# Patient Record
Sex: Female | Born: 1985
Health system: Southern US, Community
[De-identification: ages and names within clinical notes are randomized; demographics above are authoritative.]

## PROBLEM LIST (undated history)

## (undated) ENCOUNTER — Inpatient Hospital Stay (HOSPITAL_COMMUNITY): Payer: Self-pay

## (undated) ENCOUNTER — Inpatient Hospital Stay (HOSPITAL_COMMUNITY): Payer: BC Managed Care – PPO

## (undated) DIAGNOSIS — F32A Depression, unspecified: Secondary | ICD-10-CM

## (undated) DIAGNOSIS — I499 Cardiac arrhythmia, unspecified: Secondary | ICD-10-CM

## (undated) DIAGNOSIS — R112 Nausea with vomiting, unspecified: Secondary | ICD-10-CM

## (undated) DIAGNOSIS — Z9889 Other specified postprocedural states: Secondary | ICD-10-CM

## (undated) DIAGNOSIS — I951 Orthostatic hypotension: Secondary | ICD-10-CM

## (undated) DIAGNOSIS — R000 Tachycardia, unspecified: Secondary | ICD-10-CM

## (undated) DIAGNOSIS — I498 Other specified cardiac arrhythmias: Secondary | ICD-10-CM

## (undated) DIAGNOSIS — Z87442 Personal history of urinary calculi: Secondary | ICD-10-CM

## (undated) DIAGNOSIS — R202 Paresthesia of skin: Secondary | ICD-10-CM

## (undated) DIAGNOSIS — Z8489 Family history of other specified conditions: Secondary | ICD-10-CM

## (undated) DIAGNOSIS — F329 Major depressive disorder, single episode, unspecified: Secondary | ICD-10-CM

## (undated) DIAGNOSIS — R51 Headache: Secondary | ICD-10-CM

## (undated) DIAGNOSIS — K219 Gastro-esophageal reflux disease without esophagitis: Secondary | ICD-10-CM

## (undated) DIAGNOSIS — N200 Calculus of kidney: Secondary | ICD-10-CM

## (undated) DIAGNOSIS — F419 Anxiety disorder, unspecified: Secondary | ICD-10-CM

## (undated) DIAGNOSIS — L570 Actinic keratosis: Secondary | ICD-10-CM

## (undated) DIAGNOSIS — G90A Postural orthostatic tachycardia syndrome (POTS): Secondary | ICD-10-CM

## (undated) DIAGNOSIS — B009 Herpesviral infection, unspecified: Secondary | ICD-10-CM

## (undated) HISTORY — DX: Anxiety disorder, unspecified: F41.9

## (undated) HISTORY — DX: Herpesviral infection, unspecified: B00.9

## (undated) HISTORY — DX: Orthostatic hypotension: I95.1

## (undated) HISTORY — DX: Depression, unspecified: F32.A

## (undated) HISTORY — PX: NO PAST SURGERIES: SHX2092

## (undated) HISTORY — DX: Actinic keratosis: L57.0

## (undated) HISTORY — DX: Tachycardia, unspecified: R00.0

## (undated) HISTORY — DX: Other specified cardiac arrhythmias: I49.8

## (undated) HISTORY — DX: Postural orthostatic tachycardia syndrome (POTS): G90.A

## (undated) HISTORY — PX: BREAST ENHANCEMENT SURGERY: SHX7

## (undated) HISTORY — DX: Headache: R51

## (undated) HISTORY — DX: Major depressive disorder, single episode, unspecified: F32.9

## (undated) HISTORY — DX: Calculus of kidney: N20.0

---
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
## 2006-01-23 ENCOUNTER — Other Ambulatory Visit: Admission: RE | Admit: 2006-01-23 | Discharge: 2006-01-23 | Payer: Self-pay | Admitting: Obstetrics and Gynecology

## 2006-07-13 ENCOUNTER — Inpatient Hospital Stay (HOSPITAL_COMMUNITY): Admission: AD | Admit: 2006-07-13 | Discharge: 2006-07-15 | Payer: Self-pay | Admitting: Obstetrics and Gynecology

## 2009-06-02 ENCOUNTER — Encounter: Payer: Self-pay | Admitting: Cardiology

## 2009-06-06 ENCOUNTER — Encounter: Payer: Self-pay | Admitting: Cardiology

## 2009-10-31 ENCOUNTER — Encounter: Payer: Self-pay | Admitting: Cardiology

## 2010-08-13 ENCOUNTER — Encounter: Payer: Self-pay | Admitting: Cardiology

## 2010-08-20 ENCOUNTER — Encounter: Payer: Self-pay | Admitting: Cardiology

## 2010-08-28 ENCOUNTER — Encounter: Payer: Self-pay | Admitting: Cardiology

## 2010-08-30 ENCOUNTER — Encounter: Payer: Self-pay | Admitting: Cardiology

## 2010-08-31 ENCOUNTER — Ambulatory Visit: Payer: Self-pay | Admitting: Cardiology

## 2010-08-31 DIAGNOSIS — R000 Tachycardia, unspecified: Secondary | ICD-10-CM | POA: Insufficient documentation

## 2010-09-04 ENCOUNTER — Encounter: Payer: Self-pay | Admitting: Cardiology

## 2010-09-14 ENCOUNTER — Ambulatory Visit (HOSPITAL_COMMUNITY)
Admission: RE | Admit: 2010-09-14 | Discharge: 2010-09-14 | Payer: Self-pay | Source: Home / Self Care | Attending: Cardiology | Admitting: Cardiology

## 2010-09-14 ENCOUNTER — Ambulatory Visit: Payer: Self-pay | Admitting: Cardiology

## 2010-09-14 ENCOUNTER — Ambulatory Visit: Payer: Self-pay

## 2010-09-14 ENCOUNTER — Encounter: Payer: Self-pay | Admitting: Cardiology

## 2010-09-14 DIAGNOSIS — I951 Orthostatic hypotension: Secondary | ICD-10-CM | POA: Insufficient documentation

## 2010-09-14 DIAGNOSIS — J069 Acute upper respiratory infection, unspecified: Secondary | ICD-10-CM | POA: Insufficient documentation

## 2010-10-15 ENCOUNTER — Encounter: Payer: Self-pay | Admitting: Cardiology

## 2010-10-17 ENCOUNTER — Ambulatory Visit
Admission: RE | Admit: 2010-10-17 | Discharge: 2010-10-17 | Payer: Self-pay | Source: Home / Self Care | Attending: Cardiology | Admitting: Cardiology

## 2010-10-30 NOTE — Letter (Signed)
Summary: [facility]  [facility]   Imported By: Marylou Mccoy 09/07/2010 17:01:39  _____________________________________________________________________  External Attachment:    Type:   Image     Comment:   External Document

## 2010-10-30 NOTE — Letter (Signed)
Summary: [name]  [facility]   Imported By: Marylou Mccoy 09/07/2010 17:02:07  _____________________________________________________________________  External Attachment:    Type:   Image     Comment:   External Document

## 2010-10-30 NOTE — Assessment & Plan Note (Signed)
Summary: np6/sinus tachycardia - appt is 1:45. gd   Visit Type:  Initial Consult Primary Zaniya Mcaulay:  Paulene Floor, NP  CC:  Tachycardia.  History of Present Illness: The patient presents for evaluation of tachycardia and fluctuating blood pressures.  She has no prior cardiac history. She has had a history of migraines and saw Dr. love a couple of years ago. She has had recurrence of headaches over the past few weeks with neck pain. She has seen a chiropractor and has had some manipulations. She's been told she might have compression fractures. She was given a test where she turned her head to the right and became quite dizzy. She has also had problems with resting tachycardia. Her heart rate at home has been in the 120s in the doctor's office 135.  I do have this EKG which confirms sinus tachycardia. I also had some strips from a monitor she's been wearing for 2 weeks which confirms sinus tachycardia. She has been having some lightheadedness and orthostatic symptoms. She has noticed a change in her heart rate with positions. She has been weak. She has had diaphoresis. She has had weight loss. She has had cold extremities. She has not described chest pressure, PND or orthopnea. I did review and called the office today to find out that TSH T4 and T3 done yesterday were normal.  Current Medications (verified): 1)  Flexeril 5 Mg Tabs (Cyclobenzaprine Hcl) .... At Bedtime 2)  Ibuprofen 800 Mg Tabs (Ibuprofen) .... As Needed  Allergies (verified): No Known Drug Allergies  Past History:  Past Medical History: Nephrolithiasis Concussion  Past Surgical History: calf none  Family History: Negative for early heart disease, sudden cardiac death, cardiomyopathy  Social History: Patient is married. She has 2 children. She occasionally smokes cigarettes and occasionally drinks alcohol.  Vital Signs:  Patient profile:   25 year old female Height:      62 inches Weight:      162 pounds BMI:      29.74 Pulse rate:   107 / minute Pulse (ortho):   119 / minute BP sitting:   112 / 74  (left arm) BP standing:   118 / 79 Cuff size:   regular  Vitals Entered By: Caralee Ates CMA (August 31, 2010 1:31 PM)  Serial Vital Signs/Assessments:  Time      Position  BP       Pulse  Resp  Temp     By           Lying RA  123/79   [address], CNA           Sitting   121/81   [address], CNA           Standing  118/79   [address] CNA 3 Min     Standing  112/75   119                   [address], CNA 5 Min     Standing  116/78   114                   [address]  [place], CNA  Comments: Lying- 'Room Spinning' Sitting- Lightheaded Standing- No complaints By: Marrion Coy, CNA  3 Min 3 Min Standing- No complaints By: Marrion Coy, CNA  5 Min 5 MIN- No comlaints By: Marrion Coy, CNA    Physical Exam  General:  Well developed, well nourished, in no acute distress. Head:  normocephalic and atraumatic Eyes:  PERRLA/EOM intact; conjunctiva and lids normal. Mouth:  Teeth, gums and palate normal. Oral mucosa normal. Neck:  Neck supple, no JVD. No masses, thyromegaly or abnormal cervical nodes. Chest Wall:  no deformities or breast masses noted Lungs:  Clear bilaterally to auscultation and percussion. Abdomen:  Bowel sounds positive; abdomen soft and non-tender without masses, organomegaly, or hernias noted. No hepatosplenomegaly. Msk:  Back normal, normal gait. Muscle strength and tone normal. Extremities:  No clubbing or cyanosis. Neurologic:  Alert and oriented x 3. Skin:  Intact without lesions or rashes. Cervical Nodes:  no significant adenopathy Axillary Nodes:  no significant adenopathy Inguinal Nodes:  no significant adenopathy Psych:  Normal affect.   Detailed Cardiovascular Exam  Neck    Carotids: Carotids full and equal bilaterally without bruits.      Neck Veins: Normal, no JVD.    Heart     Inspection: no deformities or lifts noted.      Palpation: normal PMI with no thrills palpable.      Auscultation: regular rate and rhythm, S1, S2 without murmurs, rubs, gallops, or clicks.    Vascular    Abdominal Aorta: no palpable masses, pulsations, or audible bruits.      Femoral Pulses: normal femoral pulses bilaterally.      Pedal Pulses: normal pedal pulses bilaterally.      Radial Pulses: normal radial pulses bilaterally.      Peripheral Circulation: no clubbing, cyanosis, or edema noted with normal capillary refill.     Impression & Recommendations:  Problem # 1:  UNSPECIFIED TACHYCARDIA (ICD-785.0) The patient may have postural orthostatic tachycardia syndrome. I have discussed first dietary changes to include increase salt and fluid. I'm going to check an echocardiogram. I will try to review all available rhythm strips at the end of the 21 day monitor. The next step would be a pregnancy test just to be sure that she has not q.d. I will also review other labs that hopefully will include electrolytes and a CBC. Provided she doesn't have significant improvement with dietary changes along with my next try fludrocortisone or beta blocker. Other medical therapies could be considered as well as tilt table testing. Orders: EKG w/ Interpretation (93000) Echocardiogram (Echo)  Patient Instructions: 1)  Your physician recommends that you schedule a follow-up appointment  with Dr Antoine Poche about 10 days 2)   Your physician recommends that you continue on your current medications as directed. Please refer to the Current Medication list given to you today. 3)  Your physician has requested that you have an echocardiogram.  Echocardiography is a painless test that uses sound waves to create images of your heart. It provides your doctor with information about the size and shape of your heart and how well your heart's chambers and valves are working.  This procedure takes approximately one hour.  There are no restrictions for this procedure. 4)  Increase salt and fluid intake.

## 2010-10-30 NOTE — Procedures (Signed)
Summary: Summary Report  Summary Report   Imported By: Erle Crocker 09/07/2010 15:01:00  _____________________________________________________________________  External Attachment:    Type:   Image     Comment:   External Document

## 2010-11-01 NOTE — Assessment & Plan Note (Signed)
Summary: [facility]   Visit Type:  Follow-up Primary Provider:  Paulene Floor, NP  CC:  Tachycardia.  History of Present Illness: The patient presents for followup of the above. At the last appointment I started her on fludrocortisone for treatment of postural orthostatic tachycardia. She is on a low dose of this and seems to be doing much better. Her heart rate today is much better. She has backed off on the fluids that she was drinking and still she is having less lightheadedness. Her heart rate was still race occasionally but not as frequently as previous. She has had no presyncope or syncope. She has had no chest pressure, neck or arm discomfort.  Current Medications (verified): 1)  Ibuprofen 800 Mg Tabs (Ibuprofen) .... As Needed 2)  Fludrocortisone Acetate 0.1 Mg Tabs (Fludrocortisone Acetate) .... One Daily  Allergies (verified): No Known Drug Allergies  Past History:  Past Medical History: Reviewed history from 08/31/2010 and no changes required. Nephrolithiasis Concussion  Review of Systems       As stated in the HPI and negative for all other systems.   Vital Signs:  Patient profile:   25 year old female Height:      62 inches Weight:      170 pounds BMI:     31.21 Pulse rate:   68 / minute Pulse (ortho):   76 / minute Resp:     16 per minute BP sitting:   122 / 68  (right arm) BP standing:   102 / 72  Vitals Entered By: Marrion Coy, CNA (October 17, 2010 1:17 PM)  Serial Vital Signs/Assessments:  Time      Position  BP       Pulse  Resp  Temp     By           Lying RA  102/72   66                    Marrion Coy, CNA           Sitting   108/78   [address] CNA           Standing  102/72   [address], CNA 3 min     Standing  96/64    80                    [address] CNA 5 Min     Standing  90/66    77                    [address], CNA  Comments: LYING- No complaints SITTING-  Lightheaded STANDING- No complaints By: Marrion Coy, CNA  3 min 3 Min standing- no complaints By: Marrion Coy, CNA  5 Min 5 Min standing- No complaints By: Marrion Coy, CNA    Physical Exam  General:  Well developed, well nourished, in no acute distress. Head:  normocephalic and atraumatic Eyes:  PERRLA/EOM intact; conjunctiva and lids normal. Mouth:  Teeth, gums and palate normal. Oral mucosa normal. Neck:  Neck supple, no JVD. No masses, thyromegaly or abnormal cervical nodes. Chest Wall:  no deformities or breast masses noted Lungs:  Clear bilaterally to auscultation and percussion. Abdomen:  Bowel sounds positive; abdomen soft and non-tender without masses, organomegaly, or hernias noted. No hepatosplenomegaly. Msk:  Back normal, normal gait. Muscle strength and tone normal. Extremities:  No clubbing or cyanosis. Neurologic:  Alert and oriented x 3. Skin:  Intact without lesions or rashes. Cervical Nodes:  no significant adenopathy Inguinal Nodes:  no significant adenopathy Psych:  Normal affect.   Detailed Cardiovascular Exam  Neck    Carotids: Carotids full and equal bilaterally without bruits.      Neck Veins: Normal, no JVD.    Heart    Inspection: no deformities or lifts noted.      Palpation: normal PMI with no thrills palpable.      Auscultation: regular rate and rhythm, S1, S2 without murmurs, rubs, gallops, or clicks.    Vascular    Abdominal Aorta: no palpable masses, pulsations, or audible bruits.      Femoral Pulses: normal femoral pulses bilaterally.      Pedal Pulses: normal pedal pulses bilaterally.      Radial Pulses: normal radial pulses bilaterally.      Peripheral Circulation: no clubbing, cyanosis, or edema noted with normal capillary refill.     Impression & Recommendations:  Problem # 1:  UNSPECIFIED TACHYCARDIA (ICD-785.0) The patient has repsonded well clincally to the low dose of the mineralocorticoid.  She will continue with  this.  She understands that this is a Class C medication with respect to pregnancy and she should let me know as soon as (if) she gets pregnant in the future.  Patient Instructions: 1)  Your physician recommends that you schedule a follow-up appointment in: 6 months with Dr Antoine Poche 2)  Your physician recommends that you continue on your current medications as directed. Please refer to the Current Medication list given to you today.

## 2010-11-01 NOTE — Miscellaneous (Signed)
  Clinical Lists Changes  Observations: Added new observation of ECHOINTERP: - Left ventricle: The cavity size was normal. Systolic function was       normal. The estimated ejection fraction was in the range of 60% to       65%. Wall motion was normal; there were no regional wall motion       abnormalities. Doppler parameters are consistent with abnormal       left ventricular relaxation (grade 1 diastolic dysfunction).     - Atrial septum: No defect or patent foramen ovale was identified. (09/14/2010 12:01)      Echocardiogram  Procedure date:  09/14/2010  Findings:      - Left ventricle: The cavity size was normal. Systolic function was       normal. The estimated ejection fraction was in the range of 60% to       65%. Wall motion was normal; there were no regional wall motion       abnormalities. Doppler parameters are consistent with abnormal       left ventricular relaxation (grade 1 diastolic dysfunction).     - Atrial septum: No defect or patent foramen ovale was identified.

## 2010-11-01 NOTE — Assessment & Plan Note (Signed)
Summary: F/U ECHO AT 3PM TODAY/PER DR [facility] HIMSELF ON 08-31-10 @ 3:...   Visit Type:  Follow-up Primary Provider:  Paulene Floor, NP  CC:  Tachycardia.  History of Present Illness: The patient presents for followup of symptoms consistent with postural orthostatic tachycardia syndrome. At the first visit she wore an event monitor.  Results of this are pending.  She had an echocardiogram today which demonstrates well-preserved ejection fraction with no significant valvular abnormalities. Blood work at her primary care office included a normal TSH and negative pregnancy test. Of note her liver enzymes have been mildly elevated and are being followed by her primary providers. Following our initial appointment I suggested increased salt and fluid intake and she has been doing this rigorously with a Gatorade.  She said this is helping with headaches and some of her lightheadedness. He still has some resting tachycardia. She is not having chest pressure, neck or arm discomfort. She is not having PND or orthopnea. His had no weight gain or edema.  Current Medications (verified): 1)  Ibuprofen 800 Mg Tabs (Ibuprofen) .... As Needed  Allergies (verified): No Known Drug Allergies  Past History:  Past Medical History: Reviewed history from 08/31/2010 and no changes required. Nephrolithiasis Concussion  Past Surgical History: None  Review of Systems       The patient has had an earache and sore throat for the past 3 - 4 days.   Vital Signs:  Patient profile:   25 year old female Height:      62 inches Weight:      161 pounds BMI:     29.55 Pulse rate:   90 / minute Resp:     16 per minute BP sitting:   132 / 68  (right arm)  Vitals Entered By: Marrion Coy, CNA (September 14, 2010 4:07 PM)  Physical Exam  General:  Well developed, well nourished, in no acute distress. Head:  normocephalic and atraumatic Ears:  Right TM slightly red and opacified. Mouth:  Teeth, gums and palate  normal. Oropharyngial erythema. Neck:  Neck supple, no JVD. No masses, thyromegaly or abnormal cervical nodes. Chest Wall:  no deformities or breast masses noted Lungs:  Clear bilaterally to auscultation and percussion. Abdomen:  Bowel sounds positive; abdomen soft and non-tender without masses, organomegaly, or hernias noted. No hepatosplenomegaly. Msk:  Back normal, normal gait. Muscle strength and tone normal. Extremities:  No clubbing or cyanosis. Neurologic:  Alert and oriented x 3. Skin:  Intact without lesions or rashes. Cervical Nodes:  no significant adenopathy Psych:  Normal affect.   Detailed Cardiovascular Exam  Neck    Carotids: Carotids full and equal bilaterally without bruits.      Neck Veins: Normal, no JVD.    Heart    Inspection: no deformities or lifts noted.      Palpation: normal PMI with no thrills palpable.      Auscultation: regular rate and rhythm, S1, S2 without murmurs, rubs, gallops, or clicks.    Vascular    Abdominal Aorta: no palpable masses, pulsations, or audible bruits.      Femoral Pulses: normal femoral pulses bilaterally.      Pedal Pulses: normal pedal pulses bilaterally.      Radial Pulses: normal radial pulses bilaterally.      Peripheral Circulation: no clubbing, cyanosis, or edema noted with normal capillary refill.     Impression & Recommendations:  Problem # 1:  ORTHOSTATIC HYPOTENSION (ICD-458.0) I believe the patient has POTS.  She  did respond favorably to increase hydration and salt. However, she is having drink significant quantities of Gatorade.  At this point I will start a low dose of Florinef and titrate upward as needed.  Problem # 2:  URI (ICD-465.9) She did have evidence of erythema of her TM and oropharynx. I prescribed a Z-Pak.  Patient Instructions: 1)  Your physician recommends that you schedule a follow-up appointment as needed  2)  Your physician has recommended you make the following change in your medication:  start Fludrocortizone 0.1 mg a day, take Z-pack once a day for 3 days Prescriptions: ZITHROMAX Z-PAK 250 MG TABS (AZITHROMYCIN) as directed  #1 x 0   Entered by:   Charolotte Capuchin, RN   Authorized by:   Rollene Rotunda, MD, [facility]   Signed by:   Charolotte Capuchin, RN on 09/14/2010   Method used:   Electronically to        [facility]  [address] (retail)       [address]       [place], [place]  [address]       Ph: [phone]       Fax: [phone]   RxID:   [id number] FLUDROCORTISONE ACETATE 0.1 MG TABS (FLUDROCORTISONE ACETATE) one daily  #30 x 11   Entered by:   Charolotte Capuchin, RN   Authorized by:   Rollene Rotunda, MD, [facility]   Signed by:   Charolotte Capuchin, RN on 09/14/2010   Method used:   Electronically to        [facility]  [address] (retail)       [address]       [place], [place]  [address]       Ph: [phone]       Fax: [phone]   RxID:   [id number]  I have reviewed and approved all prescriptions at the time of this visit. Rollene Rotunda, MD, [facility]  September 14, 2010 6:08 PM

## 2011-02-15 NOTE — Discharge Summary (Signed)
[name], [name] NO.:  [id number]   MEDICAL RECORD NO.:  [id number]          PATIENT TYPE:  INP   LOCATION:  9120                          FACILITY:  [facility]   PHYSICIAN:  Huel Cote, M.D. DATE OF BIRTH:  11-04-1985   DATE OF ADMISSION:  12/12/2004  DATE OF DISCHARGE:  12/14/2004                                 DISCHARGE SUMMARY   DISCHARGE DIAGNOSES:  1.  Term pregnancy at 39+ weeks, delivered.  2.  Status post normal spontaneous vaginal delivery.  3.  Status post nephrolithiasis in pregnancy.  4.  Herpes simplex virus during pregnancy, none at delivery.   DISCHARGE MEDICATIONS:  1.  Motrin 600 mg p.o. every six hours.  2.  Percocet one to two tablets p.o. every four hours p.r.n.   FOLLOW UP:  The patient is to follow up in six weeks for her routine  postpartum exam and determine birth control at that time.   HOSPITAL COURSE:  The patient is a 25 year old G1, P0 who was admitted at  39+ weeks with complaint of regular contractions.  She had documented  cervical change from 2 to 3 cm with observation and therefore was kept in  labor.  Her prenatal care had been complicated by recurrent nephrolithiasis  for which she was followed by Dr. Earlene Plater of urology.  Also, she had a  positive HSV-1 culture on her genitalia at 34 weeks; however, she was placed  on Valtrex and had no recurrent lesions since that time.   PRENATAL LABORATORY DATA:  O positive, antibody screen negative, rubella  immune, hepatitis B surface antigen negative, RPR nonreactive, HIV negative,  GC negative, Chlamydia negative, group B strep negative.  One-hour Glucola  147, three-hour Glucola normal.   PAST OBSTETRICAL HISTORY:  None.   PAST MEDICAL HISTORY:  Asthma.   PAST GYNECOLOGICAL HISTORY:  None.   PAST SURGICAL HISTORY:  None.   ALLERGIES:  NONE.   MEDICATIONS:  Valtrex.   HOSPITAL COURSE:  Upon admission, she was febrile with stable vital signs.  Fetal heart rate was  reassuring but had slightly decreased variability  secondary to receiving Stadol.  Upon my exam after admission, she was 90,  4+, and -1 station and had rupture of membranes performed at that time of  clear fluid.  She continued to progress quickly and reached complete  dilation and pushed well; however the fetal heart rate was noted to drop to  the 70s with pushing, and after five to six minutes, had very poor recovery  noted.  At this point, the vertex was at a +3 station; however, decision was  made to proceed with vacuum-assisted delivery, given the fetal heart  deceleration.  A soft-cup Mityvac was applied without difficulty, and the  vertex was pulled to crowning with one contraction.  The patient then pushed  out the remainder of the infant without difficulty.  There was a nuchal cord  x1 which reduced.  Apgars were 8 and 9.  Weight was 8 pounds even of a  vigorous female infant.  There was no difficult with delivery of the shoulders  or body.  The placenta delivered spontaneously.  The patient had a second-  degree laceration which was repaired with 2-0 Vicryl.  The cervix and rectum  were intact.  She then did well, and on postpartum day #2 was felt stable  for discharge home.      KR/MEDQ  D:  12/14/2004  T:  12/14/2004  Job:  [id number]

## 2011-02-15 NOTE — Discharge Summary (Signed)
NAMEWRENN, WILLCOX NO.:  [id number]   MEDICAL RECORD NO.:  [id number]          PATIENT TYPE:  INP   LOCATION:  9109                          FACILITY:  [facility]   PHYSICIAN:  Malachi Pro. Ambrose Mantle, M.D. DATE OF BIRTH:  02/06/1986   DATE OF ADMISSION:  09/17/2004  DATE OF DISCHARGE:                                 DISCHARGE SUMMARY   HOSPITAL COURSE:  A 25 year old white female at 27+ weeks gestation with [facility]  December 17, 2004 admitted to observation from the office where she presented  with severe right flank pain and some nausea; positive fetal movement; no  vaginal bleeding; no diarrhea or fever.  The patient had had two episodes of  similar pain within the past one-and-a-half months which were treated as  probable nephrolithiasis with pain medications and hydration, with the pain  resolving after 24-48 hours each time.  The patient stated the pain was  sharp in nature.  Her past medical history revealed asthma but it was  stable.  No surgical history or OB history, no GYN history.  Medications:  Percocet.  She was allergic to LATEX.  Physical exam on admission:  Normal  vital signs.  Heart normal size and sounds, lungs clear, abdomen gravid and  nontender, fundal height 27 cm, mild right CVA tenderness.  The patient was  noted to have an elevated white count of 16,000 and a creatinine of 1.2.  Dr. Senaida Ores admitted her to obtain a renal ultrasound and control her  pain.  The renal sonogram showed a right renal dilated pelvis.  Ureteral  jets were unable to be seen well secondary to an empty bladder.  On hospital  day #2 the patient complained of persistent pain as the medications would  wear off.  There was some nausea without emesis.  The patient stated her  pain was worse with movement and raising her legs.  Her white count  decreased from 16,200 to 12,000.  Creatinine remained at 1.2.  The patient  was seen in consultation by urology and her evaluation was that the  patient  probably had hydronephrosis of the right kidney secondary to a probable  stone.  She recommended managing the patient expectantly with pain  medicines, but if she became febrile or if the pain worsened, would probably  need to place a stent in the right ureter.  She suggested general surgical  consultation, but after talking with Dr. Earlene Plater and Dr. Senaida Ores, Dr.  Senaida Ores felt like the patient did not need a general surgical  consultation.  The ultrasound showed moderate pelvicaliceal dilatation on  the right with no associated ureterectasis.  This was markedly asymmetric  with the left side and more prominent that would be expected at [redacted] weeks  gestational age.  This was actually 27 weeks but is written as 24 weeks.  Evaluation for patency of the right ureter could not be undertaken at the  time of scanning due to incomplete bladder distention.  Dr. Kyung Rudd  recommended follow-up evaluation at a later point to assess for ureteral  jets, given today's scan findings.  LABORATORY DATA:  Showed a urinalysis without any hematuria.  It was straw  colored, a pH of 7.5, specific gravity of 1.015, negative for blood and  protein, had 15% ketones.  There were no leukocytes.  The patient's  comprehensive metabolic profile was normal except for creatinine of 1.2 that  was present on two determinations.  The white count on admission was 16,200;  it reduced to 12,000.  Hemoglobin 12.1 on admission, reduced to 10.9.   FINAL DIAGNOSES:  1.  Right flank pain, probably secondary to right nephrolithiasis.  2.  Intrauterine pregnancy at 27 weeks.   The patient is discharged on Percocet 5/325 #30 tablets one or two q.4-6h.  as needed for pain.  She is to follow up in our office in 1 week and also to  follow up with urology in 1 week.  If she becomes febrile or if she starts  having increasing pain, she is advised to call us for further evaluation.     Scharlene Corn   TFH/MEDQ  D:  09/18/2004  T:   09/19/2004  Job:  [id number]

## 2011-02-15 NOTE — Discharge Summary (Signed)
[name], [name]               ACCOUNT NO.:  [id number]   MEDICAL RECORD NO.:  [id number]          PATIENT TYPE:  INP   LOCATION:  9107                          [facility]ACILITY:  [facility]   PHYSICIAN:  Sherron Monday, MD        DATE OF BIRTH:  02-27-1986   DATE OF ADMISSION:  07/13/2006  DATE OF DISCHARGE:  07/15/2006                                 DISCHARGE SUMMARY   ADMITTING DIAGNOSIS:  Intrauterine pregnancy at term, laboring.   DISCHARGE DIAGNOSES:  Intrauterine pregnancy, delivered by spontaneous  vaginal delivery.   HISTORY OF PRESENT ILLNESS:  A 25 year old, G2, P1-0-0-1, at 77 and 4 with  contractions every 3-5 minutes at home.  Cervical change noted in MAU.  No  loss of fluid.  No vaginal bleeding.  Good fetal movement.   REVIEW OF SYSTEMS:  Negative for nausea, vomiting, negative for diarrhea or  constipation, negative for fever or chills, negative for headache.   PAST MEDICAL HISTORY:  Not significant.   PAST SURGICAL HISTORY:  Not significant.   OBSTETRICAL/GYNECOLOGIC HISTORY:  1. G1 is a term spontaneous vaginal delivery without complications.  2. G2 is at present.  3. No abnormal Pap smears.  4. She has a history of herpes.   MEDICATIONS:  She is taking Valtrex for suppression.   ALLERGIES:  NO KNOWN DRUG ALLERGIES, HOWEVER, SHE IS ALLERGIC TO LATEX.   SOCIAL HISTORY:  She denies any alcohol, tobacco, or drugs.  She is married  and works as a Lawyer.   FAMILY HISTORY:  Negative for diabetes or hypertension.  Positive for  pancreatic cancer in maternal grandfather.  No coronary artery disease.   PRENATAL LABORATORY:  Hemoglobin 13.3, platelets 319,000.  O positive.  CF  screen negative.  Gonorrhea negative.  Chlamydia negative.  RPR nonreactive.  Rubella immune.  Cystic fibrosis negative.  Hepatitis B surface antigen  negative.  Glucola 114.  Group B strep negative.  An ultrasound performed on  February 28, 2006, at 19 weeks confirmed an [facility] of July 23, 2006, normal  anatomy, and posterior placenta.   Artifical rupture of membranes was performed shortly after admission for  clear fluid.  Her labor was relatively uncomplicated.  She progressed to  complete complete +2 and pushed for  delivery a viable female infant at 12:33  with Apgar's of 8 at one minute and 9 at five minutes and a weight of 8  pounds 9 ounces.  The placenta was delivered intact over a second degree  perineal laceration as well as small periurethral laceration, were repaired  with 3-0 Vicryl in a normal fashion.  Her postpartum course was relatively  uncomplicated.  She remained afebrile with stable vital signs throughout.   She was discharged to home on postpartum day #2 with a prescription for  Motrin as well as prenatal vitamins.  She plans to breast feed.  She is O  positive.  Rubella immune.  And, we will get an IUD at her followup.  She  will follow up with [place] in approximately 6 weeks and then have her IUD placed  after  that.  She was discharged to home with appropriate discharge  instructions and numbers to call with any questions or problems.  She voiced  understanding of all this.      Sherron Monday, MD  Electronically Signed     JB/MEDQ  D:  07/15/2006  T:  07/15/2006  Job:  [id number]

## 2011-04-08 ENCOUNTER — Telehealth: Payer: Self-pay | Admitting: Cardiology

## 2011-04-08 NOTE — Telephone Encounter (Signed)
LMTC

## 2011-04-08 NOTE — Telephone Encounter (Signed)
Pt trying to get some information about a medication pt is taking, pt just found out she is pregnant.

## 2011-04-08 NOTE — Telephone Encounter (Signed)
Pt is expecting. Wants discuss medication.

## 2011-04-08 NOTE — Telephone Encounter (Signed)
Spoke with Kennon Rounds, pharmacist and she said that FLUDROCORTISONE ACETATE was a class C medication in [place] but was studied in [place] and is considered a class A (safe for pregnancy.) She is expecting and is wondering if she needs to stop the medication. Her OBGYN advised her to call us. Informed her to continue the medication, I will send to Dr. Antoine Poche to review.

## 2011-04-08 NOTE — Telephone Encounter (Signed)
Left message to call back  

## 2011-04-10 NOTE — Telephone Encounter (Signed)
I would suggest that she stop the medication and follow conservative measures for now such as fluid restriction and compression stockings.  We could reconsider the meds if she has severe, intractable symptoms.

## 2011-04-10 NOTE — Telephone Encounter (Signed)
Pt aware she should stop medication and wear compression stockings.  She should take her BP and HR if she feels dizziness etc.  She does state that she will not wear compression stockings but will call if problems.  An appointment was scheduled for her to follow up as she was due to be seen in July.

## 2011-04-18 ENCOUNTER — Encounter: Payer: Self-pay | Admitting: Cardiology

## 2011-04-22 ENCOUNTER — Encounter: Payer: Self-pay | Admitting: Cardiology

## 2011-04-22 ENCOUNTER — Ambulatory Visit (INDEPENDENT_AMBULATORY_CARE_PROVIDER_SITE_OTHER): Payer: BC Managed Care – PPO | Admitting: Cardiology

## 2011-04-22 VITALS — BP 116/83 | HR 81 | Ht 62.0 in | Wt 171.0 lb

## 2011-04-22 DIAGNOSIS — I951 Orthostatic hypotension: Secondary | ICD-10-CM

## 2011-04-22 NOTE — Assessment & Plan Note (Signed)
At this point I would like to avoid any additional medications if her symptoms can be managed during with pregnancy with volume and salt expansion.  We discussed this at length.  No change in therapy is needed.

## 2011-04-22 NOTE — Progress Notes (Signed)
HPI Patient presents for followup. Since I last saw her she has had no new symptoms. She actually been doing well taking fludrocortisone. However, she called yesterday saying that she was pregnant. I did discontinue this medication. She's had no severe symptoms though she had one brief bout of tachycardia while standing on vacation. She denies any chest pressure, neck or arm discomfort. She said no new shortness of breath, PND or orthopnea. She has had no new weight gain or edema.  No Known Allergies  Current Outpatient Prescriptions  Medication Sig Dispense Refill  . escitalopram (LEXAPRO) 10 MG tablet Take 10 mg by mouth daily.        Marland Kitchen ibuprofen (ADVIL,MOTRIN) 800 MG tablet Take 800 mg by mouth every 8 (eight) hours as needed.          Past Medical History  Diagnosis Date  . Nephrolithiasis   . Concussion     No past surgical history on file.  ROS:  As stated in the HPI and negative for all other systems.  PHYSICAL EXAM BP 116/83  Pulse 81  Ht 5\' 2"  (1.575 m)  Wt 171 lb (77.565 kg)  BMI 31.28 kg/m2 GENERAL:  Well appearing HEENT:  Pupils equal round and reactive, fundi not visualized, oral mucosa unremarkable NECK:  No jugular venous distention, waveform within normal limits, carotid upstroke brisk and symmetric, no bruits, no thyromegaly LYMPHATICS:  No cervical, inguinal adenopathy LUNGS:  Clear to auscultation bilaterally BACK:  No CVA tenderness CHEST:  Unremarkable HEART:  PMI not displaced or sustained,S1 and S2 within normal limits, no S3, no S4, no clicks, no rubs, no murmurs ABD:  Flat, positive bowel sounds normal in frequency in pitch, no bruits, no rebound, no guarding, no midline pulsatile mass, no hepatomegaly, no splenomegaly EXT:  2 plus pulses throughout, no edema, no cyanosis no clubbing SKIN:  No rashes no nodules NEURO:  Cranial nerves II through XII grossly intact, motor grossly intact throughout PSYCH:  Cognitively intact, oriented to person place and  time  ASSESSMENT AND PLAN

## 2011-04-22 NOTE — Patient Instructions (Signed)
Follow up in 4 months with Dr Antoine Poche

## 2011-04-22 NOTE — Progress Notes (Deleted)
   Patient ID: [name], female    DOB: 09-05-1986, 25 y.o.   MRN: [id number]  HPI    Review of Systems    Physical Exam

## 2011-05-07 LAB — HEPATITIS B SURFACE ANTIGEN: Hepatitis B Surface Ag: NEGATIVE

## 2011-05-07 LAB — GC/CHLAMYDIA PROBE AMP, GENITAL
Chlamydia: NEGATIVE
Gonorrhea: NEGATIVE

## 2011-05-07 LAB — ABO/RH: RH Type: POSITIVE

## 2011-05-07 LAB — ANTIBODY SCREEN: Antibody Screen: NEGATIVE

## 2011-05-07 LAB — RUBELLA ANTIBODY, IGM: Rubella: IMMUNE

## 2011-05-07 LAB — RPR: RPR: NONREACTIVE

## 2011-05-22 ENCOUNTER — Other Ambulatory Visit (HOSPITAL_COMMUNITY): Payer: Self-pay | Admitting: Obstetrics and Gynecology

## 2011-05-22 DIAGNOSIS — R1011 Right upper quadrant pain: Secondary | ICD-10-CM

## 2011-05-27 ENCOUNTER — Ambulatory Visit (HOSPITAL_COMMUNITY)
Admission: RE | Admit: 2011-05-27 | Discharge: 2011-05-27 | Disposition: A | Discharge status: Home / Self Care | Payer: BC Managed Care – PPO | Source: Ambulatory Visit | Attending: Obstetrics and Gynecology | Admitting: Obstetrics and Gynecology

## 2011-05-27 DIAGNOSIS — R1011 Right upper quadrant pain: Secondary | ICD-10-CM | POA: Insufficient documentation

## 2011-08-27 ENCOUNTER — Encounter: Payer: Self-pay | Admitting: Cardiology

## 2011-08-27 ENCOUNTER — Ambulatory Visit (INDEPENDENT_AMBULATORY_CARE_PROVIDER_SITE_OTHER): Payer: BC Managed Care – PPO | Admitting: Cardiology

## 2011-08-27 VITALS — BP 110/74 | HR 86 | Resp 18 | Ht 64.0 in | Wt 175.8 lb

## 2011-08-27 DIAGNOSIS — I951 Orthostatic hypotension: Secondary | ICD-10-CM

## 2011-08-27 NOTE — Progress Notes (Signed)
   HPI The patient returns for follow up of POTS.  He is now only [redacted] weeks pregnant with a little girl. She has had some palpitations. She's had a few episodes of lightheadedness. She will occasionally have a tachycardia when she stands. However, she's not had any syncope. She gets a little short of breath with activities but is not describing PND or orthopnea. She's had weight gain with pregnancy but no new edema.   No Known Allergies  Current Outpatient Prescriptions  Medication Sig Dispense Refill  . Multiple Vitamins-Minerals (MULTIVITAMIN WITH MINERALS) tablet Take 1 tablet by mouth daily.        Marland Kitchen omeprazole (PRILOSEC) 20 MG capsule Take 20 mg by mouth daily.        Marland Kitchen escitalopram (LEXAPRO) 10 MG tablet Take 10 mg by mouth daily.        Marland Kitchen ibuprofen (ADVIL,MOTRIN) 800 MG tablet Take 800 mg by mouth every 8 (eight) hours as needed.          Past Medical History  Diagnosis Date  . Nephrolithiasis   . Concussion     No past surgical history on file.  ROS:  As stated in the HPI and negative for all other systems.    PHYSICAL EXAM BP 110/74  Pulse 86  Resp 18  Ht 5\' 4"  (1.626 m)  Wt 175 lb 12.8 oz (79.742 kg)  BMI 30.18 kg/m2 GENERAL:  Well appearing NECK:  No jugular venous distention, waveform within normal limits, carotid upstroke brisk and symmetric, no bruits, no thyromegaly LYMPHATICS:  No cervical, inguinal adenopathy LUNGS:  Clear to auscultation bilaterally BACK:  No CVA tenderness CHEST:  Unremarkable HEART:  PMI not displaced or sustained,S1 and S2 within normal limits, no S3, no S4, no clicks, no rubs, no murmurs ABD:  Flat, positive bowel sounds normal in frequency in pitch, no bruits, no rebound, no guarding, no midline pulsatile mass, no hepatomegaly, no splenomegaly, gravid EXT:  2 plus pulses throughout, no edema, no cyanosis no clubbing SKIN:  No rashes no nodules NEURO:  Cranial nerves II through XII grossly intact, motor grossly intact throughout PSYCH:   Cognitively intact, oriented to person place and time  EKG:  Sinus rhythm, rate 87, axis within normal limits, intervals within normal limits, no acute ST-T wave changes.   ASSESSMENT AND PLAN

## 2011-08-27 NOTE — Patient Instructions (Signed)
The current medical regimen is effective;  continue present plan and medications.  Follow up in March 2012

## 2011-08-27 NOTE — Assessment & Plan Note (Signed)
No change in therapy.  She will stay hydrated and keep up her salt intake unless she has increased swelling or dyspnea.

## 2011-10-01 NOTE — L&D Delivery Note (Signed)
Delivery Note She received spinal anesthesia, epidural catheter infiltrated a blood vessel and was not placed.  She progressed to complete and pushed very well.  Ampicillin was in prior to delivery.  At 6:59 AM a viable female was delivered via Vaginal, Spontaneous Delivery (Presentation: Left Occiput Anterior).  APGAR: 9, 10; weight 7 lb 11 oz (3487 g).   Placenta status: Intact, Spontaneous.  Cord: 3 vessels with the following complications: None.   Anesthesia: Spinal  Episiotomy: None Lacerations: 2nd degree Suture Repair: 3.0 vicryl Est. Blood Loss (mL): 300  Mom to postpartum.  Baby to nursery-stable.  Rielly Corlett D 12/12/2011, 7:24 AM

## 2011-11-04 LAB — STREP B DNA PROBE: GBS: POSITIVE

## 2011-11-27 ENCOUNTER — Inpatient Hospital Stay (HOSPITAL_COMMUNITY)
Admission: AD | Admit: 2011-11-27 | Discharge: 2011-11-27 | Disposition: A | Discharge status: Home / Self Care | Payer: BC Managed Care – PPO | Attending: Obstetrics and Gynecology | Admitting: Obstetrics and Gynecology

## 2011-11-27 ENCOUNTER — Encounter (HOSPITAL_COMMUNITY): Payer: Self-pay | Admitting: *Deleted

## 2011-11-27 DIAGNOSIS — O4700 False labor before 37 completed weeks of gestation, unspecified trimester: Secondary | ICD-10-CM | POA: Insufficient documentation

## 2011-11-27 NOTE — Progress Notes (Signed)
Pt given option of ambien and dc home or ambulate and be rechecked.  Pt wishes to ambulated.  Explained waiting on reactive fetal strip.  Pt verbalized understanding.

## 2011-11-27 NOTE — Progress Notes (Signed)
Dr. Ellyn Hack notified of pt presenting for labor check.  Notified of VE 3/60/-2.  Pt may have option to have ambien and dc home, or ambulate for one hour and return to be rechecked.

## 2011-11-27 NOTE — Progress Notes (Signed)
Pt returned from walking at this time.  Monitors applied and assessing.

## 2011-11-27 NOTE — Discharge Instructions (Signed)
Return to MAU with any worsening symptoms.  Call your doctor with any concerns or questions.

## 2011-11-27 NOTE — Progress Notes (Signed)
Dr. Ellyn Hack notified of no cervical change after ambulating.  Ok to [employer] home.

## 2011-12-02 ENCOUNTER — Encounter: Payer: Self-pay | Admitting: Cardiology

## 2011-12-02 ENCOUNTER — Ambulatory Visit (INDEPENDENT_AMBULATORY_CARE_PROVIDER_SITE_OTHER): Payer: BC Managed Care – PPO | Admitting: Cardiology

## 2011-12-02 VITALS — BP 135/90 | HR 60 | Ht 62.0 in | Wt 187.0 lb

## 2011-12-02 DIAGNOSIS — R42 Dizziness and giddiness: Secondary | ICD-10-CM

## 2011-12-02 NOTE — Progress Notes (Signed)
    HPI The patient returns for follow up of POTS.  He is now only [redacted] weeks pregnant with a little girl. She has had some palpitations. She's had a few episodes of lightheadedness.  However, she has had no frank syncope or presyncope. She's not having any chest pressure, neck or arm discomfort. Her pregnancy has been unremarkable. She has had a little more lower extremity edema and she had with other pregnancies however.  No Known Allergies  Current Outpatient Prescriptions  Medication Sig Dispense Refill  . omeprazole (PRILOSEC) 20 MG capsule Take 20 mg by mouth daily.       . ValACYclovir HCl (VALTREX PO) Take 1 tablet by mouth daily.        Past Medical History  Diagnosis Date  . Nephrolithiasis   . Concussion   . Asthma     Past Surgical History  Procedure Date  . No past surgeries     ROS:  As stated in the HPI and negative for all other systems.    PHYSICAL EXAM BP 135/90  Pulse 60  Ht 5\' 2"  (1.575 m)  Wt 187 lb (84.823 kg)  BMI 34.20 kg/m2 GENERAL:  Well appearing NECK:  No jugular venous distention, waveform within normal limits, carotid upstroke brisk and symmetric, no bruits, no thyromegaly LYMPHATICS:  No cervical, inguinal adenopathy LUNGS:  Clear to auscultation bilaterally BACK:  No CVA tenderness CHEST:  Unremarkable HEART:  PMI not displaced or sustained,S1 and S2 within normal limits, no S3, no S4, no clicks, no rubs, no murmurs ABD:  Flat, positive bowel sounds normal in frequency in pitch, no bruits, no rebound, no guarding, no midline pulsatile mass, no hepatomegaly, no splenomegaly, gravid EXT:  2 plus pulses throughout, traceedema, no cyanosis no clubbing   EKG:  Sinus rhythm, rate 60, axis within normal limits, intervals within normal limits, no acute ST-T wave changes. 12/02/2011   ASSESSMENT AND PLAN

## 2011-12-02 NOTE — Patient Instructions (Signed)
The current medical regimen is effective;  continue present plan and medications.  Follow up in 4 months with Dr Hochrein 

## 2011-12-02 NOTE — Assessment & Plan Note (Signed)
She has had some very mild lightheadedness but no severe symptoms. No change in therapy is indicated.

## 2011-12-02 NOTE — Assessment & Plan Note (Signed)
Her tachycardia seems to be slightly improved. She's had no new symptoms. No change in therapy is indicated.

## 2011-12-10 ENCOUNTER — Telehealth (HOSPITAL_COMMUNITY): Payer: Self-pay | Admitting: *Deleted

## 2011-12-10 ENCOUNTER — Encounter (HOSPITAL_COMMUNITY): Payer: Self-pay | Admitting: *Deleted

## 2011-12-10 NOTE — Telephone Encounter (Signed)
Preadmission screen  

## 2011-12-11 ENCOUNTER — Other Ambulatory Visit: Payer: Self-pay | Admitting: Obstetrics and Gynecology

## 2011-12-11 NOTE — H&P (Signed)
  25yo [id number] at 39+ for induction of labor given term status and favorable cervix.  +FM, no LOF, no VB, occ ctx; Pregnancy complicated by +GBBS and POTs, elevated glucola, nl 3 hr gtt.    PMH Kidney stones Migraines Postural Orthostatic Tachycardia HSV 1  PSH none POBGynHx G3P2002 G1 TSVD 8# vacuum assisted, female G22 TSVD 8#9 female G3 present No abnormal pap, no STDs   Meds Valtrex  All NKDA   Latex sens  SH denies ETOH, tobacco or drug use, married  FH Arthritis,Breast Cancer, brain cancer, COPD, CVA, DM, HTN, liver Ca, Migraines, Pancreatic Ca, thyroid disease  PE AFVSS gen NAD CV RRR Lungs CTAB Abd soft, FNT Ext sym, NT  SVE 4cm  O+/Ab Scr neg/Hgb 14.4/Pap WNL/ RI/ RPR NR/Ur Cx + GBBS/ HepBsAG neg/ HIV neg/ Plt 309K/ GC neg/ Chl neg/First Tri Scr and AFP WNL/ CF neg/ glucola 138 - 3hr failed 1 hr, o/w WNL/   [place] dated by 7wk [place] [facility] 3/21 Anat [place] cwd, nl anat, ant plac, female  A&P 25yo G3P2002 at 39+ for IOL given favorable cervix and term status GBBS +, PCN for prophylaxis Epidural prn Pitocin and AROM to augment Expect SVD

## 2011-12-12 ENCOUNTER — Inpatient Hospital Stay (HOSPITAL_COMMUNITY)
Admission: RE | Admit: 2011-12-12 | Discharge: 2011-12-14 | DRG: 775 | Disposition: A | Discharge status: Home / Self Care | Payer: Medicaid Other | Source: Ambulatory Visit | Attending: Obstetrics and Gynecology | Admitting: Obstetrics and Gynecology

## 2011-12-12 ENCOUNTER — Inpatient Hospital Stay (HOSPITAL_COMMUNITY): Payer: Medicaid Other | Admitting: Anesthesiology

## 2011-12-12 ENCOUNTER — Encounter (HOSPITAL_COMMUNITY): Payer: Self-pay

## 2011-12-12 ENCOUNTER — Encounter (HOSPITAL_COMMUNITY): Payer: Self-pay | Admitting: Anesthesiology

## 2011-12-12 DIAGNOSIS — Z22330 Carrier of Group B streptococcus: Secondary | ICD-10-CM

## 2011-12-12 DIAGNOSIS — IMO0001 Reserved for inherently not codable concepts without codable children: Secondary | ICD-10-CM

## 2011-12-12 DIAGNOSIS — O99892 Other specified diseases and conditions complicating childbirth: Secondary | ICD-10-CM | POA: Diagnosis present

## 2011-12-12 LAB — CBC
HCT: 38.6 % (ref 36.0–46.0)
Hemoglobin: 12.9 g/dL (ref 12.0–15.0)
MCH: 27.7 pg (ref 26.0–34.0)
MCHC: 33.4 g/dL (ref 30.0–36.0)
MCV: 83 fL (ref 78.0–100.0)
Platelets: 226 10*3/uL (ref 150–400)
RBC: 4.65 MIL/uL (ref 3.87–5.11)
RDW: 14.2 % (ref 11.5–15.5)
WBC: 14.6 10*3/uL — ABNORMAL HIGH (ref 4.0–10.5)

## 2011-12-12 LAB — RPR: RPR Ser Ql: NONREACTIVE

## 2011-12-12 MED ORDER — TETANUS-DIPHTH-ACELL PERTUSSIS 5-2.5-18.5 LF-MCG/0.5 IM SUSP
0.5000 mL | Freq: Once | INTRAMUSCULAR | Status: AC
  Administered 2011-12-13: 0.5 mL via INTRAMUSCULAR

## 2011-12-12 MED ORDER — PENICILLIN G POTASSIUM 5000000 UNITS IJ SOLR
5.0000 10*6.[IU] | Freq: Once | INTRAVENOUS | Status: DC
  Filled 2011-12-12: qty 5

## 2011-12-12 MED ORDER — OXYCODONE-ACETAMINOPHEN 5-325 MG PO TABS: 1.0000 | ORAL_TABLET | ORAL | Status: DC | PRN

## 2011-12-12 MED ORDER — OXYTOCIN BOLUS FROM INFUSION
500.0000 mL | Freq: Once | INTRAVENOUS | Status: DC
  Filled 2011-12-12: qty 500

## 2011-12-12 MED ORDER — LANOLIN HYDROUS EX OINT: TOPICAL_OINTMENT | CUTANEOUS | Status: DC | PRN

## 2011-12-12 MED ORDER — EPHEDRINE 5 MG/ML INJ: 10.0000 mg | INTRAVENOUS | Status: DC | PRN

## 2011-12-12 MED ORDER — ONDANSETRON HCL 4 MG PO TABS: 4.0000 mg | ORAL_TABLET | ORAL | Status: DC | PRN

## 2011-12-12 MED ORDER — DIPHENHYDRAMINE HCL 50 MG/ML IJ SOLN: 12.5000 mg | INTRAMUSCULAR | Status: DC | PRN

## 2011-12-12 MED ORDER — FENTANYL 2.5 MCG/ML BUPIVACAINE 1/10 % EPIDURAL INFUSION (WH - ANES)
14.0000 mL/h | INTRAMUSCULAR | Status: DC
  Filled 2011-12-12: qty 60

## 2011-12-12 MED ORDER — IBUPROFEN 600 MG PO TABS: 600.0000 mg | ORAL_TABLET | Freq: Four times a day (QID) | ORAL | Status: DC | PRN

## 2011-12-12 MED ORDER — WITCH HAZEL-GLYCERIN EX PADS: 1.0000 "application " | MEDICATED_PAD | CUTANEOUS | Status: DC | PRN

## 2011-12-12 MED ORDER — BENZOCAINE-MENTHOL 20-0.5 % EX AERO
1.0000 "application " | INHALATION_SPRAY | CUTANEOUS | Status: DC | PRN
  Administered 2011-12-13: 1 via TOPICAL

## 2011-12-12 MED ORDER — OXYCODONE-ACETAMINOPHEN 5-325 MG PO TABS
1.0000 | ORAL_TABLET | ORAL | Status: DC | PRN
  Administered 2011-12-12 – 2011-12-13 (×2): 1 via ORAL
  Filled 2011-12-12 (×2): qty 1

## 2011-12-12 MED ORDER — PRENATAL MULTIVITAMIN CH
1.0000 | ORAL_TABLET | Freq: Every day | ORAL | Status: DC
  Administered 2011-12-13 – 2011-12-14 (×2): 1 via ORAL
  Filled 2011-12-12 (×3): qty 1

## 2011-12-12 MED ORDER — MEASLES, MUMPS & RUBELLA VAC ~~LOC~~ INJ
0.5000 mL | INJECTION | Freq: Once | SUBCUTANEOUS | Status: DC
  Filled 2011-12-12: qty 0.5

## 2011-12-12 MED ORDER — OXYTOCIN 20 UNITS IN LACTATED RINGERS INFUSION - SIMPLE: 125.0000 mL/h | INTRAVENOUS | Status: DC | PRN

## 2011-12-12 MED ORDER — LACTATED RINGERS IV SOLN: INTRAVENOUS | Status: DC

## 2011-12-12 MED ORDER — TERBUTALINE SULFATE 1 MG/ML IJ SOLN: 0.2500 mg | Freq: Once | INTRAMUSCULAR | Status: DC | PRN

## 2011-12-12 MED ORDER — ACETAMINOPHEN 325 MG PO TABS: 650.0000 mg | ORAL_TABLET | ORAL | Status: DC | PRN

## 2011-12-12 MED ORDER — DIPHENHYDRAMINE HCL 25 MG PO CAPS: 25.0000 mg | ORAL_CAPSULE | Freq: Four times a day (QID) | ORAL | Status: DC | PRN

## 2011-12-12 MED ORDER — CITRIC ACID-SODIUM CITRATE 334-500 MG/5ML PO SOLN: 30.0000 mL | ORAL | Status: DC | PRN

## 2011-12-12 MED ORDER — MAGNESIUM HYDROXIDE 400 MG/5ML PO SUSP: 30.0000 mL | ORAL | Status: DC | PRN

## 2011-12-12 MED ORDER — PHENYLEPHRINE 40 MCG/ML (10ML) SYRINGE FOR IV PUSH (FOR BLOOD PRESSURE SUPPORT)
80.0000 ug | PREFILLED_SYRINGE | INTRAVENOUS | Status: DC | PRN
  Filled 2011-12-12: qty 5

## 2011-12-12 MED ORDER — FLEET ENEMA 7-19 GM/118ML RE ENEM: 1.0000 | ENEMA | RECTAL | Status: DC | PRN

## 2011-12-12 MED ORDER — ONDANSETRON HCL 4 MG/2ML IJ SOLN: 4.0000 mg | INTRAMUSCULAR | Status: DC | PRN

## 2011-12-12 MED ORDER — PHENYLEPHRINE 40 MCG/ML (10ML) SYRINGE FOR IV PUSH (FOR BLOOD PRESSURE SUPPORT): 80.0000 ug | PREFILLED_SYRINGE | INTRAVENOUS | Status: DC | PRN

## 2011-12-12 MED ORDER — LACTATED RINGERS IV SOLN: 500.0000 mL | Freq: Once | INTRAVENOUS | Status: DC

## 2011-12-12 MED ORDER — METHYLERGONOVINE MALEATE 0.2 MG/ML IJ SOLN
0.2000 mg | INTRAMUSCULAR | Status: DC | PRN
Start: 2011-12-12 — End: 2011-12-14

## 2011-12-12 MED ORDER — PENICILLIN G POTASSIUM 5000000 UNITS IJ SOLR
2.5000 10*6.[IU] | INTRAVENOUS | Status: DC
  Filled 2011-12-12 (×2): qty 2.5

## 2011-12-12 MED ORDER — LIDOCAINE HCL (PF) 1 % IJ SOLN
30.0000 mL | INTRAMUSCULAR | Status: DC | PRN
  Filled 2011-12-12: qty 30

## 2011-12-12 MED ORDER — FENTANYL 2.5 MCG/ML BUPIVACAINE 1/10 % EPIDURAL INFUSION (WH - ANES)
INTRAMUSCULAR | Status: DC | PRN
  Administered 2011-12-12: 3 mL/h via EPIDURAL

## 2011-12-12 MED ORDER — IBUPROFEN 600 MG PO TABS
600.0000 mg | ORAL_TABLET | Freq: Four times a day (QID) | ORAL | Status: DC
  Administered 2011-12-12 – 2011-12-14 (×9): 600 mg via ORAL
  Filled 2011-12-12 (×9): qty 1

## 2011-12-12 MED ORDER — METHYLERGONOVINE MALEATE 0.2 MG PO TABS: 0.2000 mg | ORAL_TABLET | ORAL | Status: DC | PRN

## 2011-12-12 MED ORDER — ONDANSETRON HCL 4 MG/2ML IJ SOLN: 4.0000 mg | Freq: Four times a day (QID) | INTRAMUSCULAR | Status: DC | PRN

## 2011-12-12 MED ORDER — OXYTOCIN 20 UNITS IN LACTATED RINGERS INFUSION - SIMPLE
125.0000 mL/h | Freq: Once | INTRAVENOUS | Status: AC
  Administered 2011-12-12: 125 mL/h via INTRAVENOUS

## 2011-12-12 MED ORDER — ZOLPIDEM TARTRATE 5 MG PO TABS: 5.0000 mg | ORAL_TABLET | Freq: Every evening | ORAL | Status: DC | PRN

## 2011-12-12 MED ORDER — DIBUCAINE 1 % RE OINT: 1.0000 "application " | TOPICAL_OINTMENT | RECTAL | Status: DC | PRN

## 2011-12-12 MED ORDER — OXYTOCIN 20 UNITS IN LACTATED RINGERS INFUSION - SIMPLE
1.0000 m[IU]/min | INTRAVENOUS | Status: DC
  Filled 2011-12-12: qty 1000

## 2011-12-12 MED ORDER — SODIUM CHLORIDE 0.9 % IV SOLN
2.0000 g | Freq: Once | INTRAVENOUS | Status: AC
  Administered 2011-12-12: 2 g via INTRAVENOUS
  Filled 2011-12-12: qty 2000

## 2011-12-12 MED ORDER — LACTATED RINGERS IV SOLN: 500.0000 mL | INTRAVENOUS | Status: DC | PRN

## 2011-12-12 MED ORDER — SIMETHICONE 80 MG PO CHEW: 80.0000 mg | CHEWABLE_TABLET | ORAL | Status: DC | PRN

## 2011-12-12 MED ORDER — EPHEDRINE 5 MG/ML INJ
10.0000 mg | INTRAVENOUS | Status: DC | PRN
  Filled 2011-12-12: qty 4

## 2011-12-12 MED ORDER — SENNOSIDES-DOCUSATE SODIUM 8.6-50 MG PO TABS
2.0000 | ORAL_TABLET | Freq: Every day | ORAL | Status: DC
  Administered 2011-12-12 – 2011-12-13 (×2): 2 via ORAL

## 2011-12-12 NOTE — Anesthesia Preprocedure Evaluation (Signed)
Anesthesia Evaluation  Patient identified by MRN, date of birth, ID band Patient awake    Reviewed: Allergy & Precautions, H&P , Patient's Chart, lab work & pertinent test results  Airway Mallampati: II  TM Distance: >3 FB Neck ROM: full    Dental  (+) Teeth Intact   Pulmonary asthma ,  breath sounds clear to auscultation        Cardiovascular Rhythm:regular Rate:Normal     Neuro/Psych    GI/Hepatic   Endo/Other  Morbid obesity  Renal/GU      Musculoskeletal   Abdominal   Peds  Hematology   Anesthesia Other Findings       Reproductive/Obstetrics (+) Pregnancy                             Anesthesia Physical Anesthesia Plan  ASA: III  Anesthesia Plan: Epidural   Post-op Pain Management:    Induction:   Airway Management Planned:   Additional Equipment:   Intra-op Plan:   Post-operative Plan:   Informed Consent: I have reviewed the patients History and Physical, chart, labs and discussed the procedure including the risks, benefits and alternatives for the proposed anesthesia with the patient or authorized representative who has indicated his/her understanding and acceptance.   Dental Advisory Given  Plan Discussed with:   Anesthesia Plan Comments: (Labs checked- platelets confirmed with RN in room. Fetal heart tracing, per RN, reported to be stable enough for sitting procedure. Discussed epidural, and patient consents to the procedure:  included risk of possible headache,backache, failed block, allergic reaction, and nerve injury. This patient was asked if she had any questions or concerns before the procedure started.)        Anesthesia Quick Evaluation  

## 2011-12-12 NOTE — Anesthesia Procedure Notes (Signed)
Spinal  Patient location during procedure: OR Preanesthetic Checklist Completed: patient identified, site marked, surgical consent, pre-op evaluation, timeout performed, IV checked, risks and benefits discussed and monitors and equipment checked Spinal Block Patient position: sitting Prep: DuraPrep Patient monitoring: cardiac monitor, continuous pulse ox, blood pressure and heart rate Approach: midline Location: L3-4 Injection technique: catheter Needle Needle type: Tuohy and Sprotte  Needle gauge: 24 G Needle length: 12.7 cm Needle insertion depth: 6 cm Catheter type: closed end flexible Catheter size: 19 g Catheter at skin depth: 0 cm Additional Notes CLE with LOR at 6cm sprotte to clear CSF Dosed with 3 cc from epidural pump Catheter (+) asp heme, removed intact as pt is complete

## 2011-12-12 NOTE — Progress Notes (Signed)
See H&P by Dr. Ellyn Hack 26 yo, G3 P2002, scheduled for induction this am, presented in advanced active labor.  On initial evaluation, she was 6 cm with BBOW.  By exam by RN she progressed to 9 cm. Afeb, VSS FHT- Cat I, ctx q 2-4 minutes VE- 8/C/0, vtx, AROM done due to BBOW-I was unable to assess cervix prior to AROM Will try to get epidural, get Ampicillin running for GBS, anticipate SVD.

## 2011-12-13 MED ORDER — BENZOCAINE-MENTHOL 20-0.5 % EX AERO
INHALATION_SPRAY | CUTANEOUS | Status: AC
  Administered 2011-12-13: 1 via TOPICAL
  Filled 2011-12-13: qty 56

## 2011-12-13 NOTE — Addendum Note (Signed)
Addendum  created 12/13/11 2141 by Jiles Garter, MD   Modules edited:Notes Section

## 2011-12-13 NOTE — Progress Notes (Signed)
PPD #1 No problems, sore tailbone Afeb, VSS Fundus firm, NT at U-1 Continue routine postpartum care

## 2011-12-13 NOTE — Anesthesia Postprocedure Evaluation (Signed)
  Anesthesia Post-op Note  Patient: [name]  Procedure(s) Performed: * No procedures listed * This patient has recovered from her labor epidural, and I am not aware of any complications or problems.

## 2011-12-13 NOTE — Progress Notes (Signed)
UR chart review completed.  

## 2011-12-13 NOTE — Progress Notes (Addendum)
Clinical Social Work Department  BRIEF PSYCHOSOCIAL ASSESSMENT  12/13/2011  Patient: [name],[name] Account Number: [id number] Admit date: 12/12/2011  Clinical Social Worker: Kyrra Prada, LCSWA Date/Time: 12/13/2011 11:00 AM  Referred by: Physician Date Referred: 12/13/2011  Referred for   Behavioral Health Issues   Other Referral:  History of anxiety/depression   Interview type: Family  Other interview type:  Spouse was present   PSYCHOSOCIAL DATA  Living Status: HUSBAND  Admitted from facility:  Level of care:  Primary support name: [name]  Primary support relationship to patient: SPOUSE  Degree of support available:  Involved   CURRENT CONCERNS  Current Concerns   Behavioral Health Issues   Other Concerns:  SOCIAL WORK ASSESSMENT / PLAN  Sw met with pt and spouse to assess pt's history of depression/ anxiety. Pt appeared hesitant to speak with Sw about the history, as she could "not remember" when she was diagnosed or took medication. When Sw asked pt a question about history, she responded (with a giggle), "I don't know I can't remember and deferred to spouse. Spouse was at the bedside and was not able to assist pt with answers. After several open ended questions, Sw was able to learn that pt took medication (Lexapro) for approximately 6 months. She told pt she took medication to help with overwhelming feelings. She does not plan to restart medication but will adhere to MD recommendations if depression/anxiety symptoms arise. She denies any depression or SI during the pregnancy. She has all the necessary supplies for the infant. Pt was evasive and did not openly talk to Sw. Sw was not able to observe the parents interaction with the infant.   Assessment/plan status:  Other assessment/ plan:  Information/referral to community resources:  PATIENT'S/FAMILY'S RESPONSE TO PLAN OF CARE:  Pt and spouse thanked Sw for consult but Sw is not sure how receptive they were to information.      

## 2011-12-14 MED ORDER — PRENATAL MULTIVITAMIN CH: 1.0000 | ORAL_TABLET | Freq: Every day | ORAL | Status: DC

## 2011-12-14 MED ORDER — IBUPROFEN 600 MG PO TABS: 600.0000 mg | ORAL_TABLET | Freq: Four times a day (QID) | ORAL | Status: DC | PRN

## 2011-12-14 MED ORDER — OXYCODONE-ACETAMINOPHEN 5-325 MG PO TABS: 1.0000 | ORAL_TABLET | Freq: Three times a day (TID) | ORAL | Status: AC | PRN

## 2011-12-14 NOTE — Progress Notes (Signed)
Patient ID: [name], female   DOB: 28-May-1986, 26 y.o.   MRN: [id number] #2 afebrile BP normal for D/C

## 2011-12-14 NOTE — Discharge Summary (Signed)
[name], DOKKEN NO.:  [id number]  MEDICAL RECORD NO.:  [id number]  LOCATION:  9139                          FACILITY:  [facility]  PHYSICIAN:  Malachi Pro. Ambrose Mantle, M.D. DATE OF BIRTH:  [birth year]  DATE OF ADMISSION:  12/12/2011 DATE OF DISCHARGE:  12/14/2011                              DISCHARGE SUMMARY   HISTORY OF PRESENT ILLNESS:  This is a 26 year old white female, para 2- 0-0-2, gravida 3 at 39+ weeks gestation for induction of labor.  She had a positive group B Strep, elevated Glucola at 1 hour but normal 3 hour GTT.  She had a past history of kidney stones, migraines, postural orthostatic tachycardia, and herpes simplex virus type 1.  She had a history of 2 vaginal deliveries, 8 pounds, and 8 pounds 9 ounces.  No known allergies.  Denied alcohol, tobacco, and drug use.  On admission, she was afebrile with normal vital signs.  Heart and lungs were normal. Cervix was 4 cm dilated, vertex presentation.  Blood group and type O positive with a negative antibody.  Pap smear normal, rubella immune, RPR nonreactive.  Urine culture positive for group B strep, hepatitis B surface antigen negative, HIV negative, GC and Chlamydia negative. First trimester screen and AFP normal.  One hour Glucola 138, 3 hour GTT was normal.  Ultrasound gave an [facility] of March 21 by 7 week ultrasound. She was scheduled to start on Pitocin, but she was admitted to the hospital in advanced labor.  On initial exam, she was 6 cm, with a bulging bag of waters.  She progressed to 9 cm.  By Dr. Eligha Bridegroom exam, she was 8 cm.  Artificial rupture of membranes was done. Ampicillin was given for the group B strep.  The patient progressed to full dilatation.  She pushed very well.  Ampicillin was given prior to delivery.  At 6:59 a.m., a living female infant was delivered vaginally 7 pounds 11 ounces, Apgars of 9 and 10 at 1 and 5 minutes.  Placenta was intact.  Spontaneous 3 vessel cord was noted.   Anesthesia was spinal. No episiotomy was done.  Second-degree laceration repaired with 3-0 Vicryl.  Postpartum, the patient did well and was discharged on the second postpartum day.  LABORATORY DATA:  Hemoglobin was 12.9, hematocrit 38.6, white count 14,600, platelet count 226,000, RPR was nonreactive.  FINAL DIAGNOSIS:  Intrauterine pregnancy at 39 weeks, delivered vertex.  OPERATION:  Spontaneous delivery vertex, repair of second-degree laceration.  FINAL CONDITION:  Improved.  INSTRUCTIONS:  Our regular discharge instruction booklet.  The patient is given prescriptions for Motrin 600 mg, 30 tablets, 1 every 6 hours as needed for pain, prenatal vitamins with iron, 30 tablets with 10 refills 1 a day, and oxycodone with Tylenol 5/325, 1 tablet to 2 tablets every 8 hours as needed for pain, 15 with no refills.  The patient is given a discharge instruction booklet and advised to return to the office in 6 weeks for followup examination.     Malachi Pro. Ambrose Mantle, M.D.     TFH/MEDQ  D:  12/14/2011  T:  12/14/2011  Job:  [id number]

## 2011-12-14 NOTE — Discharge Instructions (Signed)
booklet °

## 2011-12-27 ENCOUNTER — Encounter (HOSPITAL_COMMUNITY): Payer: Self-pay

## 2011-12-27 ENCOUNTER — Emergency Department (INDEPENDENT_AMBULATORY_CARE_PROVIDER_SITE_OTHER)
Admission: EM | Admit: 2011-12-27 | Discharge: 2011-12-27 | Disposition: A | Discharge status: Home / Self Care | Payer: BC Managed Care – PPO | Source: Home / Self Care | Attending: Emergency Medicine | Admitting: Emergency Medicine

## 2011-12-27 DIAGNOSIS — H109 Unspecified conjunctivitis: Secondary | ICD-10-CM

## 2011-12-27 MED ORDER — POLYETHYL GLYCOL-PROPYL GLYCOL 0.4-0.3 % OP SOLN: 1.0000 [drp] | Freq: Four times a day (QID) | OPHTHALMIC | Status: DC | PRN

## 2011-12-27 MED ORDER — TOBRAMYCIN 0.3 % OP SOLN: 1.0000 [drp] | OPHTHALMIC | Status: AC

## 2011-12-27 NOTE — Discharge Instructions (Signed)
This case is the conjunctivitis are viral, and do not need a biotics. Start doing warm compresses, and washed your and your children's hands frequently. Use steam as much as you want for comfort. However, You may start the antibiotic eyedrops. He will need to throw away her makeup if you're wearing any. Followup with your physician if this does not fully resolve in a week. Return to ED if lights are bothering your eyes, if you have trouble seeing, if you have fever above 100.4, or for any other concerns.

## 2011-12-27 NOTE — ED Notes (Signed)
Older child has pink eye , and he is sharing w rest of household

## 2011-12-27 NOTE — ED Provider Notes (Signed)
History     CSN: [id number]  Arrival date & time 12/27/11  0932   First MD Initiated Contact with Patient 12/27/11 1005      Chief Complaint  Patient presents with  . Conjunctivitis    (Consider location/radiation/quality/duration/timing/severity/associated sxs/prior treatment) HPI Comments: Patient wears glasses. She does not wear contacts.  Patient is a 26 y.o. female presenting with conjunctivitis. The history is provided by the patient. No language interpreter was used.  Conjunctivitis  The current episode started yesterday. The onset was sudden. The problem occurs continuously. The problem has been unchanged. The problem is mild. The symptoms are relieved by nothing. The symptoms are aggravated by nothing. Associated symptoms include eye itching, eye discharge and eye redness. Pertinent negatives include no fever, no decreased vision, no double vision, no photophobia, no congestion, no ear pain, no rhinorrhea, no swollen glands, no URI and no eye pain. There is pain in both eyes. The eye pain is not associated with movement. The eyelid exhibits no abnormality. There were sick contacts at home (Older son with identical symptoms earlier this week, now resolved).    Past Medical History  Diagnosis Date  . Nephrolithiasis   . Concussion   . Asthma   . Anxiety   . Depression   . Herpes   . Keratosis   . Headache   . Postural orthostatic tachycardia syndrome     Past Surgical History  Procedure Date  . No past surgeries     Family History  Problem Relation Age of Onset  . Heart disease Neg Hx     early heart disease  . Sudden death Neg Hx     cardiac  . Cardiomyopathy Neg Hx   . Alcohol abuse Father   . Diabetes Father   . Arthritis Maternal Aunt   . Arthritis Maternal Grandmother   . COPD Maternal Grandmother   . Thyroid disease Maternal Grandmother   . Alcohol abuse Maternal Grandfather   . Cancer Maternal Grandfather     liver, pancreatic  . Cancer Paternal  Grandmother     brain  . Alcohol abuse Paternal Grandfather   . Stroke Paternal Grandfather     History  Substance Use Topics  . Smoking status: Former Smoker    Types: Cigarettes    Quit date: 12/01/2008  . Smokeless tobacco: Not on file  . Alcohol Use: No     occas    OB History    Grav Para Term Preterm Abortions TAB SAB Ect Mult Living   3 3 3  0 0 0 0 0 0 3      Review of Systems  Constitutional: Negative for fever.  HENT: Negative for ear pain, congestion and rhinorrhea.   Eyes: Positive for discharge, redness and itching. Negative for double vision, photophobia and pain.    Allergies  Review of patient's allergies indicates no known allergies.  Home Medications   Current Outpatient Rx  Name Route Sig Dispense Refill  . OMEPRAZOLE 20 MG PO CPDR Oral Take 20 mg by mouth daily.     Marland Kitchen POLYETHYL GLYCOL-PROPYL GLYCOL 0.4-0.3 % OP SOLN Ophthalmic Apply 1 drop to eye 4 (four) times daily as needed. 5 mL 0  . PRENATAL MULTIVITAMIN CH Oral Take 1 tablet by mouth daily. 30 tablet 10  . TOBRAMYCIN SULFATE 0.3 % OP SOLN Both Eyes Place 1 drop into both eyes every 4 (four) hours. X 5 days 5 mL 0    BP 111/76  Pulse 64  Temp(Src)  98.8 F (37.1 C) (Oral)  Resp 12  SpO2 100%  Physical Exam  Nursing note and vitals reviewed. Constitutional: She is oriented to person, place, and time. She appears well-developed and well-nourished. No distress.  HENT:  Head: Normocephalic and atraumatic.  Eyes: EOM and lids are normal. Pupils are equal, round, and reactive to light. No foreign bodies found.       Mild bilateral conjunctival injection. Scant greenish exudate. corrected Visual acuity left eye 20/25, right eye 20/20  Neck: Normal range of motion.  Cardiovascular: Normal rate.   Pulmonary/Chest: Effort normal.  Abdominal: She exhibits no distension.  Musculoskeletal: Normal range of motion.  Lymphadenopathy:    She has no cervical adenopathy.  Neurological: She is alert  and oriented to person, place, and time.  Skin: Skin is warm and dry.  Psychiatric: She has a normal mood and affect. Her behavior is normal. Judgment and thought content normal.    ED Course  Procedures (including critical care time)  Labs Reviewed - No data to display No results found.   1. Conjunctivitis       MDM    Luiz Blare, MD 12/27/11 1200

## 2012-05-19 ENCOUNTER — Ambulatory Visit: Payer: BC Managed Care – PPO | Admitting: Cardiology

## 2012-12-30 ENCOUNTER — Telehealth: Payer: Self-pay | Admitting: Nurse Practitioner

## 2012-12-30 ENCOUNTER — Other Ambulatory Visit: Payer: Self-pay | Admitting: Nurse Practitioner

## 2012-12-30 MED ORDER — ALBUTEROL SULFATE HFA 108 (90 BASE) MCG/ACT IN AERS: 2.0000 | INHALATION_SPRAY | Freq: Four times a day (QID) | RESPIRATORY_TRACT | Status: DC | PRN

## 2012-12-30 NOTE — Telephone Encounter (Signed)
Pt aware inhaler called in

## 2012-12-30 NOTE — Telephone Encounter (Signed)
Please advise 

## 2012-12-30 NOTE — Telephone Encounter (Signed)
Request sent to pharmacy

## 2013-01-05 ENCOUNTER — Telehealth: Payer: Self-pay | Admitting: Nurse Practitioner

## 2013-01-06 ENCOUNTER — Telehealth: Payer: Self-pay | Admitting: Nurse Practitioner

## 2013-01-06 ENCOUNTER — Encounter: Payer: Self-pay | Admitting: Family Medicine

## 2013-01-06 ENCOUNTER — Ambulatory Visit (INDEPENDENT_AMBULATORY_CARE_PROVIDER_SITE_OTHER): Payer: BC Managed Care – PPO | Admitting: Family Medicine

## 2013-01-06 ENCOUNTER — Other Ambulatory Visit: Payer: Self-pay | Admitting: Family Medicine

## 2013-01-06 ENCOUNTER — Ambulatory Visit (INDEPENDENT_AMBULATORY_CARE_PROVIDER_SITE_OTHER): Payer: BC Managed Care – PPO

## 2013-01-06 VITALS — BP 117/85 | HR 80 | Temp 97.8°F | Wt 172.2 lb

## 2013-01-06 DIAGNOSIS — S96919A Strain of unspecified muscle and tendon at ankle and foot level, unspecified foot, initial encounter: Secondary | ICD-10-CM

## 2013-01-06 DIAGNOSIS — M25579 Pain in unspecified ankle and joints of unspecified foot: Secondary | ICD-10-CM

## 2013-01-06 DIAGNOSIS — M25571 Pain in right ankle and joints of right foot: Secondary | ICD-10-CM

## 2013-01-06 DIAGNOSIS — S93409A Sprain of unspecified ligament of unspecified ankle, initial encounter: Secondary | ICD-10-CM

## 2013-01-06 MED ORDER — MELOXICAM 15 MG PO TABS: 15.0000 mg | ORAL_TABLET | Freq: Every day | ORAL | Status: DC

## 2013-01-06 NOTE — Progress Notes (Signed)
  Subjective:    Patient ID: [name], female    DOB: 12/04/1985, 27 y.o.   MRN: [id number]  HPI Patient works at home. She started an exercise program 9 days ago. She started having bilateral foot and ankle pain about 4 or 5 days after starting the exercise program. She's been taking ibuprofen over-the-counter 600 mg 2 times daily in addition to extra strength Tylenol .  Review of Systems  Musculoskeletal:       Bilateral foot and ankle pain, right worse than left       Objective:   Physical Exam  Nursing note and vitals reviewed. Constitutional: She is oriented to person, place, and time. She appears well-nourished.  HENT:  Head: Normocephalic and atraumatic.  Eyes: Conjunctivae are normal.  Neck: Normal range of motion.  Musculoskeletal: She exhibits tenderness. She exhibits no edema.  Patient is tender dorsal right foot and posterior to right medial malleolar area. She is also is tender posterior to the left lateral malleolus. There is pain with movement of both ankles.  Neurological: She is alert and oriented to person, place, and time.  Skin: Skin is warm and dry.  Psychiatric: Her behavior is normal. Judgment normal.   [facility] reading (PRIMARY) by  Dr. Vernon Prey: X-rays of right proximal foot and ankle appear normal                                         Assessment & Plan:   Bilateral foot and ankle  Currently breast-feeding  Plan: mobic as directed           Moist heat

## 2013-01-06 NOTE — Patient Instructions (Addendum)
Use moist heat for 20 minutes 3-4 times daily Increase exercise as tolerated Make sure that you take the omeprazole to protect stomach from Mobic irritating her stomach As. ankle gets better reduce the dose of Mobic gradually and discontinue, ie. 1/2 daily for a few days , then discontinue

## 2013-01-06 NOTE — Telephone Encounter (Signed)
APPT MADE

## 2013-02-03 NOTE — Telephone Encounter (Signed)
error 

## 2013-02-08 ENCOUNTER — Telehealth: Payer: Self-pay | Admitting: Nurse Practitioner

## 2013-02-08 NOTE — Telephone Encounter (Signed)
Pt aware.

## 2013-02-08 NOTE — Telephone Encounter (Signed)
No usually just tape it to the toe next to it

## 2013-02-23 ENCOUNTER — Encounter: Payer: Self-pay | Admitting: Physician Assistant

## 2013-02-23 ENCOUNTER — Ambulatory Visit (INDEPENDENT_AMBULATORY_CARE_PROVIDER_SITE_OTHER): Payer: BC Managed Care – PPO | Admitting: Physician Assistant

## 2013-02-23 ENCOUNTER — Telehealth: Payer: Self-pay | Admitting: Nurse Practitioner

## 2013-02-23 ENCOUNTER — Ambulatory Visit (INDEPENDENT_AMBULATORY_CARE_PROVIDER_SITE_OTHER): Payer: BC Managed Care – PPO

## 2013-02-23 VITALS — BP 119/77 | HR 75 | Temp 98.2°F | Ht 62.0 in | Wt 172.0 lb

## 2013-02-23 DIAGNOSIS — M79674 Pain in right toe(s): Secondary | ICD-10-CM

## 2013-02-23 DIAGNOSIS — M79609 Pain in unspecified limb: Secondary | ICD-10-CM

## 2013-02-23 DIAGNOSIS — S92309A Fracture of unspecified metatarsal bone(s), unspecified foot, initial encounter for closed fracture: Secondary | ICD-10-CM

## 2013-02-23 DIAGNOSIS — S92301A Fracture of unspecified metatarsal bone(s), right foot, initial encounter for closed fracture: Secondary | ICD-10-CM

## 2013-02-23 NOTE — Progress Notes (Signed)
Subjective:     Patient ID: [name], female   DOB: 07-16-1986, 27 y.o.   MRN: [id number]  HPI Pt with R foot pain for 4 weeks She states she hit her toe 4 weeks ago She noted pain and bruising to the area but felt it would just get better The toe got hit again last pm and due to the pain pt here for review  Review of Systems  All other systems reviewed and are negative.       Objective:   Physical Exam  Nursing note and vitals reviewed.  No ecchy/edema today No deviation of the toe Good cap rf/sensory Good ROM- sx increased with full flexion + TTP distal  4th metatarsal head FROM foot/ankle Xray- + distal metatarsal fx    Assessment:     1. Toe pain, right   2. Metatarsal bone fracture, right, closed, initial encounter        Plan:     Post- op shoe OTC NSAIDS F/U in 3 wks

## 2013-02-23 NOTE — Patient Instructions (Signed)
Metatarsal Fracture, Undisplaced A metatarsal fracture is a break in the bone(s) of the foot. These are the bones of the foot that connect your toes to the bones of the ankle. DIAGNOSIS  The diagnoses of these fractures are usually made with X-rays. If there are problems in the forefoot and x-rays are normal a later bone scan will usually make the diagnosis.  TREATMENT AND HOME CARE INSTRUCTIONS  Treatment may or may not include a cast or walking shoe. When casts are needed the use is usually for short periods of time so as not to slow down healing with muscle wasting (atrophy).  Activities should be stopped until further advised by your caregiver.  Wear shoes with adequate shock absorbing capabilities and stiff soles.  Alternative exercise may be undertaken while waiting for healing. These may include bicycling and swimming, or as your caregiver suggests.  It is important to keep all follow-up visits or specialty referrals. The failure to keep these appointments could result in improper bone healing and chronic pain or disability.  Warning: Do not drive a car or operate a motor vehicle until your caregiver specifically tells you it is safe to do so. IF YOU DO NOT HAVE A CAST OR SPLINT:  You may walk on your injured foot as tolerated or advised.  Do not put any weight on your injured foot for as long as directed by your caregiver. Slowly increase the amount of time you walk on the foot as the pain allows or as advised.  Use crutches until you can bear weight without pain. A gradual increase in weight bearing may help.  Apply ice to the injury for 15-20 minutes each hour while awake for the first 2 days. Put the ice in a plastic bag and place a towel between the bag of ice and your skin.  Only take over-the-counter or prescription medicines for pain, discomfort, or fever as directed by your caregiver. SEEK IMMEDIATE MEDICAL CARE IF:   Your cast gets damaged or breaks.  You have  continued severe pain or more swelling than you did before the cast was put on, or the pain is not controlled with medications.  Your skin or nails below the injury turn blue or grey, or feel cold or numb.  There is a bad smell, or new stains or pus-like (purulent) drainage coming from the cast. MAKE SURE YOU:   Understand these instructions.  Will watch your condition.  Will get help right away if you are not doing well or get worse. Document Released: 06/08/2002 Document Revised: 12/09/2011 Document Reviewed: 04/29/2008 [employer] Patient Information 2014 [employer].  

## 2013-02-23 NOTE — Telephone Encounter (Signed)
APPT MADE

## 2013-02-25 ENCOUNTER — Encounter: Payer: Self-pay | Admitting: *Deleted

## 2013-03-09 ENCOUNTER — Ambulatory Visit: Payer: BC Managed Care – PPO | Admitting: Physician Assistant

## 2013-03-10 ENCOUNTER — Ambulatory Visit: Payer: BC Managed Care – PPO | Admitting: Physician Assistant

## 2013-10-22 ENCOUNTER — Telehealth: Payer: Self-pay | Admitting: Nurse Practitioner

## 2013-10-25 ENCOUNTER — Ambulatory Visit (INDEPENDENT_AMBULATORY_CARE_PROVIDER_SITE_OTHER): Payer: BC Managed Care – PPO | Admitting: General Practice

## 2013-10-25 ENCOUNTER — Encounter: Payer: Self-pay | Admitting: General Practice

## 2013-10-25 ENCOUNTER — Ambulatory Visit (INDEPENDENT_AMBULATORY_CARE_PROVIDER_SITE_OTHER): Payer: BC Managed Care – PPO

## 2013-10-25 VITALS — BP 121/71 | HR 72 | Temp 98.8°F | Ht 62.0 in | Wt 164.0 lb

## 2013-10-25 DIAGNOSIS — M79609 Pain in unspecified limb: Secondary | ICD-10-CM

## 2013-10-25 DIAGNOSIS — M79644 Pain in right finger(s): Secondary | ICD-10-CM

## 2013-10-25 DIAGNOSIS — R52 Pain, unspecified: Secondary | ICD-10-CM

## 2013-10-25 NOTE — Patient Instructions (Signed)
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
## 2013-10-25 NOTE — Telephone Encounter (Signed)
Pt already discussed weight loss med with Mae today and she had refused to rx.

## 2013-10-29 ENCOUNTER — Encounter: Payer: Self-pay | Admitting: Nurse Practitioner

## 2013-10-29 ENCOUNTER — Ambulatory Visit (INDEPENDENT_AMBULATORY_CARE_PROVIDER_SITE_OTHER): Payer: BC Managed Care – PPO | Admitting: Nurse Practitioner

## 2013-10-29 VITALS — BP 112/74 | HR 88 | Temp 98.3°F | Ht 62.0 in | Wt 162.0 lb

## 2013-10-29 DIAGNOSIS — J101 Influenza due to other identified influenza virus with other respiratory manifestations: Secondary | ICD-10-CM

## 2013-10-29 DIAGNOSIS — R509 Fever, unspecified: Secondary | ICD-10-CM

## 2013-10-29 LAB — POCT RAPID STREP A (OFFICE): RAPID STREP A SCREEN: NEGATIVE

## 2013-10-29 LAB — POCT INFLUENZA A/B
INFLUENZA A, POC: POSITIVE
INFLUENZA B, POC: NEGATIVE

## 2013-10-29 MED ORDER — OSELTAMIVIR PHOSPHATE 75 MG PO CAPS: 75.0000 mg | ORAL_CAPSULE | Freq: Two times a day (BID) | ORAL | Status: DC

## 2013-10-29 NOTE — Patient Instructions (Signed)

## 2013-10-29 NOTE — Progress Notes (Signed)
Subjective:    Patient ID: [name], female    DOB: 16-Jul-1986, 28 y.o.   MRN: [id number]  HPI  Patient in c/o fever of 101.8 body aches and congestion- this all started last night- unbale to sleep due to body aches.    Review of Systems  Constitutional: Positive for fever. Negative for chills.  HENT: Positive for congestion and rhinorrhea. Negative for sinus pressure, sore throat and trouble swallowing.   Respiratory: Positive for cough.   Cardiovascular: Negative.   Gastrointestinal: Negative.   Musculoskeletal: Positive for myalgias.  All other systems reviewed and are negative.       Objective:   Physical Exam  Constitutional: She is oriented to person, place, and time. She appears well-developed and well-nourished.  HENT:  Right Ear: Hearing, tympanic membrane, external ear and ear canal normal.  Left Ear: Hearing, tympanic membrane, external ear and ear canal normal.  Nose: Mucosal edema and rhinorrhea present. Right sinus exhibits no maxillary sinus tenderness and no frontal sinus tenderness. Left sinus exhibits no maxillary sinus tenderness and no frontal sinus tenderness.  Mouth/Throat: Uvula is midline, oropharynx is clear and moist and mucous membranes are normal.  Eyes: Conjunctivae are normal. Pupils are equal, round, and reactive to light.  Neck: Normal range of motion. Neck supple.  Cardiovascular: Normal rate, regular rhythm and normal heart sounds.   Pulmonary/Chest: Effort normal and breath sounds normal. No respiratory distress. She has no wheezes. She has no rales.  Dry cough  Abdominal: Soft. Bowel sounds are normal.  Lymphadenopathy:    She has no cervical adenopathy.  Neurological: She is alert and oriented to person, place, and time.  Skin: Skin is warm.  Psychiatric: She has a normal mood and affect. Her behavior is normal. Judgment and thought content normal.    BP 112/74  Pulse 88  Temp(Src) 98.3 F (36.8 C) (Oral)  Ht 5\' 2"  (1.575 m)   Wt 162 lb (73.483 kg)  BMI 29.62 kg/m2 Results for orders placed in visit on 10/29/13  POCT INFLUENZA A/B      Result Value Range   Influenza A, POC Positive     Influenza B, POC Negative    POCT RAPID STREP A (OFFICE)      Result Value Range   Rapid Strep A Screen Negative  Negative         Assessment & Plan:   1. Fever, unspecified   2. Influenza A (H1N1)    Meds ordered this encounter  Medications  . oseltamivir (TAMIFLU) 75 MG capsule    Sig: Take 1 capsule (75 mg total) by mouth 2 (two) times daily.    Dispense:  10 capsule    Refill:  0    Order Specific Question:  Supervising Provider    Answer:  Chipper Herb [[id number]]   1. Take meds as prescribed 2. Use a cool mist humidifier especially during the winter months and when heat has been humid. 3. Use saline nose sprays frequently 4. Saline irrigations of the nose can be very helpful if done frequently.  * 4X daily for 1 week*  * Use of a nettie pot can be helpful with this. Follow directions with this* 5. Drink plenty of fluids 6. Keep thermostat turn down low 7.For any cough or congestion  Use plain Mucinex- regular strength or max strength is fine   * Children- consult with Pharmacist for dosing 8. For fever or aces or pains- take tylenol or ibuprofen appropriate for  age and weight.  * for fevers greater than 101 orally you may alternate ibuprofen and tylenol every  3 hours.   Mary-Margaret Hassell Done, FNP

## 2014-01-18 ENCOUNTER — Observation Stay (HOSPITAL_COMMUNITY)
Admission: EM | Admit: 2014-01-18 | Discharge: 2014-01-19 | Disposition: A | Discharge status: Home / Self Care | Payer: BC Managed Care – PPO | Attending: Obstetrics and Gynecology | Admitting: Obstetrics and Gynecology

## 2014-01-18 ENCOUNTER — Encounter: Payer: Self-pay | Admitting: Family Medicine

## 2014-01-18 ENCOUNTER — Emergency Department (HOSPITAL_COMMUNITY): Payer: BC Managed Care – PPO

## 2014-01-18 ENCOUNTER — Encounter (HOSPITAL_COMMUNITY): Payer: Self-pay | Admitting: Emergency Medicine

## 2014-01-18 ENCOUNTER — Ambulatory Visit (INDEPENDENT_AMBULATORY_CARE_PROVIDER_SITE_OTHER): Payer: BC Managed Care – PPO | Admitting: Family Medicine

## 2014-01-18 ENCOUNTER — Telehealth: Payer: Self-pay | Admitting: Nurse Practitioner

## 2014-01-18 VITALS — BP 119/77 | HR 95 | Temp 99.2°F | Ht 62.0 in | Wt 162.8 lb

## 2014-01-18 DIAGNOSIS — R55 Syncope and collapse: Secondary | ICD-10-CM | POA: Insufficient documentation

## 2014-01-18 DIAGNOSIS — R112 Nausea with vomiting, unspecified: Secondary | ICD-10-CM

## 2014-01-18 DIAGNOSIS — N949 Unspecified condition associated with female genital organs and menstrual cycle: Secondary | ICD-10-CM | POA: Insufficient documentation

## 2014-01-18 DIAGNOSIS — G90A Postural orthostatic tachycardia syndrome (POTS): Secondary | ICD-10-CM

## 2014-01-18 DIAGNOSIS — N83209 Unspecified ovarian cyst, unspecified side: Principal | ICD-10-CM | POA: Diagnosis present

## 2014-01-18 DIAGNOSIS — G43909 Migraine, unspecified, not intractable, without status migrainosus: Secondary | ICD-10-CM | POA: Insufficient documentation

## 2014-01-18 DIAGNOSIS — I951 Orthostatic hypotension: Secondary | ICD-10-CM

## 2014-01-18 DIAGNOSIS — L570 Actinic keratosis: Secondary | ICD-10-CM | POA: Insufficient documentation

## 2014-01-18 DIAGNOSIS — N854 Malposition of uterus: Secondary | ICD-10-CM | POA: Insufficient documentation

## 2014-01-18 DIAGNOSIS — R197 Diarrhea, unspecified: Secondary | ICD-10-CM | POA: Insufficient documentation

## 2014-01-18 DIAGNOSIS — Q619 Cystic kidney disease, unspecified: Secondary | ICD-10-CM | POA: Insufficient documentation

## 2014-01-18 DIAGNOSIS — R000 Tachycardia, unspecified: Secondary | ICD-10-CM | POA: Insufficient documentation

## 2014-01-18 DIAGNOSIS — D72829 Elevated white blood cell count, unspecified: Secondary | ICD-10-CM

## 2014-01-18 DIAGNOSIS — F329 Major depressive disorder, single episode, unspecified: Secondary | ICD-10-CM | POA: Insufficient documentation

## 2014-01-18 DIAGNOSIS — F3289 Other specified depressive episodes: Secondary | ICD-10-CM | POA: Insufficient documentation

## 2014-01-18 DIAGNOSIS — G43009 Migraine without aura, not intractable, without status migrainosus: Secondary | ICD-10-CM

## 2014-01-18 LAB — COMPREHENSIVE METABOLIC PANEL
ALBUMIN: 4.1 g/dL (ref 3.5–5.2)
ALT: 16 U/L (ref 0–35)
AST: 14 U/L (ref 0–37)
Alkaline Phosphatase: 86 U/L (ref 39–117)
BILIRUBIN TOTAL: 1.1 mg/dL (ref 0.3–1.2)
BUN: 19 mg/dL (ref 6–23)
CO2: 20 mEq/L (ref 19–32)
CREATININE: 0.87 mg/dL (ref 0.50–1.10)
Calcium: 9.3 mg/dL (ref 8.4–10.5)
Chloride: 100 mEq/L (ref 96–112)
GFR calc non Af Amer: >90 mL/min (ref >90)
GLUCOSE: 128 mg/dL — AB (ref 70–99)
Potassium: 3.6 mEq/L — ABNORMAL LOW (ref 3.7–5.3)
Sodium: 135 mEq/L — ABNORMAL LOW (ref 137–147)
TOTAL PROTEIN: 7.5 g/dL (ref 6.0–8.3)

## 2014-01-18 LAB — POCT CBC
Granulocyte percent: 93.6 %G — AB (ref 37–80)
HCT, POC: 50.3 % — AB (ref 37.7–47.9)
Hemoglobin: 15.8 g/dL (ref 12.2–16.2)
Lymph, poc: 0.8 (ref 0.6–3.4)
MCH, POC: 27.6 pg (ref 27–31.2)
MCHC: 31.5 g/dL — AB (ref 31.8–35.4)
MCV: 87.5 fL (ref 80–97)
MPV: 7.6 fL (ref 0–99.8)
POC Granulocyte: 16.3 — AB (ref 2–6.9)
POC LYMPH PERCENT: 4.7 %L — AB (ref 10–50)
Platelet Count, POC: 285 10*3/uL (ref 142–424)
RBC: 5.8 M/uL — AB (ref 4.04–5.48)
RDW, POC: 13.1 %
WBC: 17.4 10*3/uL — AB (ref 4.6–10.2)

## 2014-01-18 LAB — CBC WITH DIFFERENTIAL/PLATELET
BASOS ABS: 0 10*3/uL (ref 0.0–0.1)
BASOS PCT: 0 % (ref 0–1)
EOS ABS: 0 10*3/uL (ref 0.0–0.7)
Eosinophils Relative: 0 % (ref 0–5)
HCT: 46.7 % — ABNORMAL HIGH (ref 36.0–46.0)
HEMOGLOBIN: 16.5 g/dL — AB (ref 12.0–15.0)
Lymphocytes Relative: 6 % — ABNORMAL LOW (ref 12–46)
Lymphs Abs: 1.4 10*3/uL (ref 0.7–4.0)
MCH: 30.2 pg (ref 26.0–34.0)
MCHC: 35.3 g/dL (ref 30.0–36.0)
MCV: 85.5 fL (ref 78.0–100.0)
MONOS PCT: 6 % (ref 3–12)
Monocytes Absolute: 1.2 10*3/uL — ABNORMAL HIGH (ref 0.1–1.0)
NEUTROS ABS: 18.5 10*3/uL — AB (ref 1.7–7.7)
NEUTROS PCT: 88 % — AB (ref 43–77)
PLATELETS: 299 10*3/uL (ref 150–400)
RBC: 5.46 MIL/uL — ABNORMAL HIGH (ref 3.87–5.11)
RDW: 12.8 % (ref 11.5–15.5)
WBC: 21.1 10*3/uL — ABNORMAL HIGH (ref 4.0–10.5)

## 2014-01-18 LAB — URINE MICROSCOPIC-ADD ON

## 2014-01-18 LAB — URINALYSIS, ROUTINE W REFLEX MICROSCOPIC
Bilirubin Urine: NEGATIVE
Glucose, UA: NEGATIVE mg/dL
Hgb urine dipstick: NEGATIVE
KETONES UR: 40 mg/dL — AB
NITRITE: NEGATIVE
PH: 7.5 (ref 5.0–8.0)
Protein, ur: 100 mg/dL — AB
SPECIFIC GRAVITY, URINE: 1.03 (ref 1.005–1.030)
UROBILINOGEN UA: 1 mg/dL (ref 0.0–1.0)

## 2014-01-18 LAB — WET PREP, GENITAL
Trich, Wet Prep: NONE SEEN
WBC, Wet Prep HPF POC: NONE SEEN
YEAST WET PREP: NONE SEEN

## 2014-01-18 LAB — POC URINE PREG, ED: PREG TEST UR: NEGATIVE

## 2014-01-18 LAB — LIPASE, BLOOD: LIPASE: 16 U/L (ref 11–59)

## 2014-01-18 LAB — CBG MONITORING, ED: GLUCOSE-CAPILLARY: 117 mg/dL — AB (ref 70–99)

## 2014-01-18 MED ORDER — ONDANSETRON HCL 4 MG/2ML IJ SOLN
4.0000 mg | Freq: Once | INTRAMUSCULAR | Status: AC
  Administered 2014-01-18: 4 mg via INTRAVENOUS
  Filled 2014-01-18: qty 2

## 2014-01-18 MED ORDER — SODIUM CHLORIDE 0.9 % IV SOLN
1000.0000 mL | INTRAVENOUS | Status: DC
  Administered 2014-01-18: 1000 mL via INTRAVENOUS

## 2014-01-18 MED ORDER — SODIUM CHLORIDE 0.9 % IV SOLN
1000.0000 mL | Freq: Once | INTRAVENOUS | Status: AC
  Administered 2014-01-18: 1000 mL via INTRAVENOUS

## 2014-01-18 MED ORDER — DOXYCYCLINE HYCLATE 100 MG PO TABS
100.0000 mg | ORAL_TABLET | Freq: Once | ORAL | Status: AC
  Administered 2014-01-18: 100 mg via ORAL
  Filled 2014-01-18: qty 1

## 2014-01-18 MED ORDER — DEXTROSE 5 % IV SOLN
1.0000 g | INTRAVENOUS | Status: DC
  Administered 2014-01-18: 1 g via INTRAVENOUS
  Filled 2014-01-18: qty 10

## 2014-01-18 MED ORDER — IOHEXOL 300 MG/ML  SOLN
100.0000 mL | Freq: Once | INTRAMUSCULAR | Status: AC | PRN
  Administered 2014-01-18: 100 mL via INTRAVENOUS

## 2014-01-18 MED ORDER — HYDROMORPHONE HCL PF 1 MG/ML IJ SOLN
0.5000 mg | INTRAMUSCULAR | Status: DC | PRN
  Administered 2014-01-18 (×2): 0.5 mg via INTRAVENOUS
  Filled 2014-01-18 (×2): qty 1

## 2014-01-18 MED ORDER — PROMETHAZINE HCL 25 MG/ML IJ SOLN
12.5000 mg | INTRAMUSCULAR | Status: DC | PRN
  Filled 2014-01-18: qty 1

## 2014-01-18 NOTE — ED Notes (Signed)
Patient transported to Ultrasound 

## 2014-01-18 NOTE — Progress Notes (Signed)
   Subjective:    Patient ID: [name], female    DOB: 02/18/1986, 28 y.o.   MRN: [id number]  HPI  This 28 y.o. female presents for evaluation of abdominal pain and nausea and diarrhea. She has had similar episode a month ago and her OBGYN referred to GI.  She woke up this am and felt fine and went to drop off the kids and get gas and developed some lower abdominal pain and NVD. She states she has had remote rectal bleeding and has filled up the toilet bowl with blood after a bm. She has been having some low grade fever 99-100.2.  She took a friends phenergan and it did not help.  Review of Systems C/o NVD, abdominal pain, and rectal bleeding No chest pain, SOB, HA, dizziness, vision change, constipation, dysuria, urinary urgency or frequency, myalgias, arthralgias or rash.     Objective:   Physical Exam Vital signs noted  Well developed well nourished female.  HEENT - Head atraumatic Normocephalic Respiratory - Lungs CTA bilateral Cardiac - RRR S1 and S2 without murmur GI - Abdomen soft tender right and left lower quadrant and periumbilical region no guarding and BS + x 4.  When walking to lab she c/o periumbilical abdominal pain  Results for orders placed in visit on 01/18/14  POCT CBC      Result Value Ref Range   WBC 17.4 (*) 4.6 - 10.2 K/uL   Lymph, poc 0.8  0.6 - 3.4   POC LYMPH PERCENT 4.7 (*) 10 - 50 %L   POC Granulocyte 16.3 (*) 2 - 6.9   Granulocyte percent 93.6 (*) 37 - 80 %G   RBC 5.8 (*) 4.04 - 5.48 M/uL   Hemoglobin 15.8  12.2 - 16.2 g/dL   HCT, POC 50.3 (*) 37.7 - 47.9 %   MCV 87.5  80 - 97 fL   MCH, POC 27.6  27 - 31.2 pg   MCHC 31.5 (*) 31.8 - 35.4 g/dL   RDW, POC 13.1     Platelet Count, POC 285.0  142 - 424 K/uL   MPV 7.6  0 - 99.8 fL         Assessment & Plan:  Nausea with vomiting - Plan: POCT CBC  Abdominal Pain - Refer to [facility] ER for r/o appendicitis Called and H&P given.  Patient leaves in POV and will be driven by  family.  Lysbeth Penner FNP

## 2014-01-18 NOTE — ED Provider Notes (Addendum)
CSN: [id number]     Arrival date & time 01/18/14  1823 History   First MD Initiated Contact with Patient 01/18/14 1853     Chief Complaint  Patient presents with  . Abdominal Pain    HPI Last month pt had a similar episode of abdominal pain.  She saw a GYN doctor and had a pelvic ultrasound.  The GYN did not feel that her symptoms were related to a gynecological cause and she was referred to PCP/GI.  Pt went to her PCP office today after having another episode of lower abdominal pain with vomiting and diarrhea starting yesterday.  The pain is in the lower right abdomen but also all over. Movement increases the pain.  Her PCP suggested evaluation in the ED to assess for appendicitis.  No vaginal bleeding or discharge.  While in triage in the ED pt became lightheaded and nearly fainted.  BP was low.  She emergently brought to an ED room. Past Medical History  Diagnosis Date  . Nephrolithiasis   . Concussion   . Asthma   . Anxiety   . Depression   . Herpes   . Keratosis   . Headache(784.0)   . Postural orthostatic tachycardia syndrome    Past Surgical History  Procedure Laterality Date  . No past surgeries     Family History  Problem Relation Age of Onset  . Heart disease Neg Hx     early heart disease  . Sudden death Neg Hx     cardiac  . Cardiomyopathy Neg Hx   . Alcohol abuse Father   . Diabetes Father   . Arthritis Maternal Aunt   . Arthritis Maternal Grandmother   . COPD Maternal Grandmother   . Thyroid disease Maternal Grandmother   . Alcohol abuse Maternal Grandfather   . Cancer Maternal Grandfather     liver, pancreatic  . Cancer Paternal Grandmother     brain  . Alcohol abuse Paternal Grandfather   . Stroke Paternal Grandfather    History  Substance Use Topics  . Smoking status: Never Smoker   . Smokeless tobacco: Never Used  . Alcohol Use: No     Comment: occas   OB History   Grav Para Term Preterm Abortions TAB SAB Ect Mult Living   3 3 3  0 0 0 0 0 0  3     Review of Systems  All other systems reviewed and are negative.     Allergies  Review of patient's allergies indicates no known allergies.  Home Medications   Prior to Admission medications   Medication Sig Start Date End Date Taking? Authorizing Provider  albuterol (PROVENTIL HFA;VENTOLIN HFA) 108 (90 BASE) MCG/ACT inhaler Inhale 2 puffs into the lungs every 6 (six) hours as needed for wheezing. 12/30/12   Mary-Margaret Hassell Done, FNP   BP 95/60  Pulse 94  Temp(Src) 98.4 F (36.9 C)  Resp 18  SpO2 99% Physical Exam  Nursing note and vitals reviewed. Constitutional: She appears distressed.  HENT:  Head: Normocephalic and atraumatic.  Right Ear: External ear normal.  Left Ear: External ear normal.  Eyes: Conjunctivae are normal. Right eye exhibits no discharge. Left eye exhibits no discharge. No scleral icterus.  Neck: Neck supple. No tracheal deviation present.  Cardiovascular: Normal rate, regular rhythm and intact distal pulses.   Pulmonary/Chest: Effort normal and breath sounds normal. No stridor. No respiratory distress. She has no wheezes. She has no rales.  Abdominal: Soft. Bowel sounds are normal. She exhibits  no distension. There is generalized tenderness. There is guarding. There is no rigidity and no rebound. No hernia. Hernia confirmed negative in the right inguinal area and confirmed negative in the left inguinal area.  Genitourinary: Uterus is tender. Cervix exhibits discharge (small amount white yellow). Cervix exhibits no motion tenderness. Right adnexum displays tenderness. Right adnexum displays no mass and no fullness. Left adnexum displays tenderness. Left adnexum displays no mass and no fullness. No signs of injury around the vagina.  Musculoskeletal: She exhibits no edema and no tenderness.  Neurological: She is alert. She has normal strength. No cranial nerve deficit (no facial droop, extraocular movements intact, no slurred speech) or sensory deficit. She  exhibits normal muscle tone. She displays no seizure activity. Coordination normal.  Skin: Skin is warm and dry. No rash noted. There is pallor.  Psychiatric: She has a normal mood and affect.    ED Course  Procedures (including critical care time) Labs Review Labs Reviewed  WET PREP, GENITAL - Abnormal; Notable for the following:    Clue Cells Wet Prep HPF POC FEW (*)    All other components within normal limits  COMPREHENSIVE METABOLIC PANEL - Abnormal; Notable for the following:    Sodium 135 (*)    Potassium 3.6 (*)    Glucose, Bld 128 (*)    All other components within normal limits  URINALYSIS, ROUTINE W REFLEX MICROSCOPIC - Abnormal; Notable for the following:    Color, Urine AMBER (*)    APPearance CLOUDY (*)    Ketones, ur 40 (*)    Protein, ur 100 (*)    Leukocytes, UA TRACE (*)    All other components within normal limits  CBC WITH DIFFERENTIAL - Abnormal; Notable for the following:    WBC 21.1 (*)    RBC 5.46 (*)    Hemoglobin 16.5 (*)    HCT 46.7 (*)    Neutrophils Relative % 88 (*)    Neutro Abs 18.5 (*)    Lymphocytes Relative 6 (*)    Monocytes Absolute 1.2 (*)    All other components within normal limits  URINE MICROSCOPIC-ADD ON - Abnormal; Notable for the following:    Squamous Epithelial / LPF MANY (*)    All other components within normal limits  CBG MONITORING, ED - Abnormal; Notable for the following:    Glucose-Capillary 117 (*)    All other components within normal limits  GC/CHLAMYDIA PROBE AMP  LIPASE, BLOOD  RPR  HIV ANTIBODY (ROUTINE TESTING)  POC URINE PREG, ED    Imaging Review US Transvaginal Non-ob  01/18/2014   CLINICAL DATA:  Right pelvic pain since this afternoon. Nausea, vomiting, diarrhea, syncope. Rule out torsion. Intrauterine device in place.  EXAM: TRANSABDOMINAL AND TRANSVAGINAL ULTRASOUND OF PELVIS  DOPPLER ULTRASOUND OF OVARIES  TECHNIQUE: Both transabdominal and transvaginal ultrasound examinations of the pelvis were  performed. Transabdominal technique was performed for global imaging of the pelvis including uterus, ovaries, adnexal regions, and pelvic cul-de-sac.  It was necessary to proceed with endovaginal exam following the transabdominal exam to visualize the ovaries. Color and duplex Doppler ultrasound was utilized to evaluate blood flow to the ovaries.  COMPARISON:  CT abdomen and pelvis 01/18/2014  FINDINGS: Uterus  Measurements: 8.2 x 4.7 x 6.8 cm, retroflexed. No fibroids or other mass visualized.  Endometrium  Intrauterine device. No evidence of significant thickening of the endometrium.  Right ovary  Measurements: 4.9 x 4.3 x 3.1 cm. Simple appearing cyst measuring about 3 cm diameter. No  internal flow or septation. Consistent with simple cyst, likely physiologic. Flow is demonstrated in the right ovary on color flow Doppler imaging.  Left ovary  Measurements: 4.3 x 2 x 1.8 cm. Normal follicular changes. No abnormal adnexal masses. Flow is demonstrated in the left ovary on color flow Doppler imaging.  Pulsed Doppler evaluation of both ovaries demonstrates normal low-resistance arterial and venous waveforms.  Other findings  No free fluid.  IMPRESSION: Normal ultrasound appearance of the uterus and ovaries. Presumed physiologic cyst in the right ovary. No evidence of ovarian torsion.   Electronically Signed   By: Lucienne Capers M.D.   On: 01/18/2014 22:36   US Pelvis Complete  01/18/2014   CLINICAL DATA:  Right pelvic pain since this afternoon. Nausea, vomiting, diarrhea, syncope. Rule out torsion. Intrauterine device in place.  EXAM: TRANSABDOMINAL AND TRANSVAGINAL ULTRASOUND OF PELVIS  DOPPLER ULTRASOUND OF OVARIES  TECHNIQUE: Both transabdominal and transvaginal ultrasound examinations of the pelvis were performed. Transabdominal technique was performed for global imaging of the pelvis including uterus, ovaries, adnexal regions, and pelvic cul-de-sac.  It was necessary to proceed with endovaginal exam  following the transabdominal exam to visualize the ovaries. Color and duplex Doppler ultrasound was utilized to evaluate blood flow to the ovaries.  COMPARISON:  CT abdomen and pelvis 01/18/2014  FINDINGS: Uterus  Measurements: 8.2 x 4.7 x 6.8 cm, retroflexed. No fibroids or other mass visualized.  Endometrium  Intrauterine device. No evidence of significant thickening of the endometrium.  Right ovary  Measurements: 4.9 x 4.3 x 3.1 cm. Simple appearing cyst measuring about 3 cm diameter. No internal flow or septation. Consistent with simple cyst, likely physiologic. Flow is demonstrated in the right ovary on color flow Doppler imaging.  Left ovary  Measurements: 4.3 x 2 x 1.8 cm. Normal follicular changes. No abnormal adnexal masses. Flow is demonstrated in the left ovary on color flow Doppler imaging.  Pulsed Doppler evaluation of both ovaries demonstrates normal low-resistance arterial and venous waveforms.  Other findings  No free fluid.  IMPRESSION: Normal ultrasound appearance of the uterus and ovaries. Presumed physiologic cyst in the right ovary. No evidence of ovarian torsion.   Electronically Signed   By: Lucienne Capers M.D.   On: 01/18/2014 22:36   Ct Abdomen Pelvis W Contrast  01/18/2014   CLINICAL DATA:  Right abdominal pain radiating to the back  EXAM: CT ABDOMEN AND PELVIS WITH CONTRAST  TECHNIQUE: Multidetector CT imaging of the abdomen and pelvis was performed using the standard protocol following bolus administration of intravenous contrast.  CONTRAST:  136mL OMNIPAQUE IOHEXOL 300 MG/ML  SOLN  COMPARISON:  US ABDOMEN COMPLETE dated 05/27/2011; CT UROGRAM dated 01/04/2005  FINDINGS: The liver, gallbladder, spleen, pancreas, adrenal glands are within normal limits  Tiny calculi in the upper pole collecting system of the right kidney. Tiny cysts in the right kidney.  Ovarian veins are dilated of unknown significance.  IUD is in place within the fundal endometrium. The uterus is retroflexed. 3.3 cm  simple cyst in the right adnexa is likely an ovarian cyst. Unremarkable bladder.  Normal appendix, best seen on sagittal reconstruction imaging.  No free-fluid.  No abnormal retroperitoneal adenopathy.  IMPRESSION: No acute intra-abdominal pathology.  3.3 cm cyst in the right adnexa is likely a benign ovarian cyst.   Electronically Signed   By: Maryclare Bean M.D.   On: 01/18/2014 20:51   [place] Art/ven Flow Abd Pelv Doppler  01/18/2014   CLINICAL DATA:  Right pelvic pain since  this afternoon. Nausea, vomiting, diarrhea, syncope. Rule out torsion. Intrauterine device in place.  EXAM: TRANSABDOMINAL AND TRANSVAGINAL ULTRASOUND OF PELVIS  DOPPLER ULTRASOUND OF OVARIES  TECHNIQUE: Both transabdominal and transvaginal ultrasound examinations of the pelvis were performed. Transabdominal technique was performed for global imaging of the pelvis including uterus, ovaries, adnexal regions, and pelvic cul-de-sac.  It was necessary to proceed with endovaginal exam following the transabdominal exam to visualize the ovaries. Color and duplex Doppler ultrasound was utilized to evaluate blood flow to the ovaries.  COMPARISON:  CT abdomen and pelvis 01/18/2014  FINDINGS: Uterus  Measurements: 8.2 x 4.7 x 6.8 cm, retroflexed. No fibroids or other mass visualized.  Endometrium  Intrauterine device. No evidence of significant thickening of the endometrium.  Right ovary  Measurements: 4.9 x 4.3 x 3.1 cm. Simple appearing cyst measuring about 3 cm diameter. No internal flow or septation. Consistent with simple cyst, likely physiologic. Flow is demonstrated in the right ovary on color flow Doppler imaging.  Left ovary  Measurements: 4.3 x 2 x 1.8 cm. Normal follicular changes. No abnormal adnexal masses. Flow is demonstrated in the left ovary on color flow Doppler imaging.  Pulsed Doppler evaluation of both ovaries demonstrates normal low-resistance arterial and venous waveforms.  Other findings  No free fluid.  IMPRESSION: Normal ultrasound  appearance of the uterus and ovaries. Presumed physiologic cyst in the right ovary. No evidence of ovarian torsion.   Electronically Signed   By: Lucienne Capers M.D.   On: 01/18/2014 22:36     EKG Interpretation   Date/Time:  Tuesday January 18 2014 18:43:51 EDT Ventricular Rate:  89 PR Interval:  132 QRS Duration: 76 QT Interval:  358 QTC Calculation: 435 R Axis:   90 Text Interpretation:  Normal sinus rhythm with sinus arrhythmia Rightward  axis Borderline ECG No old tracing to compare Confirmed by Dayson Aboud  MD-J,  Brigit Doke [phone]) on 01/18/2014 7:20:36 PM     Medications  0.9 %  sodium chloride infusion (0 mLs Intravenous Stopped 01/18/14 1955)    Followed by  0.9 %  sodium chloride infusion (1,000 mLs Intravenous New Bag/Given 01/18/14 2044)  HYDROmorphone (DILAUDID) injection 0.5 mg (0.5 mg Intravenous Given 01/18/14 2234)  cefTRIAXone (ROCEPHIN) 1 g in dextrose 5 % 50 mL IVPB (1 g Intravenous New Bag/Given 01/18/14 2239)  promethazine (PHENERGAN) injection 12.5 mg (not administered)  ondansetron (ZOFRAN) injection 4 mg (4 mg Intravenous Given 01/18/14 1952)  iohexol (OMNIPAQUE) 300 MG/ML solution 100 mL (100 mLs Intravenous Contrast Given 01/18/14 2024)  ondansetron (ZOFRAN) injection 4 mg (4 mg Intravenous Given 01/18/14 2044)  doxycycline (VIBRA-TABS) tablet 100 mg (100 mg Oral Given 01/18/14 2239)    2142  CT scan without obvious etiology for her abdominal pain.  Pelvic exam performed.  TTP in adnexa and uterus.  Will get pelvic US.  Still having nausea and vomiting.  Will give additional antiemetics MDM   Final diagnoses:  PID (acute pelvic inflammatory disease)    Pt's pelvic ultrasound is unremarkable.  CT scan did not show appendicitis, colitis or other GI etiology.    WBC significantly elevated.  Pt has had persistent nausea and vomiting associated with abdominal pain.  Suspect PID.  Will consult with OG GYN regarding admission for IV abx, further treatment.    Kathalene Frames,  MD 01/18/14 2300  D/w Dr Melba Coon.  Will send to [facility], discussed with Dr Melba Coon.   Kathalene Frames, MD 01/19/14 0001

## 2014-01-18 NOTE — Telephone Encounter (Signed)
appt at 3:30 with bill oxford

## 2014-01-18 NOTE — ED Notes (Signed)
Per pt sts here for lower abdominal pain and nausea. sts all day she has been feeling like she is going to pass out and tingling all over. sts pain on the right side and going into her back.

## 2014-01-19 ENCOUNTER — Encounter (HOSPITAL_COMMUNITY): Payer: Self-pay

## 2014-01-19 DIAGNOSIS — N83209 Unspecified ovarian cyst, unspecified side: Principal | ICD-10-CM

## 2014-01-19 LAB — CBC WITH DIFFERENTIAL/PLATELET
Basophils Absolute: 0 10*3/uL (ref 0.0–0.1)
Basophils Relative: 0 % (ref 0–1)
Eosinophils Absolute: 0 10*3/uL (ref 0.0–0.7)
Eosinophils Relative: 0 % (ref 0–5)
HEMATOCRIT: 40 % (ref 36.0–46.0)
Hemoglobin: 14.2 g/dL (ref 12.0–15.0)
LYMPHS PCT: 6 % — AB (ref 12–46)
Lymphs Abs: 0.9 10*3/uL (ref 0.7–4.0)
MCH: 30.4 pg (ref 26.0–34.0)
MCHC: 35.5 g/dL (ref 30.0–36.0)
MCV: 85.7 fL (ref 78.0–100.0)
Monocytes Absolute: 0.8 10*3/uL (ref 0.1–1.0)
Monocytes Relative: 5 % (ref 3–12)
NEUTROS ABS: 13.3 10*3/uL — AB (ref 1.7–7.7)
Neutrophils Relative %: 89 % — ABNORMAL HIGH (ref 43–77)
Platelets: 231 10*3/uL (ref 150–400)
RBC: 4.67 MIL/uL (ref 3.87–5.11)
RDW: 12.9 % (ref 11.5–15.5)
WBC: 15 10*3/uL — AB (ref 4.0–10.5)

## 2014-01-19 LAB — RPR

## 2014-01-19 LAB — HIV ANTIBODY (ROUTINE TESTING W REFLEX): HIV: NONREACTIVE

## 2014-01-19 LAB — GC/CHLAMYDIA PROBE AMP
CT Probe RNA: NEGATIVE
GC PROBE AMP APTIMA: NEGATIVE

## 2014-01-19 MED ORDER — IBUPROFEN 200 MG PO TABS
600.0000 mg | ORAL_TABLET | Freq: Once | ORAL | Status: AC
  Administered 2014-01-19: 600 mg via ORAL
  Filled 2014-01-19 (×2): qty 1

## 2014-01-19 MED ORDER — HYDROCODONE-ACETAMINOPHEN 5-325 MG PO TABS
1.0000 | ORAL_TABLET | Freq: Four times a day (QID) | ORAL | Status: DC | PRN
  Administered 2014-01-19: 2 via ORAL
  Filled 2014-01-19: qty 2

## 2014-01-19 MED ORDER — OXYCODONE-ACETAMINOPHEN 5-325 MG PO TABS: 1.0000 | ORAL_TABLET | ORAL | Status: DC | PRN

## 2014-01-19 MED ORDER — ONDANSETRON HCL 4 MG PO TABS: 4.0000 mg | ORAL_TABLET | Freq: Four times a day (QID) | ORAL | Status: DC | PRN

## 2014-01-19 MED ORDER — HYDROMORPHONE HCL PF 1 MG/ML IJ SOLN: 1.0000 mg | INTRAMUSCULAR | Status: DC | PRN

## 2014-01-19 MED ORDER — PROMETHAZINE HCL 12.5 MG PO TABS: ORAL_TABLET | ORAL | Status: DC

## 2014-01-19 MED ORDER — DOXYCYCLINE HYCLATE 100 MG PO TABS
100.0000 mg | ORAL_TABLET | Freq: Two times a day (BID) | ORAL | Status: DC
  Administered 2014-01-19: 100 mg via ORAL
  Filled 2014-01-19: qty 1

## 2014-01-19 MED ORDER — HYDROCODONE-ACETAMINOPHEN 5-325 MG PO TABS: 1.0000 | ORAL_TABLET | Freq: Four times a day (QID) | ORAL | Status: DC | PRN

## 2014-01-19 MED ORDER — IBUPROFEN 800 MG PO TABS: 800.0000 mg | ORAL_TABLET | Freq: Three times a day (TID) | ORAL | Status: DC | PRN

## 2014-01-19 MED ORDER — ONDANSETRON HCL 4 MG/2ML IJ SOLN: 4.0000 mg | Freq: Four times a day (QID) | INTRAMUSCULAR | Status: DC | PRN

## 2014-01-19 MED ORDER — BUTALBITAL-APAP-CAFFEINE 50-325-40 MG PO TABS
2.0000 | ORAL_TABLET | Freq: Once | ORAL | Status: AC
  Administered 2014-01-19: 2 via ORAL
  Filled 2014-01-19: qty 2

## 2014-01-19 MED ORDER — DEXTROSE IN LACTATED RINGERS 5 % IV SOLN
Freq: Once | INTRAVENOUS | Status: AC
  Administered 2014-01-19: 03:00:00 via INTRAVENOUS

## 2014-01-19 MED ORDER — DOXYCYCLINE HYCLATE 100 MG PO TABS: 100.0000 mg | ORAL_TABLET | Freq: Two times a day (BID) | ORAL | Status: DC

## 2014-01-19 MED ORDER — LACTATED RINGERS IV SOLN
INTRAVENOUS | Status: DC
  Administered 2014-01-19: 07:00:00 via INTRAVENOUS

## 2014-01-19 NOTE — [facility] Note (Addendum)
PT  HAS ARRIVED  [place].   TODAY SHE WENT   TO   [facility] FOR ABD PAIN-  DID LABS  SENT HER TO [facility] FOR CT SCAN .    AT 5-6PM - WAS AT [place].    LAST SEX-   SUN.   HAS IUD - FOR BIRTH CONTROL.      PT NOW SAYS   [facility]  BECAUSE SHE HAD BLOOD IN STOOL AND TOILET  X1.

## 2014-01-19 NOTE — Progress Notes (Signed)
Patient ID: [name], female   DOB: Sep 02, 1986, 28 y.o.   MRN: [id number]  Feeling better this AM, still with HA.  No vomiting overnight.  D/W pt ovarian cyst and peritoneal irritation causing pain.  Unlikely PID, but will give doxycycline course.  Recc BRAT diet, ibuprofen, hydration  AFVSS gen NAD CV RRR Lungs CTAB Abd soft, mild RLQ guarding, no rebound.  + BS Ext sym, NT  Will d/c if pain controlled, able to ambulate Will treat HA this AM

## 2014-01-19 NOTE — MAU Provider Note (Signed)
Chief Complaint: Abdominal Pain   First Provider Initiated Contact with Patient 01/19/14 0152     SUBJECTIVE HPI: [name] is a 28 y.o. [phone] non-pregnant female who was transported to MAU from [facility] ED for possible PID. Reports RLQ pain, N/V, low-grade fever since this morning. One previous episode of similer Sx 1 month ago accompanied by rectal bleeding. Seen at [facility] today for these Sx. WBCs 17. Sent to [facility] ED for appendicitis eval. Normal CT and pelvic US except for 3 cm simple right ovarian cyst. Reports having near-syncopal episode on the way to the ED followed by frontal HA which has persisted since then (no mention on ED note). Also reports several more near-syncopal episodes in the ED. EKG normal. Has Hx POT Syndrome and Migraines. Unsure if these Sx are the same as previous episodes or something new.   No Hx GC/Chlamydia. She states she is in a mutually monogamous relationship.  Past Medical History  Diagnosis Date  . Nephrolithiasis   . Concussion   . Asthma   . Anxiety   . Depression   . Herpes   . Keratosis   . Headache(784.0)   . Postural orthostatic tachycardia syndrome    OB History  Gravida Para Term Preterm AB SAB TAB Ectopic Multiple Living  3 3 3  0 0 0 0 0 0 3    # Outcome Date GA Lbr Len/2nd Weight Sex Delivery Anes PTL Lv  3 TRM 12/12/11 [redacted]w[redacted]d 05:20 / 00:09 3.487 kg (7 lb 11 oz) F SVD Spinal  Y     Comments: Normal  2 TRM 2007 [redacted]w[redacted]d 12:00 3.884 kg (8 lb 9 oz) M SVD EPI  Y  1 TRM 2006 [redacted]w[redacted]d 10:00 3.629 kg (8 lb) M VAC EPI  Y     Comments: kidney stones at 24 weeks     Past Surgical History  Procedure Laterality Date  . No past surgeries     History   Social History  . Marital Status: Married    Spouse Name: N/A    Number of Children: N/A  . Years of Education: N/A   Occupational History  . Not on file.   Social History Main Topics  . Smoking status: Never Smoker   . Smokeless tobacco: Never Used  . Alcohol Use: No      Comment: occas  . Drug Use: No  . Sexual Activity: Yes   Other Topics Concern  . Not on file   Social History Narrative   Married, 2 children.    No current facility-administered medications on file prior to encounter.   Current Outpatient Prescriptions on File Prior to Encounter  Medication Sig Dispense Refill  . albuterol (PROVENTIL HFA;VENTOLIN HFA) 108 (90 BASE) MCG/ACT inhaler Inhale 2 puffs into the lungs every 6 (six) hours as needed for wheezing.  1 Inhaler  0   No Known Allergies  ROS: Pertinent positive items in HPI. ? Chills. Neg for fever, body aches, neck pain, difficulties w/ speech or gait, urinary complaints, diarrhea, constipation, dyspareunia, rectal bleeding, vaginal bleeding or vaginal discharge.   OBJECTIVE Blood pressure 112/57, pulse 95, temperature 98.2 F (36.8 C), temperature source Oral, resp. rate 20, SpO2 100.00%. GENERAL: Well-developed, well-nourished female in mild distress. Drowsy. Non-toxic appearing.  HEENT: Normocephalic. Mucus membranes moist. Neck NT. Normal ROM. HEART: normal rate RESP: normal effort ABDOMEN: Soft, mild RLQ tenderness. Pos BS x 4. Neg mass or rebound tenderness. No CVAT.  EXTREMITIES: Nontender, no edema NEURO: Alert  and oriented SPECULUM EXAM: NEFG, Deferred. BIMANUAL: cervix closed; uterus normal size, mild right adnexal tenderness. No mass. No left adnexal tenderness or mass. NO CMT.   LAB RESULTS Results for orders placed during the hospital encounter of 01/18/14 (from the past 24 hour(s))  COMPREHENSIVE METABOLIC PANEL     Status: Abnormal   Collection Time    01/18/14  6:49 PM      Result Value Ref Range   Sodium 135 (*) 137 - 147 mEq/L   Potassium 3.6 (*) 3.7 - 5.3 mEq/L   Chloride 100  96 - 112 mEq/L   CO2 20  19 - 32 mEq/L   Glucose, Bld 128 (*) 70 - 99 mg/dL   BUN 19  6 - 23 mg/dL   Creatinine, Ser 0.87  0.50 - 1.10 mg/dL   Calcium 9.3  8.4 - 10.5 mg/dL   Total Protein 7.5  6.0 - 8.3 g/dL   Albumin  4.1  3.5 - 5.2 g/dL   AST 14  0 - 37 U/L   ALT 16  0 - 35 U/L   Alkaline Phosphatase 86  39 - 117 U/L   Total Bilirubin 1.1  0.3 - 1.2 mg/dL   GFR calc non Af Amer >90  >90 mL/min   GFR calc Af Amer >90  >90 mL/min  CBC WITH DIFFERENTIAL     Status: Abnormal   Collection Time    01/18/14  6:49 PM      Result Value Ref Range   WBC 21.1 (*) 4.0 - 10.5 K/uL   RBC 5.46 (*) 3.87 - 5.11 MIL/uL   Hemoglobin 16.5 (*) 12.0 - 15.0 g/dL   HCT 46.7 (*) 36.0 - 46.0 %   MCV 85.5  78.0 - 100.0 fL   MCH 30.2  26.0 - 34.0 pg   MCHC 35.3  30.0 - 36.0 g/dL   RDW 12.8  11.5 - 15.5 %   Platelets 299  150 - 400 K/uL   Neutrophils Relative % 88 (*) 43 - 77 %   Neutro Abs 18.5 (*) 1.7 - 7.7 K/uL   Lymphocytes Relative 6 (*) 12 - 46 %   Lymphs Abs 1.4  0.7 - 4.0 K/uL   Monocytes Relative 6  3 - 12 %   Monocytes Absolute 1.2 (*) 0.1 - 1.0 K/uL   Eosinophils Relative 0  0 - 5 %   Eosinophils Absolute 0.0  0.0 - 0.7 K/uL   Basophils Relative 0  0 - 1 %   Basophils Absolute 0.0  0.0 - 0.1 K/uL  CBG MONITORING, ED     Status: Abnormal   Collection Time    01/18/14  7:05 PM      Result Value Ref Range   Glucose-Capillary 117 (*) 70 - 99 mg/dL  URINALYSIS, ROUTINE W REFLEX MICROSCOPIC     Status: Abnormal   Collection Time    01/18/14  7:12 PM      Result Value Ref Range   Color, Urine AMBER (*) YELLOW   APPearance CLOUDY (*) CLEAR   Specific Gravity, Urine 1.030  1.005 - 1.030   pH 7.5  5.0 - 8.0   Glucose, UA NEGATIVE  NEGATIVE mg/dL   Hgb urine dipstick NEGATIVE  NEGATIVE   Bilirubin Urine NEGATIVE  NEGATIVE   Ketones, ur 40 (*) NEGATIVE mg/dL   Protein, ur 100 (*) NEGATIVE mg/dL   Urobilinogen, UA 1.0  0.0 - 1.0 mg/dL   Nitrite NEGATIVE  NEGATIVE   Leukocytes,  UA TRACE (*) NEGATIVE  URINE MICROSCOPIC-ADD ON     Status: Abnormal   Collection Time    01/18/14  7:12 PM      Result Value Ref Range   Squamous Epithelial / LPF MANY (*) RARE   WBC, UA 0-2  <3 WBC/hpf   RBC / HPF 0-2  <3 RBC/hpf    Bacteria, UA RARE  RARE  LIPASE, BLOOD     Status: None   Collection Time    01/18/14  7:15 PM      Result Value Ref Range   Lipase 16  11 - 59 U/L  POC URINE PREG, ED     Status: None   Collection Time    01/18/14  7:31 PM      Result Value Ref Range   Preg Test, Ur NEGATIVE  NEGATIVE  WET PREP, GENITAL     Status: Abnormal   Collection Time    01/18/14  9:38 PM      Result Value Ref Range   Yeast Wet Prep HPF POC NONE SEEN  NONE SEEN   Trich, Wet Prep NONE SEEN  NONE SEEN   Clue Cells Wet Prep HPF POC FEW (*) NONE SEEN   WBC, Wet Prep HPF POC NONE SEEN  NONE SEEN    IMAGING US Transvaginal Non-ob  01/18/2014   CLINICAL DATA:  Right pelvic pain since this afternoon. Nausea, vomiting, diarrhea, syncope. Rule out torsion. Intrauterine device in place.  EXAM: TRANSABDOMINAL AND TRANSVAGINAL ULTRASOUND OF PELVIS  DOPPLER ULTRASOUND OF OVARIES  TECHNIQUE: Both transabdominal and transvaginal ultrasound examinations of the pelvis were performed. Transabdominal technique was performed for global imaging of the pelvis including uterus, ovaries, adnexal regions, and pelvic cul-de-sac.  It was necessary to proceed with endovaginal exam following the transabdominal exam to visualize the ovaries. Color and duplex Doppler ultrasound was utilized to evaluate blood flow to the ovaries.  COMPARISON:  CT abdomen and pelvis 01/18/2014  FINDINGS: Uterus  Measurements: 8.2 x 4.7 x 6.8 cm, retroflexed. No fibroids or other mass visualized.  Endometrium  Intrauterine device. No evidence of significant thickening of the endometrium.  Right ovary  Measurements: 4.9 x 4.3 x 3.1 cm. Simple appearing cyst measuring about 3 cm diameter. No internal flow or septation. Consistent with simple cyst, likely physiologic. Flow is demonstrated in the right ovary on color flow Doppler imaging.  Left ovary  Measurements: 4.3 x 2 x 1.8 cm. Normal follicular changes. No abnormal adnexal masses. Flow is demonstrated in the left  ovary on color flow Doppler imaging.  Pulsed Doppler evaluation of both ovaries demonstrates normal low-resistance arterial and venous waveforms.  Other findings  No free fluid.  IMPRESSION: Normal ultrasound appearance of the uterus and ovaries. Presumed physiologic cyst in the right ovary. No evidence of ovarian torsion.   Electronically Signed   By: Lucienne Capers M.D.   On: 01/18/2014 22:36   US Pelvis Complete  01/18/2014   CLINICAL DATA:  Right pelvic pain since this afternoon. Nausea, vomiting, diarrhea, syncope. Rule out torsion. Intrauterine device in place.  EXAM: TRANSABDOMINAL AND TRANSVAGINAL ULTRASOUND OF PELVIS  DOPPLER ULTRASOUND OF OVARIES  TECHNIQUE: Both transabdominal and transvaginal ultrasound examinations of the pelvis were performed. Transabdominal technique was performed for global imaging of the pelvis including uterus, ovaries, adnexal regions, and pelvic cul-de-sac.  It was necessary to proceed with endovaginal exam following the transabdominal exam to visualize the ovaries. Color and duplex Doppler ultrasound was utilized to evaluate blood flow to  the ovaries.  COMPARISON:  CT abdomen and pelvis 01/18/2014  FINDINGS: Uterus  Measurements: 8.2 x 4.7 x 6.8 cm, retroflexed. No fibroids or other mass visualized.  Endometrium  Intrauterine device. No evidence of significant thickening of the endometrium.  Right ovary  Measurements: 4.9 x 4.3 x 3.1 cm. Simple appearing cyst measuring about 3 cm diameter. No internal flow or septation. Consistent with simple cyst, likely physiologic. Flow is demonstrated in the right ovary on color flow Doppler imaging.  Left ovary  Measurements: 4.3 x 2 x 1.8 cm. Normal follicular changes. No abnormal adnexal masses. Flow is demonstrated in the left ovary on color flow Doppler imaging.  Pulsed Doppler evaluation of both ovaries demonstrates normal low-resistance arterial and venous waveforms.  Other findings  No free fluid.  IMPRESSION: Normal ultrasound  appearance of the uterus and ovaries. Presumed physiologic cyst in the right ovary. No evidence of ovarian torsion.   Electronically Signed   By: Lucienne Capers M.D.   On: 01/18/2014 22:36   Ct Abdomen Pelvis W Contrast  01/18/2014   CLINICAL DATA:  Right abdominal pain radiating to the back  EXAM: CT ABDOMEN AND PELVIS WITH CONTRAST  TECHNIQUE: Multidetector CT imaging of the abdomen and pelvis was performed using the standard protocol following bolus administration of intravenous contrast.  CONTRAST:  149mL OMNIPAQUE IOHEXOL 300 MG/ML  SOLN  COMPARISON:  US ABDOMEN COMPLETE dated 05/27/2011; CT UROGRAM dated 01/04/2005  FINDINGS: The liver, gallbladder, spleen, pancreas, adrenal glands are within normal limits  Tiny calculi in the upper pole collecting system of the right kidney. Tiny cysts in the right kidney.  Ovarian veins are dilated of unknown significance.  IUD is in place within the fundal endometrium. The uterus is retroflexed. 3.3 cm simple cyst in the right adnexa is likely an ovarian cyst. Unremarkable bladder.  Normal appendix, best seen on sagittal reconstruction imaging.  No free-fluid.  No abnormal retroperitoneal adenopathy.  IMPRESSION: No acute intra-abdominal pathology.  3.3 cm cyst in the right adnexa is likely a benign ovarian cyst.   Electronically Signed   By: Maryclare Bean M.D.   On: 01/18/2014 20:51   [place] Art/ven Flow Abd Pelv Doppler  01/18/2014   CLINICAL DATA:  Right pelvic pain since this afternoon. Nausea, vomiting, diarrhea, syncope. Rule out torsion. Intrauterine device in place.  EXAM: TRANSABDOMINAL AND TRANSVAGINAL ULTRASOUND OF PELVIS  DOPPLER ULTRASOUND OF OVARIES  TECHNIQUE: Both transabdominal and transvaginal ultrasound examinations of the pelvis were performed. Transabdominal technique was performed for global imaging of the pelvis including uterus, ovaries, adnexal regions, and pelvic cul-de-sac.  It was necessary to proceed with endovaginal exam following the  transabdominal exam to visualize the ovaries. Color and duplex Doppler ultrasound was utilized to evaluate blood flow to the ovaries.  COMPARISON:  CT abdomen and pelvis 01/18/2014  FINDINGS: Uterus  Measurements: 8.2 x 4.7 x 6.8 cm, retroflexed. No fibroids or other mass visualized.  Endometrium  Intrauterine device. No evidence of significant thickening of the endometrium.  Right ovary  Measurements: 4.9 x 4.3 x 3.1 cm. Simple appearing cyst measuring about 3 cm diameter. No internal flow or septation. Consistent with simple cyst, likely physiologic. Flow is demonstrated in the right ovary on color flow Doppler imaging.  Left ovary  Measurements: 4.3 x 2 x 1.8 cm. Normal follicular changes. No abnormal adnexal masses. Flow is demonstrated in the left ovary on color flow Doppler imaging.  Pulsed Doppler evaluation of both ovaries demonstrates normal low-resistance arterial and venous waveforms.  Other findings  No free fluid.  IMPRESSION: Normal ultrasound appearance of the uterus and ovaries. Presumed physiologic cyst in the right ovary. No evidence of ovarian torsion.   Electronically Signed   By: Lucienne Capers M.D.   On: 01/18/2014 22:36    MAU COURSE Discussed exam w/ Dr. Melba Coon. No CMT. Last Dilaudid at 2230 and Ibuprofen at 0030. Doubt CMT masked by meds at this point. Doubt PID Dx. Uncertain whether all S/S are from cyst and POTS or if there is an unidentified infection.   ASSESSMENT 1. Simple ovarian cyst   2. Leukocytosis   3. POTS (postural orthostatic tachycardia syndrome)   4. Migraine without aura    PLAN Obs on [facility] per Dr. Melba Coon.  IV fluids. Pain meds and antiemetics. CBC w dif in am.  Urine culture, GC/CT pending.   [place], [place] 01/19/2014  2:59 AM

## 2014-01-19 NOTE — H&P (Signed)
[name] is an 28 y.o. female originally sent to ED with likely appendicitis, r/o with CT, negative, but simple ovarian cyst.  Also has IUD in place.  Has had vomiting.  C/O HA in ED.  Negative GC/Chl cultures in office 3/20, regular pelvic US - was evaluated at this time for pelvic pain.  Torsion ruled out in ED by [place].  WBC increasing, but hemoconcentrated.    Pertinent Gynecological History: No abn pap No STDs IUD for contra  Menstrual History:  No LMP recorded. Patient is not currently having periods (Reason: IUD).    Past Medical History  Diagnosis Date  . Nephrolithiasis   . Concussion   . Asthma   . Anxiety   . Depression   . Herpes   . Keratosis   . Headache(784.0)   . Postural orthostatic tachycardia syndrome     Past Surgical History  Procedure Laterality Date  . No past surgeries      Family History  Problem Relation Age of Onset  . Heart disease Neg Hx     early heart disease  . Sudden death Neg Hx     cardiac  . Cardiomyopathy Neg Hx   . Alcohol abuse Father   . Diabetes Father   . Arthritis Maternal Aunt   . Arthritis Maternal Grandmother   . COPD Maternal Grandmother   . Thyroid disease Maternal Grandmother   . Alcohol abuse Maternal Grandfather   . Cancer Maternal Grandfather     liver, pancreatic  . Cancer Paternal Grandmother     brain  . Alcohol abuse Paternal Grandfather   . Stroke Paternal Grandfather     Social History:  reports that she has never smoked. She has never used smokeless tobacco. She reports that she does not drink alcohol or use illicit drugs.  Allergies: No Known Allergies  Prescriptions prior to admission  Medication Sig Dispense Refill  . bismuth subsalicylate (PEPTO BISMOL) 262 MG chewable tablet Chew 262 mg by mouth as needed for indigestion.      Marland Kitchen ibuprofen (ADVIL,MOTRIN) 200 MG tablet Take 200 mg by mouth every 6 (six) hours as needed for mild pain.      Marland Kitchen albuterol (PROVENTIL HFA;VENTOLIN HFA) 108 (90 BASE)  MCG/ACT inhaler Inhale 2 puffs into the lungs every 6 (six) hours as needed for wheezing.  1 Inhaler  0    Review of Systems  HENT: Negative.   Respiratory: Negative.   Cardiovascular: Negative.   Gastrointestinal: Positive for nausea and abdominal pain.  Genitourinary: Negative.   Musculoskeletal: Positive for myalgias.  Skin: Negative.   Psychiatric/Behavioral: Negative.     Blood pressure 93/42, pulse 83, temperature 98.5 F (36.9 C), temperature source Oral, resp. rate 18, height 5\' 2"  (1.575 m), weight 73.483 kg (162 lb), SpO2 99.00%. Physical Exam  Constitutional: She is oriented to person, place, and time. She appears well-developed and well-nourished.  HENT:  Head: Normocephalic and atraumatic.  Cardiovascular: Normal rate and regular rhythm.   Respiratory: Effort normal and breath sounds normal. No respiratory distress. She has no wheezes.  GI: Soft. Bowel sounds are normal. She exhibits no distension. There is no tenderness.  Musculoskeletal: Normal range of motion.  Neurological: She is alert and oriented to person, place, and time.  Skin: Skin is warm and dry.  Psychiatric: She has a normal mood and affect. Her behavior is normal.    Results for orders placed during the hospital encounter of 01/18/14 (from the past 24 hour(s))  COMPREHENSIVE  METABOLIC PANEL     Status: Abnormal   Collection Time    01/18/14  6:49 PM      Result Value Ref Range   Sodium 135 (*) 137 - 147 mEq/L   Potassium 3.6 (*) 3.7 - 5.3 mEq/L   Chloride 100  96 - 112 mEq/L   CO2 20  19 - 32 mEq/L   Glucose, Bld 128 (*) 70 - 99 mg/dL   BUN 19  6 - 23 mg/dL   Creatinine, Ser 0.87  0.50 - 1.10 mg/dL   Calcium 9.3  8.4 - 10.5 mg/dL   Total Protein 7.5  6.0 - 8.3 g/dL   Albumin 4.1  3.5 - 5.2 g/dL   AST 14  0 - 37 U/L   ALT 16  0 - 35 U/L   Alkaline Phosphatase 86  39 - 117 U/L   Total Bilirubin 1.1  0.3 - 1.2 mg/dL   GFR calc non Af Amer >90  >90 mL/min   GFR calc Af Amer >90  >90 mL/min   CBC WITH DIFFERENTIAL     Status: Abnormal   Collection Time    01/18/14  6:49 PM      Result Value Ref Range   WBC 21.1 (*) 4.0 - 10.5 K/uL   RBC 5.46 (*) 3.87 - 5.11 MIL/uL   Hemoglobin 16.5 (*) 12.0 - 15.0 g/dL   HCT 46.7 (*) 36.0 - 46.0 %   MCV 85.5  78.0 - 100.0 fL   MCH 30.2  26.0 - 34.0 pg   MCHC 35.3  30.0 - 36.0 g/dL   RDW 12.8  11.5 - 15.5 %   Platelets 299  150 - 400 K/uL   Neutrophils Relative % 88 (*) 43 - 77 %   Neutro Abs 18.5 (*) 1.7 - 7.7 K/uL   Lymphocytes Relative 6 (*) 12 - 46 %   Lymphs Abs 1.4  0.7 - 4.0 K/uL   Monocytes Relative 6  3 - 12 %   Monocytes Absolute 1.2 (*) 0.1 - 1.0 K/uL   Eosinophils Relative 0  0 - 5 %   Eosinophils Absolute 0.0  0.0 - 0.7 K/uL   Basophils Relative 0  0 - 1 %   Basophils Absolute 0.0  0.0 - 0.1 K/uL  CBG MONITORING, ED     Status: Abnormal   Collection Time    01/18/14  7:05 PM      Result Value Ref Range   Glucose-Capillary 117 (*) 70 - 99 mg/dL  URINALYSIS, ROUTINE W REFLEX MICROSCOPIC     Status: Abnormal   Collection Time    01/18/14  7:12 PM      Result Value Ref Range   Color, Urine AMBER (*) YELLOW   APPearance CLOUDY (*) CLEAR   Specific Gravity, Urine 1.030  1.005 - 1.030   pH 7.5  5.0 - 8.0   Glucose, UA NEGATIVE  NEGATIVE mg/dL   Hgb urine dipstick NEGATIVE  NEGATIVE   Bilirubin Urine NEGATIVE  NEGATIVE   Ketones, ur 40 (*) NEGATIVE mg/dL   Protein, ur 100 (*) NEGATIVE mg/dL   Urobilinogen, UA 1.0  0.0 - 1.0 mg/dL   Nitrite NEGATIVE  NEGATIVE   Leukocytes, UA TRACE (*) NEGATIVE  URINE MICROSCOPIC-ADD ON     Status: Abnormal   Collection Time    01/18/14  7:12 PM      Result Value Ref Range   Squamous Epithelial / LPF MANY (*) RARE   WBC, UA  0-2  <3 WBC/hpf   RBC / HPF 0-2  <3 RBC/hpf   Bacteria, UA RARE  RARE  LIPASE, BLOOD     Status: None   Collection Time    01/18/14  7:15 PM      Result Value Ref Range   Lipase 16  11 - 59 U/L  POC URINE PREG, ED     Status: None   Collection Time     01/18/14  7:31 PM      Result Value Ref Range   Preg Test, Ur NEGATIVE  NEGATIVE  WET PREP, GENITAL     Status: Abnormal   Collection Time    01/18/14  9:38 PM      Result Value Ref Range   Yeast Wet Prep HPF POC NONE SEEN  NONE SEEN   Trich, Wet Prep NONE SEEN  NONE SEEN   Clue Cells Wet Prep HPF POC FEW (*) NONE SEEN   WBC, Wet Prep HPF POC NONE SEEN  NONE SEEN  CBC WITH DIFFERENTIAL     Status: Abnormal   Collection Time    01/19/14  2:40 AM      Result Value Ref Range   WBC 15.0 (*) 4.0 - 10.5 K/uL   RBC 4.67  3.87 - 5.11 MIL/uL   Hemoglobin 14.2  12.0 - 15.0 g/dL   HCT 40.0  36.0 - 46.0 %   MCV 85.7  78.0 - 100.0 fL   MCH 30.4  26.0 - 34.0 pg   MCHC 35.5  30.0 - 36.0 g/dL   RDW 12.9  11.5 - 15.5 %   Platelets 231  150 - 400 K/uL   Neutrophils Relative % 89 (*) 43 - 77 %   Neutro Abs 13.3 (*) 1.7 - 7.7 K/uL   Lymphocytes Relative 6 (*) 12 - 46 %   Lymphs Abs 0.9  0.7 - 4.0 K/uL   Monocytes Relative 5  3 - 12 %   Monocytes Absolute 0.8  0.1 - 1.0 K/uL   Eosinophils Relative 0  0 - 5 %   Eosinophils Absolute 0.0  0.0 - 0.7 K/uL   Basophils Relative 0  0 - 1 %   Basophils Absolute 0.0  0.0 - 0.1 K/uL    US Transvaginal Non-ob  01/18/2014   CLINICAL DATA:  Right pelvic pain since this afternoon. Nausea, vomiting, diarrhea, syncope. Rule out torsion. Intrauterine device in place.  EXAM: TRANSABDOMINAL AND TRANSVAGINAL ULTRASOUND OF PELVIS  DOPPLER ULTRASOUND OF OVARIES  TECHNIQUE: Both transabdominal and transvaginal ultrasound examinations of the pelvis were performed. Transabdominal technique was performed for global imaging of the pelvis including uterus, ovaries, adnexal regions, and pelvic cul-de-sac.  It was necessary to proceed with endovaginal exam following the transabdominal exam to visualize the ovaries. Color and duplex Doppler ultrasound was utilized to evaluate blood flow to the ovaries.  COMPARISON:  CT abdomen and pelvis 01/18/2014  FINDINGS: Uterus  Measurements:  8.2 x 4.7 x 6.8 cm, retroflexed. No fibroids or other mass visualized.  Endometrium  Intrauterine device. No evidence of significant thickening of the endometrium.  Right ovary  Measurements: 4.9 x 4.3 x 3.1 cm. Simple appearing cyst measuring about 3 cm diameter. No internal flow or septation. Consistent with simple cyst, likely physiologic. Flow is demonstrated in the right ovary on color flow Doppler imaging.  Left ovary  Measurements: 4.3 x 2 x 1.8 cm. Normal follicular changes. No abnormal adnexal masses. Flow is demonstrated in the left ovary on color  flow Doppler imaging.  Pulsed Doppler evaluation of both ovaries demonstrates normal low-resistance arterial and venous waveforms.  Other findings  No free fluid.  IMPRESSION: Normal ultrasound appearance of the uterus and ovaries. Presumed physiologic cyst in the right ovary. No evidence of ovarian torsion.   Electronically Signed   By: Burman Nieves M.D.   On: 01/18/2014 22:36   US Pelvis Complete  01/18/2014   CLINICAL DATA:  Right pelvic pain since this afternoon. Nausea, vomiting, diarrhea, syncope. Rule out torsion. Intrauterine device in place.  EXAM: TRANSABDOMINAL AND TRANSVAGINAL ULTRASOUND OF PELVIS  DOPPLER ULTRASOUND OF OVARIES  TECHNIQUE: Both transabdominal and transvaginal ultrasound examinations of the pelvis were performed. Transabdominal technique was performed for global imaging of the pelvis including uterus, ovaries, adnexal regions, and pelvic cul-de-sac.  It was necessary to proceed with endovaginal exam following the transabdominal exam to visualize the ovaries. Color and duplex Doppler ultrasound was utilized to evaluate blood flow to the ovaries.  COMPARISON:  CT abdomen and pelvis 01/18/2014  FINDINGS: Uterus  Measurements: 8.2 x 4.7 x 6.8 cm, retroflexed. No fibroids or other mass visualized.  Endometrium  Intrauterine device. No evidence of significant thickening of the endometrium.  Right ovary  Measurements: 4.9 x 4.3 x 3.1  cm. Simple appearing cyst measuring about 3 cm diameter. No internal flow or septation. Consistent with simple cyst, likely physiologic. Flow is demonstrated in the right ovary on color flow Doppler imaging.  Left ovary  Measurements: 4.3 x 2 x 1.8 cm. Normal follicular changes. No abnormal adnexal masses. Flow is demonstrated in the left ovary on color flow Doppler imaging.  Pulsed Doppler evaluation of both ovaries demonstrates normal low-resistance arterial and venous waveforms.  Other findings  No free fluid.  IMPRESSION: Normal ultrasound appearance of the uterus and ovaries. Presumed physiologic cyst in the right ovary. No evidence of ovarian torsion.   Electronically Signed   By: Burman Nieves M.D.   On: 01/18/2014 22:36   Ct Abdomen Pelvis W Contrast  01/18/2014   CLINICAL DATA:  Right abdominal pain radiating to the back  EXAM: CT ABDOMEN AND PELVIS WITH CONTRAST  TECHNIQUE: Multidetector CT imaging of the abdomen and pelvis was performed using the standard protocol following bolus administration of intravenous contrast.  CONTRAST:  OMNIPAQUE IOHEXOL 300 MG/ML  SOLN  COMPARISON:  US ABDOMEN COMPLETE dated 05/27/2011; CT UROGRAM dated 01/04/2005  FINDINGS: The liver, gallbladder, spleen, pancreas, adrenal glands are within normal limits  Tiny calculi in the upper pole collecting system of the right kidney. Tiny cysts in the right kidney.  Ovarian veins are dilated of unknown significance.  IUD is in place within the fundal endometrium. The uterus is retroflexed. 3.3 cm simple cyst in the right adnexa is likely an ovarian cyst. Unremarkable bladder.  Normal appendix, best seen on sagittal reconstruction imaging.  No free-fluid.  No abnormal retroperitoneal adenopathy.  IMPRESSION: No acute intra-abdominal pathology.  3.3 cm cyst in the right adnexa is likely a benign ovarian cyst.   Electronically Signed   By: Maryclare Bean M.D.   On: 01/18/2014 20:51   [place] Art/ven Flow Abd Pelv Doppler  01/18/2014    CLINICAL DATA:  Right pelvic pain since this afternoon. Nausea, vomiting, diarrhea, syncope. Rule out torsion. Intrauterine device in place.  EXAM: TRANSABDOMINAL AND TRANSVAGINAL ULTRASOUND OF PELVIS  DOPPLER ULTRASOUND OF OVARIES  TECHNIQUE: Both transabdominal and transvaginal ultrasound examinations of the pelvis were performed. Transabdominal technique was performed for global imaging of the pelvis  including uterus, ovaries, adnexal regions, and pelvic cul-de-sac.  It was necessary to proceed with endovaginal exam following the transabdominal exam to visualize the ovaries. Color and duplex Doppler ultrasound was utilized to evaluate blood flow to the ovaries.  COMPARISON:  CT abdomen and pelvis 01/18/2014  FINDINGS: Uterus  Measurements: 8.2 x 4.7 x 6.8 cm, retroflexed. No fibroids or other mass visualized.  Endometrium  Intrauterine device. No evidence of significant thickening of the endometrium.  Right ovary  Measurements: 4.9 x 4.3 x 3.1 cm. Simple appearing cyst measuring about 3 cm diameter. No internal flow or septation. Consistent with simple cyst, likely physiologic. Flow is demonstrated in the right ovary on color flow Doppler imaging.  Left ovary  Measurements: 4.3 x 2 x 1.8 cm. Normal follicular changes. No abnormal adnexal masses. Flow is demonstrated in the left ovary on color flow Doppler imaging.  Pulsed Doppler evaluation of both ovaries demonstrates normal low-resistance arterial and venous waveforms.  Other findings  No free fluid.  IMPRESSION: Normal ultrasound appearance of the uterus and ovaries. Presumed physiologic cyst in the right ovary. No evidence of ovarian torsion.   Electronically Signed   By: Lucienne Capers M.D.   On: 01/18/2014 22:36    Assessment/Plan: 27yo admitted for observation overnight with pelvic pain.  Received dose of Rocephin at [facility] ED.  Also doxy.  No fever.  White count decreased this AM with fluid.  Feeling better today.  Ur Cx P.  Unlikely PID, pain control  overnight and hydration  Mollyann Halbert Bovard-Stuckert 01/19/2014, 7:41 AM

## 2014-01-19 NOTE — Discharge Summary (Signed)
Physician Discharge Summary  Patient ID: [name] MRN: [id number] DOB/AGE: Dec 23, 1985 28 y.o.  Admit date: 01/18/2014 Discharge date: 01/19/2014  Admission Diagnoses:  Discharge Diagnoses:  Active Problems:   Simple ovarian cyst   Discharged Condition: stable  Hospital Course: sent to ED, appendicitis r/o, ovarian cyst noted on CT - also N/V admitted overnight, HA treated, d/c with doxy x 10 days  Consults: None  Significant Diagnostic Studies: labs: CBC and radiology: CT scan: ov cyst noted and Ultrasound: no torsion  Treatments: IV hydration and antibiotics: Rocephin and doxycycline  Discharge Exam: Blood pressure 93/42, pulse 83, temperature 98.5 F (36.9 C), temperature source Oral, resp. rate 18, height 5\' 2"  (1.575 m), weight 73.483 kg (162 lb), SpO2 99.00%. General appearance: alert and no distress Resp: clear to auscultation bilaterally Cardio: regular rate and rhythm GI: soft, non-tender; bowel sounds normal; no masses,  no organomegaly Extremities: extremities normal, atraumatic, no cyanosis or edema  Disposition: 01-Home or Self Care  Discharge Orders   Future Orders Complete By Expires   Call MD for:  persistant nausea and vomiting  As directed    Call MD for:  severe uncontrolled pain  As directed    Diet - low sodium heart healthy  As directed    Discharge instructions  As directed    Increase activity slowly  As directed    May shower / Bathe  As directed    May walk up steps  As directed        Medication List         albuterol 108 (90 BASE) MCG/ACT inhaler  Commonly known as:  PROVENTIL HFA;VENTOLIN HFA  Inhale 2 puffs into the lungs every 6 (six) hours as needed for wheezing.     bismuth subsalicylate 878 MG chewable tablet  Commonly known as:  PEPTO BISMOL  Chew 262 mg by mouth as needed for indigestion.     HYDROcodone-acetaminophen 5-325 MG per tablet  Commonly known as:  NORCO/VICODIN  Take 1-2 tablets by mouth every 6 (six) hours  as needed for moderate pain.     ibuprofen 800 MG tablet  Commonly known as:  ADVIL,MOTRIN  Take 1 tablet (800 mg total) by mouth 3 (three) times daily as needed for mild pain or moderate pain.           Follow-up Information   Follow up with Bovard-Stuckert, Jeral Fruit, MD In 3 weeks. (call if symptoms worsen)    Specialty:  Obstetrics and Gynecology   Contact information:   [address] [place] [address] [phone]       Signed: Janyth Contes 01/19/2014, 8:14 AM

## 2014-01-19 NOTE — Progress Notes (Signed)
Ur chart review completed.  

## 2014-01-19 NOTE — MAU Note (Addendum)
ON ARRIVAL-  PT A/O X3  DROWSY FROM PAIN  MED   STILL HAS SMALL AMT PAIN IN RIGHT LOWER ABD-  BUT ALSO  HAS H/A - STARTED  ON WAY  TO [place].    HAS HX OF MIGRAINES- LAST  TIME 5 YEARS AGO -    PT IS TEXTING ON PHONE  NOW-   SAYS IT DOES NOT HURT TO TEXT.

## 2014-01-19 NOTE — Progress Notes (Signed)
Pt ambulated out  Teaching complete 

## 2014-01-20 LAB — URINE CULTURE: Colony Count: 50000

## 2014-02-10 ENCOUNTER — Other Ambulatory Visit: Payer: Self-pay | Admitting: Obstetrics and Gynecology

## 2014-02-10 DIAGNOSIS — N6459 Other signs and symptoms in breast: Secondary | ICD-10-CM

## 2014-02-14 ENCOUNTER — Ambulatory Visit
Admission: RE | Admit: 2014-02-14 | Discharge: 2014-02-14 | Disposition: A | Discharge status: Home / Self Care | Payer: BC Managed Care – PPO | Source: Ambulatory Visit | Attending: Obstetrics and Gynecology | Admitting: Obstetrics and Gynecology

## 2014-02-14 DIAGNOSIS — N6459 Other signs and symptoms in breast: Secondary | ICD-10-CM

## 2014-04-05 ENCOUNTER — Ambulatory Visit (INDEPENDENT_AMBULATORY_CARE_PROVIDER_SITE_OTHER): Payer: BC Managed Care – PPO | Admitting: Family Medicine

## 2014-04-05 VITALS — BP 106/68 | HR 81 | Temp 98.3°F | Ht 62.0 in | Wt 167.6 lb

## 2014-04-05 DIAGNOSIS — H9202 Otalgia, left ear: Secondary | ICD-10-CM

## 2014-04-05 DIAGNOSIS — H9209 Otalgia, unspecified ear: Secondary | ICD-10-CM

## 2014-04-05 DIAGNOSIS — H6982 Other specified disorders of Eustachian tube, left ear: Secondary | ICD-10-CM

## 2014-04-05 DIAGNOSIS — H6980 Other specified disorders of Eustachian tube, unspecified ear: Secondary | ICD-10-CM

## 2014-04-05 MED ORDER — IBUPROFEN 600 MG PO TABS: 600.0000 mg | ORAL_TABLET | Freq: Four times a day (QID) | ORAL | Status: DC | PRN

## 2014-04-05 MED ORDER — AZITHROMYCIN 250 MG PO TABS: ORAL_TABLET | ORAL | Status: DC

## 2014-04-05 MED ORDER — IBUPROFEN 600 MG PO TABS: 600.0000 mg | ORAL_TABLET | Freq: Four times a day (QID) | ORAL | Status: AC | PRN

## 2014-04-05 NOTE — Progress Notes (Signed)
   Subjective:    Patient ID: [name], female    DOB: Mar 12, 1986, 28 y.o.   MRN: [id number]  HPI  C/o left ear discomfort after [place] [employer] and [place] and hitting her left ear. She has been having persistent left ear discomfort for last 2 days.  She states she feels like her Lymph nodes are swollen.  Review of Systems No chest pain, SOB, HA, dizziness, vision change, N/V, diarrhea, constipation, dysuria, urinary urgency or frequency, myalgias, arthralgias or rash.     Objective:   Physical Exam Vital signs noted  Well developed well nourished female.  HEENT - Head atraumatic Normocephalic                Eyes - PERRLA, Conjuctiva - clear Sclera- Clear EOMI                Ears - EAC's Wnl and Left tm retracted and right TM normal                Throat - oropharanx wnl Respiratory - Lungs CTA bilateral Cardiac - RRR S1 and S2 without murmur GI - Abdomen soft Nontender and bowel sounds active x 4       Assessment & Plan:  Otalgia of left ear - Plan: ibuprofen (ADVIL,MOTRIN) 600 MG tablet, azithromycin (ZITHROMAX) 250 MG tablet  ETD (eustachian tube dysfunction), left - Plan: ibuprofen (ADVIL,MOTRIN) 600 MG tablet, azithromycin (ZITHROMAX) 250 MG tablet  Follow up prn if sx's persist.  Lysbeth Penner FNP

## 2014-04-05 NOTE — Addendum Note (Signed)
Addended by: Lysbeth Penner on: 04/05/2014 03:52 PM   Modules accepted: Orders

## 2014-05-09 ENCOUNTER — Ambulatory Visit (INDEPENDENT_AMBULATORY_CARE_PROVIDER_SITE_OTHER): Payer: BC Managed Care – PPO | Admitting: Family Medicine

## 2014-05-09 ENCOUNTER — Encounter: Payer: Self-pay | Admitting: Family Medicine

## 2014-05-09 VITALS — BP 118/82 | HR 70 | Temp 99.6°F | Ht 62.0 in | Wt 172.8 lb

## 2014-05-09 DIAGNOSIS — H6990 Unspecified Eustachian tube disorder, unspecified ear: Secondary | ICD-10-CM

## 2014-05-09 DIAGNOSIS — H9209 Otalgia, unspecified ear: Secondary | ICD-10-CM

## 2014-05-09 DIAGNOSIS — J069 Acute upper respiratory infection, unspecified: Secondary | ICD-10-CM

## 2014-05-09 DIAGNOSIS — H6982 Other specified disorders of Eustachian tube, left ear: Secondary | ICD-10-CM

## 2014-05-09 DIAGNOSIS — H6980 Other specified disorders of Eustachian tube, unspecified ear: Secondary | ICD-10-CM

## 2014-05-09 DIAGNOSIS — H6992 Unspecified Eustachian tube disorder, left ear: Secondary | ICD-10-CM

## 2014-05-09 DIAGNOSIS — L309 Dermatitis, unspecified: Secondary | ICD-10-CM

## 2014-05-09 DIAGNOSIS — H9202 Otalgia, left ear: Secondary | ICD-10-CM

## 2014-05-09 DIAGNOSIS — L259 Unspecified contact dermatitis, unspecified cause: Secondary | ICD-10-CM

## 2014-05-09 LAB — POCT RAPID STREP A (OFFICE): Rapid Strep A Screen: NEGATIVE

## 2014-05-09 MED ORDER — METHYLPREDNISOLONE ACETATE 80 MG/ML IJ SUSP
80.0000 mg | Freq: Once | INTRAMUSCULAR | Status: AC
  Administered 2014-05-09: 80 mg via INTRAMUSCULAR

## 2014-05-09 MED ORDER — AZITHROMYCIN 250 MG PO TABS: ORAL_TABLET | ORAL | Status: DC

## 2014-05-09 MED ORDER — FLUTICASONE PROPIONATE 0.05 % EX CREA: TOPICAL_CREAM | Freq: Two times a day (BID) | CUTANEOUS | Status: DC

## 2014-05-10 NOTE — Progress Notes (Signed)
   Subjective:    Patient ID: [name], female    DOB: Jan 24, 1986, 28 y.o.   MRN: [id number]  HPI This 28 y.o. female presents for evaluation of rash and bumps on hands bilateral.  She c/o uri sx's and ear pain.   Review of Systems C/o uri and rash No chest pain, SOB, HA, dizziness, vision change, N/V, diarrhea, constipation, dysuria, urinary urgency or frequency, myalgias, arthralgias.     Objective:   Physical Exam  Vital signs noted  Well developed well nourished female.  HEENT - Head atraumatic Normocephalic                Eyes - PERRLA, Conjuctiva - clear Sclera- Clear EOMI                Ears - EAC's Wnl TM's Wnl Gross Hearing WNL                Throat - oropharanx wnl Respiratory - Lungs CTA bilateral Cardiac - RRR S1 and S2 without murmur Skin - bilateral fingers and palm with flesh colored bumps  Results for orders placed in visit on 05/09/14  POCT RAPID STREP A (OFFICE)      Result Value Ref Range   Rapid Strep A Screen Negative  Negative      Assessment & Plan:  Eczema of both hands - Plan: fluticasone (CUTIVATE) 0.05 % cream  Otalgia of left ear - Plan: azithromycin (ZITHROMAX) 250 MG tablet, methylPREDNISolone acetate (DEPO-MEDROL) injection 80 mg  ETD (eustachian tube dysfunction), left - Plan: azithromycin (ZITHROMAX) 250 MG tablet, methylPREDNISolone acetate (DEPO-MEDROL) injection 80 mg  URI (upper respiratory infection) - Plan: azithromycin (ZITHROMAX) 250 MG tablet, POCT rapid strep A  Push po fluids, rest, tylenol and motrin otc prn as directed for fever, arthralgias, and myalgias.  Follow up prn if sx's continue or persist.  Lysbeth Penner FNP

## 2014-05-24 ENCOUNTER — Telehealth: Payer: Self-pay | Admitting: Nurse Practitioner

## 2014-05-26 NOTE — Telephone Encounter (Signed)
Patient wanted to see if there are appetite suppressants that she can take. Appt to discuss weight is necessary. Appt scheduled. Patient aware.

## 2014-06-23 ENCOUNTER — Ambulatory Visit: Payer: BC Managed Care – PPO | Admitting: Family Medicine

## 2014-08-01 ENCOUNTER — Encounter: Payer: Self-pay | Admitting: Family Medicine

## 2014-10-03 ENCOUNTER — Telehealth: Payer: Self-pay | Admitting: Nurse Practitioner

## 2014-10-03 NOTE — Telephone Encounter (Signed)
Stp she was started on flagyl for BV by her gyn dr, noticed some mucousy stool this morning with BM, advised no appts today but can call back first thing in the morning to check for any cancellations, pt voiced understanding.

## 2014-11-09 ENCOUNTER — Ambulatory Visit (INDEPENDENT_AMBULATORY_CARE_PROVIDER_SITE_OTHER): Payer: BLUE CROSS/BLUE SHIELD | Admitting: Family Medicine

## 2014-11-09 VITALS — BP 117/87 | HR 88 | Temp 97.4°F | Ht 62.0 in | Wt 171.0 lb

## 2014-11-09 DIAGNOSIS — J206 Acute bronchitis due to rhinovirus: Secondary | ICD-10-CM

## 2014-11-09 DIAGNOSIS — H9202 Otalgia, left ear: Secondary | ICD-10-CM

## 2014-11-09 DIAGNOSIS — H6982 Other specified disorders of Eustachian tube, left ear: Secondary | ICD-10-CM

## 2014-11-09 DIAGNOSIS — J069 Acute upper respiratory infection, unspecified: Secondary | ICD-10-CM

## 2014-11-09 MED ORDER — AMOXICILLIN 875 MG PO TABS: 875.0000 mg | ORAL_TABLET | Freq: Two times a day (BID) | ORAL | Status: DC

## 2014-11-09 MED ORDER — PREDNISONE 20 MG PO TABS
20.0000 mg | ORAL_TABLET | Freq: Every day | ORAL | Status: DC
Start: 2014-11-09 — End: 2014-11-30

## 2014-11-09 MED ORDER — BENZONATATE 100 MG PO CAPS: 100.0000 mg | ORAL_CAPSULE | Freq: Three times a day (TID) | ORAL | Status: DC | PRN

## 2014-11-09 MED ORDER — ALBUTEROL SULFATE HFA 108 (90 BASE) MCG/ACT IN AERS: 2.0000 | INHALATION_SPRAY | Freq: Four times a day (QID) | RESPIRATORY_TRACT | Status: DC | PRN

## 2014-11-09 NOTE — Patient Instructions (Signed)

## 2014-11-09 NOTE — Progress Notes (Signed)
   Subjective:    Patient ID: [name], female    DOB: [birth year], 29 y.o.   MRN: [id number]  HPI Patient c/o cough  Review of Systems  Constitutional: Negative for fever.  HENT: Negative for ear pain.   Eyes: Negative for discharge.  Respiratory: Negative for cough.   Cardiovascular: Negative for chest pain.  Gastrointestinal: Negative for abdominal distention.  Endocrine: Negative for polyuria.  Genitourinary: Negative for difficulty urinating.  Musculoskeletal: Negative for gait problem and neck pain.  Skin: Negative for color change and rash.  Neurological: Negative for speech difficulty and headaches.  Psychiatric/Behavioral: Negative for agitation.       Objective:    BP 117/87 mmHg  Pulse 88  Temp(Src) 97.4 F (36.3 C) (Oral)  Ht 5\' 2"  (1.575 m)  Wt 171 lb (77.565 kg)  BMI 31.27 kg/m2 Physical Exam  Constitutional: She is oriented to person, place, and time. She appears well-developed and well-nourished.  HENT:  Head: Normocephalic and atraumatic.  Mouth/Throat: Oropharynx is clear and moist.  Eyes: Pupils are equal, round, and reactive to light.  Neck: Normal range of motion. Neck supple.  Cardiovascular: Normal rate and regular rhythm.   No murmur heard. Pulmonary/Chest: Effort normal and breath sounds normal.  Abdominal: Soft. Bowel sounds are normal. There is no tenderness.  Neurological: She is alert and oriented to person, place, and time.  Skin: Skin is warm and dry.  Psychiatric: She has a normal mood and affect.          Assessment & Plan:     ICD-9-CM ICD-10-CM   1. Otalgia of left ear 388.70 H92.02   2. ETD (eustachian tube dysfunction), left 381.81 H69.82   3. URI (upper respiratory infection) 465.9 J06.9 predniSONE (DELTASONE) 20 MG tablet     albuterol (PROVENTIL HFA;VENTOLIN HFA) 108 (90 BASE) MCG/ACT inhaler     benzonatate (TESSALON PERLES) 100 MG capsule     amoxicillin (AMOXIL) 875 MG tablet  4. Acute bronchitis due to  Rhinovirus 466.0 J20.6 predniSONE (DELTASONE) 20 MG tablet   079.3  albuterol (PROVENTIL HFA;VENTOLIN HFA) 108 (90 BASE) MCG/ACT inhaler     benzonatate (TESSALON PERLES) 100 MG capsule     amoxicillin (AMOXIL) 875 MG tablet     levalbuterol (XOPENEX) nebulizer solution 1.26 mg     No Follow-up on file.  Lysbeth Penner FNP

## 2014-11-10 MED ORDER — LEVALBUTEROL HCL 0.63 MG/3ML IN NEBU
1.2600 mg | INHALATION_SOLUTION | Freq: Once | RESPIRATORY_TRACT | Status: AC
  Administered 2014-11-09: 1.26 mg via RESPIRATORY_TRACT

## 2014-11-30 ENCOUNTER — Encounter: Payer: Self-pay | Admitting: Family

## 2014-11-30 ENCOUNTER — Ambulatory Visit (INDEPENDENT_AMBULATORY_CARE_PROVIDER_SITE_OTHER): Payer: BLUE CROSS/BLUE SHIELD | Admitting: Family

## 2014-11-30 VITALS — BP 129/92 | HR 89 | Temp 97.9°F | Ht 62.0 in | Wt 178.2 lb

## 2014-11-30 DIAGNOSIS — K219 Gastro-esophageal reflux disease without esophagitis: Secondary | ICD-10-CM | POA: Diagnosis not present

## 2014-11-30 DIAGNOSIS — R1012 Left upper quadrant pain: Secondary | ICD-10-CM

## 2014-11-30 DIAGNOSIS — K2970 Gastritis, unspecified, without bleeding: Secondary | ICD-10-CM

## 2014-11-30 LAB — POCT CBC
Granulocyte percent: 71.9 %G (ref 37–80)
HCT, POC: 47.4 % (ref 37.7–47.9)
HEMOGLOBIN: 14.8 g/dL (ref 12.2–16.2)
Lymph, poc: 2.4 (ref 0.6–3.4)
MCH: 27.6 pg (ref 27–31.2)
MCHC: 31.2 g/dL — AB (ref 31.8–35.4)
MCV: 88.7 fL (ref 80–97)
MPV: 7.8 fL (ref 0–99.8)
POC Granulocyte: 7.4 — AB (ref 2–6.9)
POC LYMPH %: 23.6 % (ref 10–50)
Platelet Count, POC: 298 10*3/uL (ref 142–424)
RBC: 5.34 M/uL (ref 4.04–5.48)
RDW, POC: 12.6 %
WBC: 10.3 10*3/uL — AB (ref 4.6–10.2)

## 2014-11-30 MED ORDER — OMEPRAZOLE 20 MG PO CPDR: 20.0000 mg | DELAYED_RELEASE_CAPSULE | Freq: Two times a day (BID) | ORAL | Status: DC

## 2014-11-30 NOTE — Patient Instructions (Signed)

## 2014-11-30 NOTE — Progress Notes (Signed)
   Subjective:    Patient ID: [name], female    DOB: 30-Jan-1986, 29 y.o.   MRN: [id number]  Abdominal Pain This is a recurrent problem. The current episode started 1 to 4 weeks ago. The onset quality is gradual. The problem occurs intermittently. The problem has been gradually worsening. The pain is located in the epigastric region. The pain is at a severity of 4/10. The pain is mild. The quality of the pain is sharp. Pertinent negatives include no belching, constipation, diarrhea, dysuria, flatus, headaches, hematochezia, myalgias, nausea or vomiting. Associated symptoms comments: Heartburn . She has tried antacids for the symptoms. The treatment provided mild relief. There is no history of GERD.   Pt reports taking at least 600mg  of motrin daily. "Some times more".    Review of Systems  Constitutional: Negative.   HENT: Negative.   Eyes: Negative.   Respiratory: Negative.  Negative for shortness of breath.   Cardiovascular: Negative.  Negative for palpitations.  Gastrointestinal: Positive for abdominal pain. Negative for nausea, vomiting, diarrhea, constipation, hematochezia and flatus.  Endocrine: Negative.   Genitourinary: Negative.  Negative for dysuria.  Musculoskeletal: Negative.  Negative for myalgias.  Neurological: Negative.  Negative for headaches.  Hematological: Negative.   Psychiatric/Behavioral: Negative.   All other systems reviewed and are negative.      Objective:   Physical Exam  Constitutional: She is oriented to person, place, and time. She appears well-developed and well-nourished. No distress.  HENT:  Head: Normocephalic and atraumatic.  Right Ear: External ear normal.  Mouth/Throat: Oropharynx is clear and moist.  Eyes: Pupils are equal, round, and reactive to light.  Neck: Normal range of motion. Neck supple. No thyromegaly present.  Cardiovascular: Normal rate, regular rhythm, normal heart sounds and intact distal pulses.   No murmur  heard. Pulmonary/Chest: Effort normal and breath sounds normal. No respiratory distress. She has no wheezes.  Abdominal: Soft. Bowel sounds are normal. She exhibits no distension. There is tenderness (Mild tenderness in LUQ).  Musculoskeletal: Normal range of motion. She exhibits no edema or tenderness.  Neurological: She is alert and oriented to person, place, and time. She has normal reflexes. No cranial nerve deficit.  Skin: Skin is warm and dry.  Psychiatric: She has a normal mood and affect. Her behavior is normal. Judgment and thought content normal.  Vitals reviewed.   BP 129/92 mmHg  Pulse 89  Temp(Src) 97.9 F (36.6 C) (Oral)  Ht 5\' 2"  (1.575 m)  Wt 178 lb 3.2 oz (80.831 kg)  BMI 32.58 kg/m2       Assessment & Plan:  1. LUQ abdominal pain - POCT CBC - H Pylori, IGM, IGG, IGA AB  2. Gastroesophageal reflux disease without esophagitis  3. Gastritis - omeprazole (PRILOSEC) 20 MG capsule; Take 1 capsule (20 mg total) by mouth 2 (two) times daily before a meal.  Dispense: 60 capsule; Refill: 3  No spicy foods No more NSAIDS RTO prn  [name], FNP

## 2014-12-02 LAB — H PYLORI, IGM, IGG, IGA AB
H Pylori IgG: <0.9 U/mL (ref 0.0–0.8)
H. pylori, IgA Abs: <9 units (ref 0.0–8.9)
H. pylori, IgM Abs: <9 units (ref 0.0–8.9)

## 2014-12-19 ENCOUNTER — Ambulatory Visit (INDEPENDENT_AMBULATORY_CARE_PROVIDER_SITE_OTHER): Payer: BLUE CROSS/BLUE SHIELD | Admitting: Family

## 2014-12-19 ENCOUNTER — Encounter: Payer: Self-pay | Admitting: Family

## 2014-12-19 VITALS — BP 110/75 | HR 64 | Temp 98.1°F | Ht 62.0 in | Wt 176.0 lb

## 2014-12-19 DIAGNOSIS — J4530 Mild persistent asthma, uncomplicated: Secondary | ICD-10-CM | POA: Diagnosis not present

## 2014-12-19 DIAGNOSIS — J45909 Unspecified asthma, uncomplicated: Secondary | ICD-10-CM | POA: Insufficient documentation

## 2014-12-19 DIAGNOSIS — Z713 Dietary counseling and surveillance: Secondary | ICD-10-CM | POA: Diagnosis not present

## 2014-12-19 MED ORDER — PHENTERMINE HCL 37.5 MG PO CAPS: 37.5000 mg | ORAL_CAPSULE | ORAL | Status: DC

## 2014-12-19 MED ORDER — MONTELUKAST SODIUM 10 MG PO TABS: 10.0000 mg | ORAL_TABLET | Freq: Every day | ORAL | Status: DC

## 2014-12-19 NOTE — Progress Notes (Signed)
   Subjective:    Patient ID: [name], female    DOB: [birth year], 29 y.o.   MRN: [id number]  Pt presents to the office to discuss weight loss. Pt states she has been on Apex in 2009 and it helped with her appetite. Pt states she is working out at [employer], but does seem to be losing any weight. States several weeks out of the months she is "starving and can't seem to get enough food". She is also complaining of s/s of asthma. Asthma She complains of frequent throat clearing, shortness of breath (exercise), sputum production and wheezing. There is no chest tightness, difficulty breathing or hoarse voice. This is a chronic problem. The current episode started more than 1 year ago. The problem occurs intermittently. The problem has been waxing and waning. Pertinent negatives include no headaches. Her symptoms are aggravated by exercise, strenuous activity, URI and pollen. She reports moderate improvement on treatment. Her past medical history is significant for asthma.     Review of Systems  Constitutional: Negative.   HENT: Negative.  Negative for hoarse voice.   Eyes: Negative.   Respiratory: Positive for sputum production, shortness of breath (exercise) and wheezing.   Cardiovascular: Negative.  Negative for palpitations.  Gastrointestinal: Negative.   Endocrine: Negative.   Genitourinary: Negative.   Musculoskeletal: Negative.   Neurological: Negative.  Negative for headaches.  Hematological: Negative.   Psychiatric/Behavioral: Negative.   All other systems reviewed and are negative.      Objective:   Physical Exam  Constitutional: She is oriented to person, place, and time. She appears well-developed and well-nourished. No distress.  HENT:  Head: Normocephalic and atraumatic.  Right Ear: External ear normal.  Left Ear: External ear normal.  Nose: Nose normal.  Mouth/Throat: Oropharynx is clear and moist.  Eyes: Pupils are equal, round, and reactive to light.  Neck: Normal  range of motion. Neck supple. No thyromegaly present.  Cardiovascular: Normal rate, regular rhythm, normal heart sounds and intact distal pulses.   No murmur heard. Pulmonary/Chest: Effort normal and breath sounds normal. No respiratory distress. She has no wheezes.  Abdominal: Soft. Bowel sounds are normal. She exhibits no distension. There is no tenderness.  Musculoskeletal: Normal range of motion. She exhibits no edema or tenderness.  Neurological: She is alert and oriented to person, place, and time. She has normal reflexes. No cranial nerve deficit.  Skin: Skin is warm and dry.  Psychiatric: She has a normal mood and affect. Her behavior is normal. Judgment and thought content normal.  Vitals reviewed.   BP 110/75 mmHg  Pulse 64  Temp(Src) 98.1 F (36.7 C) (Oral)  Ht $R'5\' 2"'kv$  (1.575 m)  Wt 176 lb (79.833 kg)  BMI 32.18 kg/m2       Assessment & Plan:  1. Asthma, chronic, mild persistent, uncomplicated -Use albuterol as needed - montelukast (SINGULAIR) 10 MG tablet; Take 1 tablet (10 mg total) by mouth at bedtime.  Dispense: 30 tablet; Refill: 3 - [id number]+EGFR  2. Weight loss counseling, encounter for -Healthy eating and exercise discussed -Continue going to gym -RTO 3 months- Pt needs to have lost at least 5% of weight loss to continue medication  - phentermine 37.5 MG capsule; Take 1 capsule (37.5 mg total) by mouth every morning.  Dispense: 30 capsule; Refill: 2 - [place], FNP

## 2014-12-19 NOTE — Patient Instructions (Signed)
Exercise to Lose Weight Exercise and a healthy diet may help you lose weight. Your doctor may suggest specific exercises. EXERCISE IDEAS AND TIPS  Choose low-cost things you enjoy doing, such as walking, bicycling, or exercising to workout videos.  Take stairs instead of the elevator.  Walk during your lunch break.  Park your car further away from work or school.  Go to a gym or an exercise class.  Start with 5 to 10 minutes of exercise each day. Build up to 30 minutes of exercise 4 to 6 days a week.  Wear shoes with good support and comfortable clothes.  Stretch before and after working out.  Work out until you breathe harder and your heart beats faster.  Drink extra water when you exercise.  Do not do so much that you hurt yourself, feel dizzy, or get very short of breath. Exercises that burn about 150 calories:  Running 1  miles in 15 minutes.  Playing volleyball for 45 to 60 minutes.  Washing and waxing a car for 45 to 60 minutes.  Playing touch football for 45 minutes.  Walking 1  miles in 35 minutes.  Pushing a stroller 1  miles in 30 minutes.  Playing basketball for 30 minutes.  Raking leaves for 30 minutes.  Bicycling 5 miles in 30 minutes.  Walking 2 miles in 30 minutes.  Dancing for 30 minutes.  Shoveling snow for 15 minutes.  Swimming laps for 20 minutes.  Walking up stairs for 15 minutes.  Bicycling 4 miles in 15 minutes.  Gardening for 30 to 45 minutes.  Jumping rope for 15 minutes.  Washing windows or floors for 45 to 60 minutes. Document Released: 10/19/2010 Document Revised: 12/09/2011 Document Reviewed: 10/19/2010 [employer] Patient Information 2015 [employer]. This information is not intended to replace advice given to you by your health care provider. Make sure you discuss any questions you have with your health care provider. Calorie Counting for Weight Loss Calories are energy you get from the things you eat and drink. Your  body uses this energy to keep you going throughout the day. The number of calories you eat affects your weight. When you eat more calories than your body needs, your body stores the extra calories as fat. When you eat fewer calories than your body needs, your body burns fat to get the energy it needs. Calorie counting means keeping track of how many calories you eat and drink each day. If you make sure to eat fewer calories than your body needs, you should lose weight. In order for calorie counting to work, you will need to eat the number of calories that are right for you in a day to lose a healthy amount of weight per week. A healthy amount of weight to lose per week is usually 1-2 lb (0.5-0.9 kg). A dietitian can determine how many calories you need in a day and give you suggestions on how to reach your calorie goal.  WHAT IS MY MY PLAN? My goal is to have __________ calories per day.  If I have this many calories per day, I should lose around __________ pounds per week. WHAT DO I NEED TO KNOW ABOUT CALORIE COUNTING? In order to meet your daily calorie goal, you will need to:  Find out how many calories are in each food you would like to eat. Try to do this before you eat.  Decide how much of the food you can eat.  Write down what you ate and   how many calories it had. Doing this is called keeping a food log. WHERE DO I FIND CALORIE INFORMATION? The number of calories in a food can be found on a Nutrition Facts label. Note that all the information on a label is based on a specific serving of the food. If a food does not have a Nutrition Facts label, try to look up the calories online or ask your dietitian for help. HOW DO I DECIDE HOW MUCH TO EAT? To decide how much of the food you can eat, you will need to consider both the number of calories in one serving and the size of one serving. This information can be found on the Nutrition Facts label. If a food does not have a Nutrition Facts label, look  up the information online or ask your dietitian for help. Remember that calories are listed per serving. If you choose to have more than one serving of a food, you will have to multiply the calories per serving by the amount of servings you plan to eat. For example, the label on a package of bread might say that a serving size is 1 slice and that there are 90 calories in a serving. If you eat 1 slice, you will have eaten 90 calories. If you eat 2 slices, you will have eaten 180 calories. HOW DO I KEEP A FOOD LOG? After each meal, record the following information in your food log:  What you ate.  How much of it you ate.  How many calories it had.  Then, add up your calories. Keep your food log near you, such as in a small notebook in your pocket. Another option is to use a mobile app or website. Some programs will calculate calories for you and show you how many calories you have left each time you add an item to the log. WHAT ARE SOME CALORIE COUNTING TIPS?  Use your calories on foods and drinks that will fill you up and not leave you hungry. Some examples of this include foods like nuts and nut butters, vegetables, lean proteins, and high-fiber foods (more than 5 g fiber per serving).  Eat nutritious foods and avoid empty calories. Empty calories are calories you get from foods or beverages that do not have many nutrients, such as candy and soda. It is better to have a nutritious high-calorie food (such as an avocado) than a food with few nutrients (such as a bag of chips).  Know how many calories are in the foods you eat most often. This way, you do not have to look up how many calories they have each time you eat them.  Look out for foods that may seem like low-calorie foods but are really high-calorie foods, such as baked goods, soda, and fat-free candy.  Pay attention to calories in drinks. Drinks such as sodas, specialty coffee drinks, alcohol, and juices have a lot of calories yet do  not fill you up. Choose low-calorie drinks like water and diet drinks.  Focus your calorie counting efforts on higher calorie items. Logging the calories in a garden salad that contains only vegetables is less important than calculating the calories in a milk shake.  Find a way of tracking calories that works for you. Get creative. Most people who are successful find ways to keep track of how much they eat in a day, even if they do not count every calorie. WHAT ARE SOME PORTION CONTROL TIPS?  Know how many calories are in a   serving. This will help you know how many servings of a certain food you can have.  Use a measuring cup to measure serving sizes. This is helpful when you start out. With time, you will be able to estimate serving sizes for some foods.  Take some time to put servings of different foods on your favorite plates, bowls, and cups so you know what a serving looks like.  Try not to eat straight from a bag or box. Doing this can lead to overeating. Put the amount you would like to eat in a cup or on a plate to make sure you are eating the right portion.  Use smaller plates, glasses, and bowls to prevent overeating. This is a quick and easy way to practice portion control. If your plate is smaller, less food can fit on it.  Try not to multitask while eating, such as watching TV or using your computer. If it is time to eat, sit down at a table and enjoy your food. Doing this will help you to start recognizing when you are full. It will also make you more aware of what and how much you are eating. HOW CAN I CALORIE COUNT WHEN EATING OUT?  Ask for smaller portion sizes or child-sized portions.  Consider sharing an entree and sides instead of getting your own entree.  If you get your own entree, eat only half. Ask for a box at the beginning of your meal and put the rest of your entree in it so you are not tempted to eat it.  Look for the calories on the menu. If calories are listed,  choose the lower calorie options.  Choose dishes that include vegetables, fruits, whole grains, low-fat dairy products, and lean protein. Focusing on smart food choices from each of the 5 food groups can help you stay on track at restaurants.  Choose items that are boiled, broiled, grilled, or steamed.  Choose water, milk, unsweetened iced tea, or other drinks without added sugars. If you want an alcoholic beverage, choose a lower calorie option. For example, a regular margarita can have up to 700 calories and a glass of wine has around 150.  Stay away from items that are buttered, battered, fried, or served with cream sauce. Items labeled "crispy" are usually fried, unless stated otherwise.  Ask for dressings, sauces, and syrups on the side. These are usually very high in calories, so do not eat much of them.  Watch out for salads. Many people think salads are a healthy option, but this is often not the case. Many salads come with bacon, fried chicken, lots of cheese, fried chips, and dressing. All of these items have a lot of calories. If you want a salad, choose a garden salad and ask for grilled meats or steak. Ask for the dressing on the side, or ask for olive oil and vinegar or lemon to use as dressing.  Estimate how many servings of a food you are given. For example, a serving of cooked rice is  cup or about the size of half a tennis ball or one cupcake wrapper. Knowing serving sizes will help you be aware of how much food you are eating at restaurants. The list below tells you how big or small some common portion sizes are based on everyday objects.  1 oz--4 stacked dice.  3 oz--1 deck of cards.  1 tsp--1 dice.  1 Tbsp-- a Ping-Pong ball.  2 Tbsp--1 Ping-Pong ball.   cup--1 tennis ball   or 1 cupcake wrapper.  1 cup--1 baseball. Document Released: 09/16/2005 Document Revised: 01/31/2014 Document Reviewed: 07/22/2013 [employer] Patient Information 2015 [place], [place]. This  information is not intended to replace advice given to you by your health care provider. Make sure you discuss any questions you have with your health care provider. Phentermine tablets or capsules What is this medicine? PHENTERMINE (FEN ter meen) decreases your appetite. It is used with a reduced calorie diet and exercise to help you lose weight. This medicine may be used for other purposes; ask your health care provider or pharmacist if you have questions. COMMON BRAND NAME(S): Adipex-P, Atti-Plex P, Atti-Plex P Spansule, Fastin, Pro-Fast, Tara-8 What should I tell my health care provider before I take this medicine? They need to know if you have any of these conditions: -agitation -glaucoma -heart disease -high blood pressure -history of substance abuse -lung disease called Primary Pulmonary Hypertension (PPH) -taken an MAOI like Carbex, Eldepryl, Marplan, Nardil, or Parnate in last 14 days -thyroid disease -an unusual or allergic reaction to phentermine, other medicines, foods, dyes, or preservatives -pregnant or trying to get pregnant -breast-feeding How should I use this medicine? Take this medicine by mouth with a glass of water. Follow the directions on the prescription label. This medicine is usually taken 30 minutes before or 1 to 2 hours after breakfast. Avoid taking this medicine in the evening. It may interfere with sleep. Take your doses at regular intervals. Do not take your medicine more often than directed. Talk to your pediatrician regarding the use of this medicine in children. Special care may be needed. Overdosage: If you think you have taken too much of this medicine contact a poison control center or emergency room at once. NOTE: This medicine is only for you. Do not share this medicine with others. What if I miss a dose? If you miss a dose, take it as soon as you can. If it is almost time for your next dose, take only that dose. Do not take double or extra  doses. What may interact with this medicine? Do not take this medicine with any of the following medications: -duloxetine -MAOIs like Carbex, Eldepryl, Marplan, Nardil, and Parnate -medicines for colds or breathing difficulties like pseudoephedrine or phenylephrine -procarbazine -sibutramine -SSRIs like citalopram, escitalopram, fluoxetine, fluvoxamine, paroxetine, and sertraline -stimulants like dexmethylphenidate, methylphenidate or modafinil -venlafaxine This medicine may also interact with the following medications: -medicines for diabetes This list may not describe all possible interactions. Give your health care provider a list of all the medicines, herbs, non-prescription drugs, or dietary supplements you use. Also tell them if you smoke, drink alcohol, or use illegal drugs. Some items may interact with your medicine. What should I watch for while using this medicine? Notify your physician immediately if you become short of breath while doing your normal activities. Do not take this medicine within 6 hours of bedtime. It can keep you from getting to sleep. Avoid drinks that contain caffeine and try to stick to a regular bedtime every night. This medicine was intended to be used in addition to a healthy diet and exercise. The best results are achieved this way. This medicine is only indicated for short-term use. Eventually your weight loss may level out. At that point, the drug will only help you maintain your new weight. Do not increase or in any way change your dose without consulting your doctor. You may get drowsy or dizzy. Do not drive, use machinery, or do anything that needs mental alertness  until you know how this medicine affects you. Do not stand or sit up quickly, especially if you are an older patient. This reduces the risk of dizzy or fainting spells. Alcohol may increase dizziness and drowsiness. Avoid alcoholic drinks. What side effects may I notice from receiving this  medicine? Side effects that you should report to your doctor or health care professional as soon as possible: -chest pain, palpitations -depression or severe changes in mood -increased blood pressure -irritability -nervousness or restlessness -severe dizziness -shortness of breath -problems urinating -unusual swelling of the legs -vomiting Side effects that usually do not require medical attention (report to your doctor or health care professional if they continue or are bothersome): -blurred vision or other eye problems -changes in sexual ability or desire -constipation or diarrhea -difficulty sleeping -dry mouth or unpleasant taste -headache -nausea This list may not describe all possible side effects. Call your doctor for medical advice about side effects. You may report side effects to FDA at 1-800-FDA-1088. Where should I keep my medicine? Keep out of the reach of children. This medicine can be abused. Keep your medicine in a safe place to protect it from theft. Do not share this medicine with anyone. Selling or giving away this medicine is dangerous and against the law. Store at room temperature between 20 and 25 degrees C (68 and 77 degrees F). Keep container tightly closed. Throw away any unused medicine after the expiration date. NOTE: This sheet is a summary. It may not cover all possible information. If you have questions about this medicine, talk to your doctor, pharmacist, or health care provider.  2015, [employer]. (2010-10-31 11:02:44) Asthma Asthma is a recurring condition in which the airways tighten and narrow. Asthma can make it difficult to breathe. It can cause coughing, wheezing, and shortness of breath. Asthma episodes, also called asthma attacks, range from minor to life-threatening. Asthma cannot be cured, but medicines and lifestyle changes can help control it. CAUSES Asthma is believed to be caused by inherited (genetic) and environmental factors,  but its exact cause is unknown. Asthma may be triggered by allergens, lung infections, or irritants in the air. Asthma triggers are different for each person. Common triggers include:   Animal dander.  Dust mites.  Cockroaches.  Pollen from trees or grass.  Mold.  Smoke.  Air pollutants such as dust, household cleaners, hair sprays, aerosol sprays, paint fumes, strong chemicals, or strong odors.  Cold air, weather changes, and winds (which increase molds and pollens in the air).  Strong emotional expressions such as crying or laughing hard.  Stress.  Certain medicines (such as aspirin) or types of drugs (such as beta-blockers).  Sulfites in foods and drinks. Foods and drinks that may contain sulfites include dried fruit, potato chips, and sparkling grape juice.  Infections or inflammatory conditions such as the flu, a cold, or an inflammation of the nasal membranes (rhinitis).  Gastroesophageal reflux disease (GERD).  Exercise or strenuous activity. SYMPTOMS Symptoms may occur immediately after asthma is triggered or many hours later. Symptoms include:  Wheezing.  Excessive nighttime or early morning coughing.  Frequent or severe coughing with a common cold.  Chest tightness.  Shortness of breath. DIAGNOSIS  The diagnosis of asthma is made by a review of your medical history and a physical exam. Tests may also be performed. These may include:  Lung function studies. These tests show how much air you breathe in and out.  Allergy tests.  Imaging tests such as X-rays. TREATMENT  Asthma cannot be cured, but it can usually be controlled. Treatment involves identifying and avoiding your asthma triggers. It also involves medicines. There are 2 classes of medicine used for asthma treatment:   Controller medicines. These prevent asthma symptoms from occurring. They are usually taken every day.  Reliever or rescue medicines. These quickly relieve asthma symptoms. They  are used as needed and provide short-term relief. Your health care provider will help you create an asthma action plan. An asthma action plan is a written plan for managing and treating your asthma attacks. It includes a list of your asthma triggers and how they may be avoided. It also includes information on when medicines should be taken and when their dosage should be changed. An action plan may also involve the use of a device called a peak flow meter. A peak flow meter measures how well the lungs are working. It helps you monitor your condition. HOME CARE INSTRUCTIONS   Take medicines only as directed by your health care provider. Speak with your health care provider if you have questions about how or when to take the medicines.  Use a peak flow meter as directed by your health care provider. Record and keep track of readings.  Understand and use the action plan to help minimize or stop an asthma attack without needing to seek medical care.  Control your home environment in the following ways to help prevent asthma attacks:  Do not smoke. Avoid being exposed to secondhand smoke.  Change your heating and air conditioning filter regularly.  Limit your use of fireplaces and wood stoves.  Get rid of pests (such as roaches and mice) and their droppings.  Throw away plants if you see mold on them.  Clean your floors and dust regularly. Use unscented cleaning products.  Try to have someone else vacuum for you regularly. Stay out of rooms while they are being vacuumed and for a short while afterward. If you vacuum, use a dust mask from a hardware store, a double-layered or microfilter vacuum cleaner bag, or a vacuum cleaner with a HEPA filter.  Replace carpet with wood, tile, or vinyl flooring. Carpet can trap dander and dust.  Use allergy-proof pillows, mattress covers, and box spring covers.  Wash bed sheets and blankets every week in hot water and dry them in a dryer.  Use blankets  that are made of polyester or cotton.  Clean bathrooms and kitchens with bleach. If possible, have someone repaint the walls in these rooms with mold-resistant paint. Keep out of the rooms that are being cleaned and painted.  Wash hands frequently. SEEK MEDICAL CARE IF:   You have wheezing, shortness of breath, or a cough even if taking medicine to prevent attacks.  The colored mucus you cough up (sputum) is thicker than usual.  Your sputum changes from clear or white to yellow, green, gray, or bloody.  You have any problems that may be related to the medicines you are taking (such as a rash, itching, swelling, or trouble breathing).  You are using a reliever medicine more than 2-3 times per week.  Your peak flow is still at 50-79% of your personal best after following your action plan for 1 hour.  You have a fever. SEEK IMMEDIATE MEDICAL CARE IF:   You seem to be getting worse and are unresponsive to treatment during an asthma attack.  You are short of breath even at rest.  You get short of breath when doing very little physical activity.  You have difficulty eating, drinking, or talking due to asthma symptoms.  You develop chest pain.  You develop a fast heartbeat.  You have a bluish color to your lips or fingernails.  You are light-headed, dizzy, or faint.  Your peak flow is less than 50% of your personal best. MAKE SURE YOU:   Understand these instructions.  Will watch your condition.  Will get help right away if you are not doing well or get worse. Document Released: 09/16/2005 Document Revised: 01/31/2014 Document Reviewed: 04/15/2013 [facility] Patient Information 2015 [place], [place]. This information is not intended to replace advice given to you by your health care provider. Make sure you discuss any questions you have with your health care provider.

## 2014-12-20 LAB — CMP14+EGFR
A/G RATIO: 1.7 (ref 1.1–2.5)
ALBUMIN: 4.3 g/dL (ref 3.5–5.5)
ALT: 22 IU/L (ref 0–32)
AST: 14 IU/L (ref 0–40)
Alkaline Phosphatase: 67 IU/L (ref 39–117)
BILIRUBIN TOTAL: 0.4 mg/dL (ref 0.0–1.2)
BUN/Creatinine Ratio: 11 (ref 8–20)
BUN: 9 mg/dL (ref 6–20)
CO2: 24 mmol/L (ref 18–29)
CREATININE: 0.85 mg/dL (ref 0.57–1.00)
Calcium: 9.4 mg/dL (ref 8.7–10.2)
Chloride: 101 mmol/L (ref 97–108)
GFR calc non Af Amer: 94 mL/min/{1.73_m2} (ref >59)
GFR, EST AFRICAN AMERICAN: 109 mL/min/{1.73_m2} (ref >59)
GLOBULIN, TOTAL: 2.6 g/dL (ref 1.5–4.5)
Glucose: 97 mg/dL (ref 65–99)
Potassium: 4.3 mmol/L (ref 3.5–5.2)
Sodium: 140 mmol/L (ref 134–144)
Total Protein: 6.9 g/dL (ref 6.0–8.5)

## 2015-02-14 ENCOUNTER — Encounter: Payer: Self-pay | Admitting: Family

## 2015-02-14 ENCOUNTER — Ambulatory Visit (INDEPENDENT_AMBULATORY_CARE_PROVIDER_SITE_OTHER): Payer: BLUE CROSS/BLUE SHIELD | Admitting: Family

## 2015-02-14 VITALS — BP 125/86 | HR 86 | Temp 97.6°F | Ht 62.0 in | Wt 154.4 lb

## 2015-02-14 DIAGNOSIS — J309 Allergic rhinitis, unspecified: Secondary | ICD-10-CM | POA: Insufficient documentation

## 2015-02-14 DIAGNOSIS — J4530 Mild persistent asthma, uncomplicated: Secondary | ICD-10-CM

## 2015-02-14 DIAGNOSIS — Z0000 Encounter for general adult medical examination without abnormal findings: Secondary | ICD-10-CM

## 2015-02-14 DIAGNOSIS — Z111 Encounter for screening for respiratory tuberculosis: Secondary | ICD-10-CM

## 2015-02-14 DIAGNOSIS — K219 Gastro-esophageal reflux disease without esophagitis: Secondary | ICD-10-CM | POA: Insufficient documentation

## 2015-02-14 DIAGNOSIS — Z0184 Encounter for antibody response examination: Secondary | ICD-10-CM

## 2015-02-14 NOTE — Patient Instructions (Signed)

## 2015-02-14 NOTE — Progress Notes (Signed)
Subjective:    Patient ID: [name], female    DOB: 07-01-1986, 29 y.o.   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   Gastrophageal Reflux She reports no belching, no choking, no coughing, no heartburn or no sore throat. This is a chronic problem. The current episode started more than 1 year ago. The problem occurs rarely. The problem has been resolved. The symptoms are aggravated by certain foods. Pertinent negatives include no fatigue. She has tried a PPI for the symptoms. The treatment provided moderate relief.     Review of Systems  Constitutional: Negative.  Negative for fatigue.  HENT: Negative.  Negative for sore throat.   Eyes: Negative.   Respiratory: Negative.  Negative for cough, choking and shortness of breath.   Cardiovascular: Negative.  Negative for palpitations.  Gastrointestinal: Negative.  Negative for heartburn.  Endocrine: Negative.   Genitourinary: Negative.   Musculoskeletal: Positive for back pain.  Neurological: Negative.  Negative for headaches.  Hematological: Negative.   Psychiatric/Behavioral: Negative.   All other systems reviewed and are negative.      Objective:   Physical Exam  Constitutional: She is oriented to person, place, and time. She appears well-developed and well-nourished. No distress.  HENT:  Head: Normocephalic and atraumatic.  Right Ear: External ear normal.  Left Ear: External ear normal.  Nose: Nose normal.  Mouth/Throat: Oropharynx is clear and moist.  Eyes: Pupils are equal, round, and reactive to light.  Neck: Normal range of motion. Neck supple. No thyromegaly present.  Goiter present   Cardiovascular: Normal rate, regular rhythm, normal heart sounds and intact distal pulses.   No murmur heard. Pulmonary/Chest: Effort normal and breath sounds normal. No respiratory distress. She has no wheezes.    Abdominal: Soft. Bowel sounds are normal. She exhibits no distension. There is no tenderness.  Musculoskeletal: Normal range of motion. She exhibits no edema or tenderness.  Neurological: She is alert and oriented to person, place, and time. She has normal reflexes. No cranial nerve deficit.  Skin: Skin is warm and dry.  Psychiatric: She has a normal mood and affect. Her behavior is normal. Judgment and thought content normal.  Vitals reviewed.   BP 125/86 mmHg  Pulse 86  Temp(Src) 97.6 F (36.4 C) (Oral)  Ht 5' 2" (1.575 m)  Wt 154 lb 6.4 oz (70.035 kg)  BMI 28.23 kg/m2       Assessment & Plan:  1. Asthma, chronic, mild persistent, uncomplicated  2. Encounter for TB tine test - PPD  3. Physical exam - Measles/Mumps/Rubella Immunity - Varicella zoster antibody, IgG - CMP14+EGFR  4. Immunity status testing - Measles/Mumps/Rubella Immunity - Varicella zoster antibody, IgG   Continue all meds Labs pending Health Maintenance reviewed Diet and exercise encouraged RTO as needed  [name], FNP

## 2015-02-15 LAB — CMP14+EGFR
ALBUMIN: 4.6 g/dL (ref 3.5–5.5)
ALT: 19 IU/L (ref 0–32)
AST: 12 IU/L (ref 0–40)
Albumin/Globulin Ratio: 2.2 (ref 1.1–2.5)
Alkaline Phosphatase: 64 IU/L (ref 39–117)
BUN/Creatinine Ratio: 7 — ABNORMAL LOW (ref 8–20)
BUN: 6 mg/dL (ref 6–20)
Bilirubin Total: 0.5 mg/dL (ref 0.0–1.2)
CALCIUM: 9.2 mg/dL (ref 8.7–10.2)
CO2: 25 mmol/L (ref 18–29)
CREATININE: 0.87 mg/dL (ref 0.57–1.00)
Chloride: 103 mmol/L (ref 97–108)
GFR calc Af Amer: 106 mL/min/{1.73_m2} (ref >59)
GFR calc non Af Amer: 92 mL/min/{1.73_m2} (ref >59)
Globulin, Total: 2.1 g/dL (ref 1.5–4.5)
Glucose: 100 mg/dL — ABNORMAL HIGH (ref 65–99)
POTASSIUM: 4.4 mmol/L (ref 3.5–5.2)
Sodium: 142 mmol/L (ref 134–144)
Total Protein: 6.7 g/dL (ref 6.0–8.5)

## 2015-02-15 LAB — MEASLES/MUMPS/RUBELLA IMMUNITY
MUMPS ABS, IGG: 209 AU/mL (ref >10.9)
RUBEOLA AB, IGG: <25 AU/mL — ABNORMAL LOW (ref >29.9)
Rubella Antibodies, IGG: 2.3 index (ref >0.99)

## 2015-02-15 LAB — VARICELLA ZOSTER ANTIBODY, IGG: VARICELLA: 1278 {index} (ref >165)

## 2015-02-17 ENCOUNTER — Ambulatory Visit: Payer: BLUE CROSS/BLUE SHIELD | Admitting: *Deleted

## 2015-02-17 DIAGNOSIS — Z111 Encounter for screening for respiratory tuberculosis: Secondary | ICD-10-CM

## 2015-02-17 LAB — TB SKIN TEST
Induration: 0 mm
TB Skin Test: NEGATIVE

## 2015-02-17 NOTE — Progress Notes (Signed)
Pt came in today to have PPD read. PPD is negative. 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 Pt is scheduled to RTO for 2nd PPD 5/31 and then to come back 6/2 to read PPD and get 1st MMR since Rubeola titer was non-immune.

## 2015-02-28 ENCOUNTER — Ambulatory Visit (INDEPENDENT_AMBULATORY_CARE_PROVIDER_SITE_OTHER): Payer: BLUE CROSS/BLUE SHIELD | Admitting: Nurse Practitioner

## 2015-02-28 ENCOUNTER — Ambulatory Visit (INDEPENDENT_AMBULATORY_CARE_PROVIDER_SITE_OTHER): Payer: BLUE CROSS/BLUE SHIELD | Admitting: *Deleted

## 2015-02-28 ENCOUNTER — Encounter: Payer: Self-pay | Admitting: Nurse Practitioner

## 2015-02-28 VITALS — BP 130/91 | HR 80 | Temp 97.3°F | Ht 62.0 in | Wt 154.8 lb

## 2015-02-28 DIAGNOSIS — Z111 Encounter for screening for respiratory tuberculosis: Secondary | ICD-10-CM

## 2015-02-28 DIAGNOSIS — H8303 Labyrinthitis, bilateral: Secondary | ICD-10-CM | POA: Diagnosis not present

## 2015-02-28 MED ORDER — MECLIZINE HCL 25 MG PO TABS
25.0000 mg | ORAL_TABLET | Freq: Three times a day (TID) | ORAL | Status: DC | PRN
Start: 2015-02-28 — End: 2016-04-12

## 2015-02-28 MED ORDER — FLUTICASONE PROPIONATE 50 MCG/ACT NA SUSP: 2.0000 | Freq: Every day | NASAL | Status: DC

## 2015-02-28 NOTE — Progress Notes (Signed)
PPD screening given and tolerated well.

## 2015-02-28 NOTE — Patient Instructions (Signed)
Labyrinthitis (Inner Ear Inflammation) Your exam shows you have an inner ear disturbance or labyrinthitis. The cause of this condition is not known. But it may be due to a virus infection. The symptoms of labyrinthitis include vertigo or dizziness made worse by motion, nausea and vomiting. The onset of labyrinthitis may be very sudden. It usually lasts for a few days and then clears up over 1-2 weeks. The treatment of an inner ear disturbance includes bed rest and medications to reduce dizziness, nausea, and vomiting. You should stay away from alcohol, tranquilizers, caffeine, nicotine, or any medicine your doctor thinks may make your symptoms worse. Further testing may be needed to evaluate your hearing and balance system. Please see your doctor or go to the emergency room right away if you have:  Increasing vertigo, earache, loss of hearing, or ear drainage.  Headache, blurred vision, trouble walking, fainting, or fever.  Persistent vomiting, dehydration, or extreme weakness. Document Released: 09/16/2005 Document Revised: 12/09/2011 Document Reviewed: 03/04/2007 [facility] Patient Information 2015 [place], [place]. This information is not intended to replace advice given to you by your health care provider. Make sure you discuss any questions you have with your health care provider.

## 2015-02-28 NOTE — Patient Instructions (Signed)

## 2015-02-28 NOTE — Progress Notes (Signed)
   Subjective:    Patient ID: [name], female    DOB: 02-12-1986, 29 y.o.   MRN: [id number]  HPI Patient in c/o intermittent ear pain with dizziness and motion sickness- occurs intermittently. Occurs worse when she has been on their boat and gets off of it. Says that she has never had before- Bending over to pick up something she gets dizzy. This all started 2-3 weeks ago and seems to have gotten worse.    Review of Systems  Constitutional: Negative.   HENT: Negative.   Respiratory: Negative.   Cardiovascular: Negative.   Gastrointestinal: Negative.   Genitourinary: Negative.   Neurological: Positive for dizziness and light-headedness. Negative for seizures and numbness.  Hematological: Negative.   Psychiatric/Behavioral: Negative.   All other systems reviewed and are negative.      Objective:   Physical Exam  Constitutional: She is oriented to person, place, and time. She appears well-developed and well-nourished.  Cardiovascular: Normal rate, regular rhythm and normal heart sounds.   Pulmonary/Chest: Effort normal and breath sounds normal.  Abdominal: Soft.  Neurological: She is alert and oriented to person, place, and time. No cranial nerve deficit.  Skin: Skin is warm.  Psychiatric: She has a normal mood and affect. Her behavior is normal. Judgment and thought content normal.   BP 130/91 mmHg  Pulse 80  Temp(Src) 97.3 F (36.3 C) (Oral)  Ht 5\' 2"  (1.575 m)  Wt 154 lb 12.8 oz (70.217 kg)  BMI 28.31 kg/m2        Assessment & Plan:  1. Labyrinthitis of both ears Force fluids  Rest RTO prn - meclizine (ANTIVERT) 25 MG tablet; Take 1 tablet (25 mg total) by mouth 3 (three) times daily as needed for dizziness.  Dispense: 30 tablet; Refill: 0 - fluticasone (FLONASE) 50 MCG/ACT nasal spray; Place 2 sprays into both nostrils daily.  Dispense: 16 g; Refill: [place], FNP

## 2015-03-02 ENCOUNTER — Ambulatory Visit (INDEPENDENT_AMBULATORY_CARE_PROVIDER_SITE_OTHER): Payer: BLUE CROSS/BLUE SHIELD | Admitting: *Deleted

## 2015-03-02 DIAGNOSIS — Z23 Encounter for immunization: Secondary | ICD-10-CM

## 2015-03-02 LAB — TB SKIN TEST
Induration: 0 mm
TB SKIN TEST: NEGATIVE

## 2015-03-07 ENCOUNTER — Telehealth: Payer: Self-pay | Admitting: *Deleted

## 2015-03-07 MED ORDER — PHENTERMINE HCL 15 MG PO CAPS: 15.0000 mg | ORAL_CAPSULE | ORAL | Status: DC

## 2015-03-07 NOTE — Telephone Encounter (Signed)
Patient aware that rx is up front to be picked up. 

## 2015-03-07 NOTE — Telephone Encounter (Signed)
RX ready for pick up 

## 2015-03-07 NOTE — Telephone Encounter (Signed)
Patient was just seen on 02/14/2015 and you repeated her kidney functions and also have a new weight does she have to be seen again to get refill on phentermine? If not she also wants to know if you can lower dose to 15mg .

## 2015-03-22 ENCOUNTER — Telehealth: Payer: Self-pay | Admitting: Family

## 2015-03-22 ENCOUNTER — Ambulatory Visit: Payer: BLUE CROSS/BLUE SHIELD | Admitting: Family

## 2015-03-22 NOTE — Telephone Encounter (Signed)
Patient states she does not need a refill on phenteramine currently.  Advised her to call back to schedule follow up when she needs a refill.  Patient agreeable.

## 2015-03-22 NOTE — Telephone Encounter (Signed)
She needs to be seen before she can have a refill on her phentermine rx

## 2015-03-22 NOTE — Telephone Encounter (Signed)
When would you like to see patient again for a follow up on her weight management?

## 2015-05-03 ENCOUNTER — Telehealth: Payer: Self-pay | Admitting: Family

## 2015-05-03 ENCOUNTER — Ambulatory Visit: Payer: BLUE CROSS/BLUE SHIELD

## 2015-05-16 ENCOUNTER — Ambulatory Visit (INDEPENDENT_AMBULATORY_CARE_PROVIDER_SITE_OTHER): Payer: BLUE CROSS/BLUE SHIELD | Admitting: *Deleted

## 2015-05-16 DIAGNOSIS — Z23 Encounter for immunization: Secondary | ICD-10-CM

## 2015-05-16 NOTE — Progress Notes (Signed)
Pt came in today to get her 2nd MMR vaccine. MMR vaccine given SubQ left upper arm and tolerated well, updated immunization record printed from [place] and given to pt.

## 2015-05-16 NOTE — Patient Instructions (Signed)
MMR Vaccine (Measles, Mumps and Rubella): What You Need to Know 1. Why get vaccinated? Measles, mumps, and rubella are serious diseases. Before vaccines they were very common, especially among children. Measles  Measles virus causes rash, cough, runny nose, eye irritation, and fever.  It can lead to ear infection, pneumonia, seizures (jerking and staring), brain damage, and death. Mumps  Mumps virus causes fever, headache, muscle pain, loss of appetite, and swollen glands.  It can lead to deafness, meningitis (infection of the brain and spinal cord covering), painful swelling of the testicles or ovaries, and rarely sterility. Rubella (German Measles)  Rubella virus causes rash, arthritis (mostly in women), and mild fever.  If a woman gets rubella while she is pregnant, she could have a miscarriage or her baby could be born with serious birth defects. These diseases spread from person to person through the air. You can easily catch them by being around someone who is already infected. Measles, mumps, and rubella (MMR) vaccine can protect children (and adults) from all three of these diseases. Thanks to successful vaccination programs these diseases are much less common in the [place] than they used to be. But if we stopped vaccinating they would return.  2. Who should get MMR vaccine and when? Children should get 2 doses of MMR vaccine:  First Dose: 12-15 months of age  Second Dose: 4-6 years of age (may be given earlier, if at least 28 days after the 1st dose) Some infants younger than 12 months should get a dose of MMR if they are traveling out of the country. (This dose will not count toward their routine series.) Some adults should also get MMR vaccine: Generally, anyone 18 years of age or older who was born after 1955 should get at least one dose of MMR vaccine, unless they can show that they have either been vaccinated or had all three diseases. MMR vaccine may be given at the same  time as other vaccines. Children between 1 and 12 years of age can get a "combination" vaccine called MMRV, which contains both MMR and varicella (chickenpox) vaccines. There is a separate Vaccine Information Statement for MMRV. 3. Some people should not get MMR vaccine or should wait.  Anyone who has ever had a life-threatening allergic reaction to the antibiotic neomycin, or any other component of MMR vaccine, should not get the vaccine. Tell your doctor if you have any severe allergies.  Anyone who had a life-threatening allergic reaction to a previous dose of MMR or MMRV vaccine should not get another dose.  Some people who are sick at the time the shot is scheduled may be advised to wait until they recover before getting MMR vaccine.  Pregnant women should not get MMR vaccine. Pregnant women who need the vaccine should wait until after giving birth. Women should avoid getting pregnant for 4 weeks after vaccination with MMR vaccine.  Tell your doctor if the person getting the vaccine:  Has HIV/AIDS, or another disease that affects the immune system  Is being treated with drugs that affect the immune system, such as steroids  Has any kind of cancer  Is being treated for cancer with radiation or drugs  Has ever had a low platelet count (a blood disorder)  Has gotten another vaccine within the past 4 weeks  Has recently had a transfusion or received other blood products Any of these might be a reason to not get the vaccine, or delay vaccination until later. 4. What are the risks   from MMR vaccine? A vaccine, like any medicine, is capable of causing serious problems, such as severe allergic reactions.  The risk of MMR vaccine causing serious harm, or death, is extremely small. Getting MMR vaccine is much safer than getting measles, mumps or rubella. Most people who get MMR vaccine do not have any serious problems with it. Mild problems  Fever (up to 1 person out of 6)  Mild rash  (about 1 person out of 20)  Swelling of glands in the cheeks or neck (about 1 person out of 75) If these problems occur, it is usually within 6-14 days after the shot. They occur less often after the second dose. Moderate problems  Seizure (jerking or staring) caused by fever (about 1 out of 3,000 doses)  Temporary pain and stiffness in the joints, mostly in teenage or adult women (up to 1 out of 4)  Temporary low platelet count, which can cause a bleeding disorder (about 1 out of 30,000 doses) Severe problems (very rare)  Serious allergic reaction (less than 1 out of a million doses)  Several other severe problems have been reported after a child gets MMR vaccine, including:  Deafness  Long-term seizures, coma, or lowered consciousness  Permanent brain damage These are so rare that it is hard to tell whether they are caused by the vaccine.  5. What if there is a serious reaction? What should I look for?  Look for anything that concerns you, such as signs of a severe allergic reaction, very high fever, or behavior changes. Signs of a severe allergic reaction can include hives, swelling of the face and throat, difficulty breathing, a fast heartbeat, dizziness, and weakness. These would start a few minutes to a few hours after the vaccination.  What should I do?  If you think it is a severe allergic reaction or other emergency that can't wait, call 9-1-1 or get the person to the nearest hospital. Otherwise, call your doctor.  Afterward, the reaction should be reported to the Vaccine Adverse Event Reporting System (VAERS). Your doctor might file this report, or you can do it yourself through the VAERS web site at www.vaers.SamedayNews.es, or by calling 818-392-0158. VAERS is only for reporting reactions. They do not give medical advice. 6. The National Vaccine Injury Compensation Program The [employer] Vaccine Injury Compensation Program (VICP) is a federal program that was created to  compensate people who may have been injured by certain vaccines. Persons who believe they may have been injured by a vaccine can learn about the program and about filing a claim by calling 5143565717 or visiting the [place] website at GoldCloset.com.ee. 7. How can I learn more?  Ask your doctor.  Call your local or state health department.  Contact the Centers for Disease Control and Prevention (CDC):  Call 712-680-1718 (1-800-CDC-INFO)  or  Visit CDC's website at http://hunter.com/ CDC Measles, Mumps, and Rubella (MMR) Interim VIS (01/18/11) Document Released: 07/14/2006 Document Revised: 01/31/2014 Document Reviewed: 10/28/2013 [employer] Patient Information 2015 [place], [place]. This information is not intended to replace advice given to you by your health care provider. Make sure you discuss any questions you have with your health care provider.

## 2015-06-14 ENCOUNTER — Encounter: Payer: Self-pay | Admitting: Pediatrics

## 2015-06-14 ENCOUNTER — Ambulatory Visit (INDEPENDENT_AMBULATORY_CARE_PROVIDER_SITE_OTHER): Payer: BLUE CROSS/BLUE SHIELD | Admitting: Pediatrics

## 2015-06-14 VITALS — BP 128/86 | HR 84 | Temp 97.4°F | Ht 62.0 in | Wt 162.0 lb

## 2015-06-14 DIAGNOSIS — J309 Allergic rhinitis, unspecified: Secondary | ICD-10-CM

## 2015-06-14 DIAGNOSIS — K2970 Gastritis, unspecified, without bleeding: Secondary | ICD-10-CM

## 2015-06-14 DIAGNOSIS — J4530 Mild persistent asthma, uncomplicated: Secondary | ICD-10-CM | POA: Diagnosis not present

## 2015-06-14 DIAGNOSIS — F411 Generalized anxiety disorder: Secondary | ICD-10-CM | POA: Diagnosis not present

## 2015-06-14 MED ORDER — FLUTICASONE PROPIONATE 50 MCG/ACT NA SUSP: 2.0000 | Freq: Every day | NASAL | Status: DC

## 2015-06-14 MED ORDER — MONTELUKAST SODIUM 10 MG PO TABS: 10.0000 mg | ORAL_TABLET | Freq: Every day | ORAL | Status: DC

## 2015-06-14 MED ORDER — OMEPRAZOLE 20 MG PO CPDR: 20.0000 mg | DELAYED_RELEASE_CAPSULE | Freq: Two times a day (BID) | ORAL | Status: DC

## 2015-06-14 MED ORDER — ESCITALOPRAM OXALATE 10 MG PO TABS: 10.0000 mg | ORAL_TABLET | Freq: Every day | ORAL | Status: DC

## 2015-06-14 NOTE — Patient Instructions (Signed)
Take half a tab for 6 days, then take a full tab of lexapro 10 mg.

## 2015-06-14 NOTE — Progress Notes (Signed)
Subjective:    Patient ID: [name], female    DOB: 05/08/1986, 29 y.o.   MRN: [id number]  HPI: [name] is a 29 y.o. female presenting on 06/14/2015 for Anxiety  Having anxiety trouble Started after stopping taking the phentermine apprx 6 weeks ago. 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 Struggle to read and comprehend the questions.   Took lexapro for 6-9 months 3-4 years ago. She did notice a difference while on it. Wasn't having focus problems then, just easily irritated.   Had breast augmentation surg Apr 15, 2015.   Having tension headaches, start in back of neck, going up back of neck. Not taken any phentermine for past 3 weeks.  Drinking 2-3 bottles of mountain dew in a day. No heart palpitations, no fevers, otherwise has been feeling well, no weakness, no vision trouble, no hearing changes. No abd pain. +runny nose, feels like constantly clearing throat.  GAD7 Score: Feeling nervous, anxious or on edge 3 Not being able to stop or control worrying 2 Worrying too much about different things 3 Trouble relaxing 3 Being so restless that it is hard to sit still 2 Becoming easily annoyed or irritable 3 Feeling afraid as if something awful might happen 3        Total Score 17  Depression screen [facility] 2/9 06/14/2015 02/28/2015 11/09/2014  Decreased Interest 0 0 -  Down, Depressed, Hopeless 0 0 0  PHQ - 2 Score 0 0 0      Relevant past medical, surgical, family and social history reviewed and updated as indicated. Interim medical history since our last visit reviewed. Allergies and medications reviewed and updated.   ROS: All systems negative other than what is in HPI.  Past Medical History Patient Active Problem List   Diagnosis Date Noted  . Allergic rhinitis 02/14/2015  . GERD (gastroesophageal reflux disease) 02/14/2015  . Asthma,  chronic 12/19/2014  . Simple ovarian cyst 01/19/2014    Current Outpatient Prescriptions  Medication Sig Dispense Refill  . albuterol (PROVENTIL HFA;VENTOLIN HFA) 108 (90 BASE) MCG/ACT inhaler Inhale 2 puffs into the lungs every 6 (six) hours as needed for wheezing. 1 Inhaler 0  . fluticasone (FLONASE) 50 MCG/ACT nasal spray Place 2 sprays into both nostrils daily. 16 g 6  . meclizine (ANTIVERT) 25 MG tablet Take 1 tablet (25 mg total) by mouth 3 (three) times daily as needed for dizziness. 30 tablet 0  . montelukast (SINGULAIR) 10 MG tablet Take 1 tablet (10 mg total) by mouth at bedtime. 30 tablet 3  . omeprazole (PRILOSEC) 20 MG capsule Take 1 capsule (20 mg total) by mouth 2 (two) times daily before a meal. 60 capsule 3  . escitalopram (LEXAPRO) 10 MG tablet Take 1 tablet (10 mg total) by mouth daily. 30 tablet 5  . fluticasone (FLONASE) 50 MCG/ACT nasal spray Place 2 sprays into both nostrils daily. 16 g 6   No current facility-administered medications for this visit.       Objective:    BP 128/86 mmHg  Pulse 84  Temp(Src) 97.4 F (36.3 C) (Oral)  Ht 5\' 2"  (1.575 m)  Wt 162 lb (73.483 kg)  BMI 29.62 kg/m2  Wt Readings from Last 3 Encounters:  06/14/15 162 lb (73.483 kg)  02/28/15 154 lb 12.8 oz (70.217 kg)  02/14/15 154 lb 6.4 oz (70.035 kg)  Gen: NAD, alert, cooperative with exam, NCAT EYES: EOMI, no scleral injection or icterus ENT:  TMs pearly gray b/l, OP without erythema LYMPH: no cervical LAD CV: NRRR, normal S1/S2, no murmur, DP pulses 2+ b/l Resp: CTABL, no wheezes, normal WOB Neuro: strength equal b/l UE and LE, coordination grossly normal MSK: normal muscle bulk PSYCH: +anxiety, no pressured speech, alert and oriented. No SI/HI, thoughts of hurting herself.     Assessment & Plan:    [name] was seen today for anxiety.  Diagnoses and all orders for this visit:  Generalized anxiety disorder Start half a tab for 6 days, increase to full tab. Call if  no better in 4 weeks, will increase to 20 mg. Take frequent breaks as needed, exercise every day. Decrease overall caffeine intake. -     escitalopram (LEXAPRO) 10 MG tablet; Take 1 tablet (10 mg total) by mouth daily.  Asthma, chronic, mild persistent, uncomplicated -     montelukast (SINGULAIR) 10 MG tablet; Take 1 tablet (10 mg total) by mouth at bedtime.  Gastritis Symptoms well controlled with PPI, come back when she stops it. Continue omeprazole. -     omeprazole (PRILOSEC) 20 MG capsule; Take 1 capsule (20 mg total) by mouth 2 (two) times daily before a meal.  Allergic rhinitis, unspecified allergic rhinitis type -     fluticasone (FLONASE) 50 MCG/ACT nasal spray; Place 2 sprays into both nostrils daily.    Follow up plan: Return in about 4 weeks (around 07/12/2015).  Assunta Found, MD [facility] 06/14/2015, 6:04 PM

## 2015-07-18 ENCOUNTER — Ambulatory Visit: Payer: BLUE CROSS/BLUE SHIELD | Admitting: Nurse Practitioner

## 2015-07-19 ENCOUNTER — Encounter: Payer: Self-pay | Admitting: Pediatrics

## 2015-07-19 ENCOUNTER — Ambulatory Visit (INDEPENDENT_AMBULATORY_CARE_PROVIDER_SITE_OTHER): Payer: BLUE CROSS/BLUE SHIELD | Admitting: Pediatrics

## 2015-07-19 VITALS — BP 120/85 | HR 67 | Temp 97.6°F | Ht 62.0 in | Wt 164.4 lb

## 2015-07-19 DIAGNOSIS — N393 Stress incontinence (female) (male): Secondary | ICD-10-CM | POA: Diagnosis not present

## 2015-07-19 DIAGNOSIS — F411 Generalized anxiety disorder: Secondary | ICD-10-CM | POA: Diagnosis not present

## 2015-07-19 LAB — POCT URINALYSIS DIPSTICK
BILIRUBIN UA: NEGATIVE
Blood, UA: NEGATIVE
GLUCOSE UA: NEGATIVE
KETONES UA: NEGATIVE
LEUKOCYTES UA: NEGATIVE
Nitrite, UA: NEGATIVE
Protein, UA: NEGATIVE
Spec Grav, UA: 1.015
Urobilinogen, UA: NEGATIVE
pH, UA: 6

## 2015-07-19 NOTE — Progress Notes (Signed)
    Subjective:    Patient ID: [name], female    DOB: [birth year], 29 y.o.   MRN: [id number]  CC: f/u anxiety and incontinence  HPI: [name] is a 29 y.o. female presenting on 07/19/2015 for Anxiety  Anxiety is better than it was on the [place] well Still drinking lots of caffeine Irritability is better  Some urinary incontinence with running, jumping, sports, active activities, running and suddenly Also with coughing, sneezing, laughing Progressively has gotten worse with each child. Feels like she is eptying bladder   Relevant past medical, surgical, family and social history reviewed and updated as indicated. Interim medical history since our last visit reviewed. Allergies and medications reviewed and updated.   ROS: Per HPI unless specifically indicated above  Past Medical History Patient Active Problem List   Diagnosis Date Noted  . Generalized anxiety disorder 07/19/2015  . Stress incontinence 07/19/2015  . Allergic rhinitis 02/14/2015  . GERD (gastroesophageal reflux disease) 02/14/2015  . Asthma, chronic 12/19/2014  . Simple ovarian cyst 01/19/2014    Current Outpatient Prescriptions  Medication Sig Dispense Refill  . albuterol (PROVENTIL HFA;VENTOLIN HFA) 108 (90 BASE) MCG/ACT inhaler Inhale 2 puffs into the lungs every 6 (six) hours as needed for wheezing. 1 Inhaler 0  . escitalopram (LEXAPRO) 10 MG tablet Take 1 tablet (10 mg total) by mouth daily. 30 tablet 5  . fluticasone (FLONASE) 50 MCG/ACT nasal spray Place 2 sprays into both nostrils daily. 16 g 6  . meclizine (ANTIVERT) 25 MG tablet Take 1 tablet (25 mg total) by mouth 3 (three) times daily as needed for dizziness. 30 tablet 0  . montelukast (SINGULAIR) 10 MG tablet Take 1 tablet (10 mg total) by mouth at bedtime. 30 tablet 3  . omeprazole (PRILOSEC) 20 MG capsule Take 1 capsule (20 mg total) by mouth 2 (two) times daily before a meal. 60 capsule 3   No current facility-administered  medications for this visit.       Objective:    BP 120/85 mmHg  Pulse 67  Temp(Src) 97.6 F (36.4 C) (Oral)  Ht 5\' 2"  (1.575 m)  Wt 164 lb 6 oz (74.56 kg)  BMI 30.06 kg/m2  Wt Readings from Last 3 Encounters:  07/19/15 164 lb 6 oz (74.56 kg)  06/14/15 162 lb (73.483 kg)  02/28/15 154 lb 12.8 oz (70.217 kg)    Gen: NAD, alert, cooperative with exam, NCAT EYES: EOMI, no scleral injection or icterus CV: NRRR, normal S1/S2, no murmur, distal pulses 2+ b/l Resp: CTABL, no wheezes, normal WOB Abd: +BS, soft, NTND. no guarding or organomegaly Neuro: Alert and oriented MSK: normal muscle bulk Psych: full affect, no SI/HI    Assessment & Plan:   [name] was seen today for anxiety and stress incontinence.  Diagnoses and all orders for this visit:  Generalized anxiety disorder Symptoms improved on lexapro, still present. Has not recently had thyroid checked. Increase lexapro to 20mg . If she is getting too sleepy will let me know. Check thyroid level.  -     Thyroid Panel With TSH  Stress incontinence Trial kegel exercises, referral to her gynecologist -     Ambulatory referral to Gynecology -     POCT urinalysis dipstick   Follow up plan: Return in about 8 weeks (around 09/13/2015), or if symptoms worsen or fail to improve.  Assunta Found, MD [facility] 07/19/2015, 3:14 PM

## 2015-07-19 NOTE — Patient Instructions (Signed)
lexapro to 20mg  a day

## 2015-07-20 LAB — THYROID PANEL WITH TSH
FREE THYROXINE INDEX: 2.1 (ref 1.2–4.9)
T3 UPTAKE RATIO: 26 % (ref 24–39)
T4, Total: 8 ug/dL (ref 4.5–12.0)
TSH: 1.6 u[IU]/mL (ref 0.450–4.500)

## 2015-08-11 ENCOUNTER — Telehealth: Payer: Self-pay | Admitting: Nurse Practitioner

## 2015-08-11 DIAGNOSIS — F411 Generalized anxiety disorder: Secondary | ICD-10-CM

## 2015-08-14 MED ORDER — ESCITALOPRAM OXALATE 10 MG PO TABS: 15.0000 mg | ORAL_TABLET | Freq: Every day | ORAL | Status: DC

## 2015-08-14 NOTE — Telephone Encounter (Signed)
Was taking 20mg  daily, felt like she was not having any emotions, felt blank. Now taking 15mg  of lexapro a day for the past two days, feeling much better back to normal self. Sent in 15mg  daily refill. Also has felt some sinus pressure for past day. Rec netipot BID, just started flonase, ibuprofen. If still sick next week come in to be seen.

## 2015-08-14 NOTE — Telephone Encounter (Signed)
Has not seen me- seeing dr. Evette Doffing will firward to her to address

## 2015-08-15 ENCOUNTER — Ambulatory Visit (INDEPENDENT_AMBULATORY_CARE_PROVIDER_SITE_OTHER): Payer: BLUE CROSS/BLUE SHIELD | Admitting: Family

## 2015-08-15 ENCOUNTER — Encounter: Payer: Self-pay | Admitting: Family

## 2015-08-15 VITALS — BP 128/86 | HR 92 | Temp 97.7°F | Ht 62.0 in | Wt 169.2 lb

## 2015-08-15 DIAGNOSIS — J0100 Acute maxillary sinusitis, unspecified: Secondary | ICD-10-CM | POA: Diagnosis not present

## 2015-08-15 MED ORDER — AMOXICILLIN-POT CLAVULANATE 875-125 MG PO TABS: 1.0000 | ORAL_TABLET | Freq: Two times a day (BID) | ORAL | Status: DC

## 2015-08-15 NOTE — Progress Notes (Signed)
Subjective:    Patient ID: [name], female    DOB: July 13, 1986, 29 y.o.   MRN: [id number]  Sinusitis This is a new problem. The current episode started in the past 7 days. The problem has been gradually worsening since onset. There has been no fever. Her pain is at a severity of 5/10. The pain is moderate. Associated symptoms include congestion, coughing, ear pain, headaches, a hoarse voice, shortness of breath, sinus pressure, sneezing, a sore throat and swollen glands. Pertinent negatives include no chills. Past treatments include oral decongestants and saline sprays. The treatment provided mild relief.  Cough Associated symptoms include ear pain, headaches, a sore throat and shortness of breath. Pertinent negatives include no chills.      Review of Systems  Constitutional: Negative.  Negative for chills.  HENT: Positive for congestion, ear pain, hoarse voice, sinus pressure, sneezing and sore throat.   Eyes: Negative.   Respiratory: Positive for cough and shortness of breath.   Cardiovascular: Negative.  Negative for palpitations.  Gastrointestinal: Negative.   Endocrine: Negative.   Genitourinary: Negative.   Musculoskeletal: Negative.   Neurological: Positive for headaches.  Hematological: Negative.   Psychiatric/Behavioral: Negative.   All other systems reviewed and are negative.      Objective:   Physical Exam  Constitutional: She is oriented to person, place, and time. She appears well-developed and well-nourished. No distress.  HENT:  Head: Normocephalic and atraumatic.  Right Ear: External ear normal.  Left Ear: External ear normal.  Nose: Right sinus exhibits maxillary sinus tenderness. Left sinus exhibits maxillary sinus tenderness.  Nasal passage erythemas with moderate swelling  Oropharynx erythemas  Eyes: Pupils are equal, round, and reactive to light.  Neck: Normal range of motion. Neck supple. No thyromegaly present.  Cardiovascular: Normal rate,  regular rhythm, normal heart sounds and intact distal pulses.   No murmur heard. Pulmonary/Chest: Effort normal and breath sounds normal. No respiratory distress. She has no wheezes.  Abdominal: Soft. Bowel sounds are normal. She exhibits no distension. There is no tenderness.  Musculoskeletal: Normal range of motion. She exhibits no edema or tenderness.  Neurological: She is alert and oriented to person, place, and time. She has normal reflexes. No cranial nerve deficit.  Skin: Skin is warm and dry.  Psychiatric: She has a normal mood and affect. Her behavior is normal. Judgment and thought content normal.  Vitals reviewed.     BP 128/86 mmHg  Pulse 92  Temp(Src) 97.7 F (36.5 C) (Oral)  Ht 5\' 2"  (1.575 m)  Wt 169 lb 3.2 oz (76.749 kg)  BMI 30.94 kg/m2     Assessment & Plan:  1. Acute maxillary sinusitis, recurrence not specified -- Take meds as prescribed - Use a cool mist humidifier  -Use saline nose sprays frequently -Saline irrigations of the nose can be very helpful if done frequently.  * 4X daily for 1 week*  * Use of a nettie pot can be helpful with this. Follow directions with this* -Force fluids -For any cough or congestion  Use plain Mucinex- regular strength or max strength is fine   * Children- consult with Pharmacist for dosing -For fever or aces or pains- take tylenol or ibuprofen appropriate for age and weight.  * for fevers greater than 101 orally you may alternate ibuprofen and tylenol every  3 hours. -Throat lozenges if help - amoxicillin-clavulanate (AUGMENTIN) 875-125 MG tablet; Take 1 tablet by mouth 2 (two) times daily.  Dispense: 14 tablet; Refill: 0  Sylar Voong  Lenna Gilford, [place]

## 2015-08-15 NOTE — Patient Instructions (Signed)
Sinusitis, Adult Sinusitis is redness, soreness, and inflammation of the paranasal sinuses. Paranasal sinuses are air pockets within the bones of your face. They are located beneath your eyes, in the middle of your forehead, and above your eyes. In healthy paranasal sinuses, mucus is able to drain out, and air is able to circulate through them by way of your nose. However, when your paranasal sinuses are inflamed, mucus and air can become trapped. This can allow bacteria and other germs to grow and cause infection. Sinusitis can develop quickly and last only a short time (acute) or continue over a long period (chronic). Sinusitis that lasts for more than 12 weeks is considered chronic. CAUSES Causes of sinusitis include:  Allergies.  Structural abnormalities, such as displacement of the cartilage that separates your nostrils (deviated septum), which can decrease the air flow through your nose and sinuses and affect sinus drainage.  Functional abnormalities, such as when the small hairs (cilia) that line your sinuses and help remove mucus do not work properly or are not present. SIGNS AND SYMPTOMS Symptoms of acute and chronic sinusitis are the same. The primary symptoms are pain and pressure around the affected sinuses. Other symptoms include:  Upper toothache.  Earache.  Headache.  Bad breath.  Decreased sense of smell and taste.  A cough, which worsens when you are lying flat.  Fatigue.  Fever.  Thick drainage from your nose, which often is green and may contain pus (purulent).  Swelling and warmth over the affected sinuses. DIAGNOSIS Your health care provider will perform a physical exam. During your exam, your health care provider may perform any of the following to help determine if you have acute sinusitis or chronic sinusitis:  Look in your nose for signs of abnormal growths in your nostrils (nasal polyps).  Tap over the affected sinus to check for signs of  infection.  View the inside of your sinuses using an imaging device that has a light attached (endoscope). If your health care provider suspects that you have chronic sinusitis, one or more of the following tests may be recommended:  Allergy tests.  Nasal culture. A sample of mucus is taken from your nose, sent to a lab, and screened for bacteria.  Nasal cytology. A sample of mucus is taken from your nose and examined by your health care provider to determine if your sinusitis is related to an allergy. TREATMENT Most cases of acute sinusitis are related to a viral infection and will resolve on their own within 10 days. Sometimes, medicines are prescribed to help relieve symptoms of both acute and chronic sinusitis. These may include pain medicines, decongestants, nasal steroid sprays, or saline sprays. However, for sinusitis related to a bacterial infection, your health care provider will prescribe antibiotic medicines. These are medicines that will help kill the bacteria causing the infection. Rarely, sinusitis is caused by a fungal infection. In these cases, your health care provider will prescribe antifungal medicine. For some cases of chronic sinusitis, surgery is needed. Generally, these are cases in which sinusitis recurs more than 3 times per year, despite other treatments. HOME CARE INSTRUCTIONS  Drink plenty of water. Water helps thin the mucus so your sinuses can drain more easily.  Use a humidifier.  Inhale steam 3-4 times a day (for example, sit in the bathroom with the shower running).  Apply a warm, moist washcloth to your face 3-4 times a day, or as directed by your health care provider.  Use saline nasal sprays to help   moisten and clean your sinuses.  Take medicines only as directed by your health care provider.  If you were prescribed either an antibiotic or antifungal medicine, finish it all even if you start to feel better. SEEK IMMEDIATE MEDICAL CARE IF:  You have  increasing pain or severe headaches.  You have nausea, vomiting, or drowsiness.  You have swelling around your face.  You have vision problems.  You have a stiff neck.  You have difficulty breathing.   This information is not intended to replace advice given to you by your health care provider. Make sure you discuss any questions you have with your health care provider.   Document Released: 09/16/2005 Document Revised: 10/07/2014 Document Reviewed: 10/01/2011 Elsevier Interactive Patient Education 2016 [employer]  - Take meds as prescribed - Use a cool mist humidifier  -Use saline nose sprays frequently -Saline irrigations of the nose can be very helpful if done frequently.  * 4X daily for 1 week*  * Use of a nettie pot can be helpful with this. Follow directions with this* -Force fluids -For any cough or congestion  Use plain Mucinex- regular strength or max strength is fine   * Children- consult with Pharmacist for dosing -For fever or aces or pains- take tylenol or ibuprofen appropriate for age and weight.  * for fevers greater than 101 orally you may alternate ibuprofen and tylenol every  3 hours. -Throat lozenges if help   Baani Bober, FNP   

## 2015-09-27 NOTE — Progress Notes (Unsigned)
Glucose: Non-Fasting: 99 taken by Samule Dry RN

## 2015-11-03 ENCOUNTER — Encounter: Payer: Self-pay | Admitting: Pediatrics

## 2015-11-03 ENCOUNTER — Ambulatory Visit (INDEPENDENT_AMBULATORY_CARE_PROVIDER_SITE_OTHER): Payer: BLUE CROSS/BLUE SHIELD | Admitting: Pediatrics

## 2015-11-03 VITALS — BP 111/77 | HR 84 | Temp 97.0°F | Ht 62.0 in | Wt 177.8 lb

## 2015-11-03 DIAGNOSIS — R062 Wheezing: Secondary | ICD-10-CM

## 2015-11-03 DIAGNOSIS — F411 Generalized anxiety disorder: Secondary | ICD-10-CM | POA: Diagnosis not present

## 2015-11-03 DIAGNOSIS — J4530 Mild persistent asthma, uncomplicated: Secondary | ICD-10-CM

## 2015-11-03 DIAGNOSIS — K219 Gastro-esophageal reflux disease without esophagitis: Secondary | ICD-10-CM

## 2015-11-03 MED ORDER — ALBUTEROL SULFATE HFA 108 (90 BASE) MCG/ACT IN AERS: 2.0000 | INHALATION_SPRAY | Freq: Four times a day (QID) | RESPIRATORY_TRACT | Status: DC | PRN

## 2015-11-03 MED ORDER — FLUOXETINE HCL 20 MG PO TABS: 20.0000 mg | ORAL_TABLET | Freq: Every day | ORAL | Status: DC

## 2015-11-03 MED ORDER — SPACER/AERO CHAMBER MOUTHPIECE MISC: Status: DC

## 2015-11-03 NOTE — Progress Notes (Signed)
Subjective:    Patient ID: [name], female    DOB: 1986-04-08, 30 y.o.   MRN: [id number]  CC: Anxiety and Shortness of Breath   HPI: [name] is a 30 y.o. female presenting for Anxiety and Shortness of Breath  Was taking increased dose of lexapro 15mg , then started being really sleepy on it, went down to 10mg  daily and got irritable  Can be in middle of conversation, gets SOB, not like a panic attack, sits down  H/o asthma, has been on albuteorl in the past.  GER: has been on PPI for years, was havign stomach pains while on lots of NSAIDs, has since stopped the NSAIDs, pain much improved. Takes PPI at night.    Depression screen [facility] 2/9 11/03/2015 08/15/2015 06/14/2015 02/28/2015 11/09/2014  Decreased Interest 1 0 0 0 -  Down, Depressed, Hopeless 2 0 0 0 0  PHQ - 2 Score 3 0 0 0 0  Altered sleeping 0 - - - -  Tired, decreased energy 0 - - - -  Change in appetite 2 - - - -  Trouble concentrating 2 - - - -  Moving slowly or fidgety/restless 2 - - - -  Suicidal thoughts 0 - - - -  PHQ-9 Score 9 - - - -  Difficult doing work/chores Somewhat difficult - - - -     Relevant past medical, surgical, family and social history reviewed and updated as indicated. Interim medical history since our last visit reviewed. Allergies and medications reviewed and updated.    ROS: Per HPI unless specifically indicated above  History  Smoking status  . Never Smoker   Smokeless tobacco  . Never Used    Past Medical History Patient Active Problem List   Diagnosis Date Noted  . Generalized anxiety disorder 07/19/2015  . Stress incontinence 07/19/2015  . Allergic rhinitis 02/14/2015  . GERD (gastroesophageal reflux disease) 02/14/2015  . Asthma, chronic 12/19/2014  . Simple ovarian cyst 01/19/2014    Current Outpatient Prescriptions  Medication Sig Dispense Refill  . albuterol (PROVENTIL HFA;VENTOLIN HFA) 108 (90 Base) MCG/ACT inhaler Inhale 2 puffs into the lungs every 6  (six) hours as needed for wheezing. 1 Inhaler 0  . montelukast (SINGULAIR) 10 MG tablet Take 1 tablet (10 mg total) by mouth at bedtime. 30 tablet 3  . omeprazole (PRILOSEC) 20 MG capsule Take 1 capsule (20 mg total) by mouth 2 (two) times daily before a meal. (Patient taking differently: Take 20 mg by mouth daily. ) 60 capsule 3  . FLUoxetine (PROZAC) 20 MG tablet Take 1 tablet (20 mg total) by mouth daily. 30 tablet 3  . fluticasone (FLONASE) 50 MCG/ACT nasal spray Place 2 sprays into both nostrils daily. (Patient not taking: Reported on 11/03/2015) 16 g 6  . meclizine (ANTIVERT) 25 MG tablet Take 1 tablet (25 mg total) by mouth 3 (three) times daily as needed for dizziness. (Patient not taking: Reported on 11/03/2015) 30 tablet 0  . Spacer/Aero Chamber Mouthpiece MISC Please dispense one spacer for use with inhaler. 1 each 0   No current facility-administered medications for this visit.       Objective:    BP 111/77 mmHg  Pulse 84  Temp(Src) 97 F (36.1 C) (Oral)  Ht 5\' 2"  (1.575 m)  Wt 177 lb 12.8 oz (80.65 kg)  BMI 32.51 kg/m2  SpO2 99%  Wt Readings from Last 3 Encounters:  11/03/15 177 lb 12.8 oz (80.65 kg)  08/15/15 169 lb 3.2  oz (76.749 kg)  07/19/15 164 lb 6 oz (74.56 kg)     Gen: NAD, alert, cooperative with exam, NCAT EYES: EOMI, no scleral injection or icterus ENT:  TMs pearly gray b/l, OP without erythema LYMPH: no cervical LAD CV: NRRR, normal S1/S2, no murmur, distal pulses 2+ b/l Resp: moving air well, does have few scattered wheezes with exhalation. normal WOB Abd: +BS, soft, NTND. no guarding or organomegaly Ext: No edema, warm Neuro: Alert and oriented, strength equal b/l UE and LE, coordination grossly normal MSK: normal muscle bulk Psych: normal affect, mood is ok     Assessment & Plan:    [name] was seen today for anxiety and shortness of breath.  Diagnoses and all orders for this visit:  Wheezing -     albuterol (PROVENTIL HFA;VENTOLIN HFA) 108 (90  Base) MCG/ACT inhaler; Inhale 2 puffs into the lungs every 6 (six) hours as needed for wheezing. -     Spacer/Aero Chamber Mouthpiece MISC; Please dispense one spacer for use with inhaler.  Asthma, mild persistent, uncomplicated If using albuteorl more than a couple times a week for SOB, let me know, may need controller medicine.  Generalized anxiety disorder lexapro made her sleepy when on higher dose, will cross taper to below. -     FLUoxetine (PROZAC) 20 MG tablet; Take 1 tablet (20 mg total) by mouth daily.  Gastroesophageal reflux disease, esophagitis presence not specified Trial off of PPI. If stomach pains return can use famotidine. Let me know if not improving.   Follow up plan: Return in about 3 months (around 01/31/2016).  Assunta Found, MD [facility] 11/03/2015, 11:18 AM

## 2015-11-03 NOTE — Patient Instructions (Signed)
Fluoxetine: new med--take half a tab if tabs once a day for 8 days, then take full tab  Lexapro--take at night, half a tab for 4 days then stop.

## 2015-11-10 ENCOUNTER — Other Ambulatory Visit: Payer: Self-pay

## 2015-11-10 DIAGNOSIS — J4530 Mild persistent asthma, uncomplicated: Secondary | ICD-10-CM

## 2015-11-10 MED ORDER — MONTELUKAST SODIUM 10 MG PO TABS: 10.0000 mg | ORAL_TABLET | Freq: Every day | ORAL | Status: DC

## 2015-12-01 ENCOUNTER — Telehealth: Payer: Self-pay | Admitting: Pediatrics

## 2015-12-01 NOTE — Telephone Encounter (Signed)
Pt states she is having GI upset and increased anxiety after taking Prozac x 1 mth Please advise

## 2015-12-01 NOTE — Telephone Encounter (Signed)
SPoke with pt, will stay on prozac two more weeks, will then call, can either switch to lexapro or citalopram if symptoms not improved.

## 2016-02-01 ENCOUNTER — Ambulatory Visit: Payer: BLUE CROSS/BLUE SHIELD | Admitting: Family

## 2016-02-02 ENCOUNTER — Ambulatory Visit (INDEPENDENT_AMBULATORY_CARE_PROVIDER_SITE_OTHER): Payer: BLUE CROSS/BLUE SHIELD | Admitting: Family Medicine

## 2016-02-02 ENCOUNTER — Encounter: Payer: Self-pay | Admitting: Family Medicine

## 2016-02-02 VITALS — BP 126/84 | HR 82 | Temp 98.0°F | Ht 62.0 in | Wt 180.8 lb

## 2016-02-02 DIAGNOSIS — A6920 Lyme disease, unspecified: Secondary | ICD-10-CM

## 2016-02-02 MED ORDER — FLUCONAZOLE 150 MG PO TABS: 150.0000 mg | ORAL_TABLET | Freq: Once | ORAL | Status: DC

## 2016-02-02 MED ORDER — DOXYCYCLINE HYCLATE 100 MG PO TABS
100.0000 mg | ORAL_TABLET | Freq: Two times a day (BID) | ORAL | Status: DC
Start: 2016-02-02 — End: 2016-02-22

## 2016-02-02 NOTE — Progress Notes (Signed)
HPI  Patient presents today here mouth rash after tick bite.  Patient explains that on April 26, about 1-1/2 weeks ago, she removed a tick from her left chest. It was not attached at the time. She did not think anything else about it.  Over the last 3 days she's developed a rash on her left breast with a small nodule in the tic was found. She's also had fatigue, malaise, myalgias  Tolerating food and fluids normally.   PMH: Smoking status noted ROS: Per HPI  Objective: BP 126/84 mmHg  Pulse 82  Temp(Src) 98 F (36.7 C) (Oral)  Ht 5' 2" (1.575 m)  Wt 180 lb 12.8 oz (82.01 kg)  BMI 33.06 kg/m2 Gen: NAD, alert, cooperative with exam HEENT: NCAT CV: RRR, good S1/S2, no murmur Resp: CTABL, no wheezes, non-labored Ext: No edema, warm Neuro: Alert and oriented, No gross deficits  Skin: Left upper chest with small papule surrounded by erythema with central clearing and larger erythematous circle characteristic of target-like lesion of erythema migrans, measuring 6-1/2 cm in diameter and circular, no induration, drainage, or tenderness to palpation  Assessment and plan:  # Erythema migrans Possible local infection or local reaction to tick bite, however with central clearing and very characteristic rash think it's most prudent to go ahead and cover with doxycycline She has an IUD for contraception Labs ordered Discussed usual course of illness and reasons to return. Diflucan by request    Orders Placed This Encounter  Procedures  . Lyme Ab/Western Blot Reflex  . [place] spotted fvr ab, IgM-blood  . CBC with Differential  . CMP14+EGFR    Meds ordered this encounter  Medications  . doxycycline (VIBRA-TABS) 100 MG tablet    Sig: Take 1 tablet (100 mg total) by mouth 2 (two) times daily. 1 po bid    Dispense:  42 tablet    Refill:  0  . fluconazole (DIFLUCAN) 150 MG tablet    Sig: Take 1 tablet (150 mg total) by mouth once. Repeat once a week during treatment   Dispense:  3 tablet    Refill:  0    Sam Bradshaw, MD [facility] 02/02/2016, 10:04 AM      

## 2016-02-02 NOTE — Patient Instructions (Signed)
Great to meet you!  I am treating you for lyme disease, we are doing labs to see if they agree. They are not 100% accurate but your rash is very concerning  Lyme Disease Lyme disease is an infection that affects many parts of the body, including the skin, joints, and nervous system. CAUSES Lyme disease is caused by bacteria called Borrelia burgdorferi. You can get Lyme disease by being bitten by an infected tick. The tick must be attached to your skin for at least 36 hours to transmit the infection. Deer often carry infected ticks. RISK FACTORS  Living in or visiting [place] states, or the [place].  Spending time in wooded or grassy areas.  Being outdoors with exposed skin.  Failing to remove a tick from your skin within 3-4 days. SIGNS AND SYMPTOMS  A round, red rash that comes out from the center of the tick bite. This is the first sign of infection. The center of the rash may be blood colored or have tiny blisters.  Fatigue.  Headache.  Chills and fever.  General achiness.  Joint pain, often in the knee.  Swollen lymph glands. DIAGNOSIS Lyme disease is diagnosed with a medical history, physical exam, and blood test. TREATMENT The main treatment is antibiotic medicine, usually taken by mouth. The length of treatment depends on how soon after a tick bite you begin taking the medicine. In some cases, treatment is necessary for several weeks. If the infection is severe, IV antibiotics may be necessary. HOME CARE INSTRUCTIONS  Take your antibiotic medicine as directed by your health care provider. Finish the antibiotic even if you start to feel better.  You may take a probiotic in between doses of your antibiotic to help avoid stomach upset or diarrhea.  Check with your health care provider before supplementing your treatment. Many alternative therapies have not been proven and may be harmful to you.  Keep all follow-up visits as directed by your  health care provider. This is important. PREVENTION Reinfection is possible with another tick bite by an infected tick. Take these precautions to prevent an infection:  Cover your skin with light-colored clothing when outdoors in the spring and summer months.  Spray clothing and skin with bug spray. The spray should be 20-30% DEET.  Avoided wooded, grassy, and shaded areas.  Remove yard litter, brush, trash, and plants that attract deer and rodents.  Check yourself for ticks when you come indoors.  Wash clothing worn each day.  Check your pets for ticks before they come inside.  If you find a tick:  Remove it with tweezers.  Clean your hands and the bite area with rubbing alcohol or soap and water. Pregnant women should take special care to avoid tick bites because the infection can be passed along to the fetus. SEEK MEDICAL CARE IF:  You have symptoms after treatment.  You have removed a tick and want to bring it to your health care provider for testing. SEEK IMMEDIATE MEDICAL CARE IF:  You have an irregular heartbeat.  You have nerve pain.  Your face feels numb. MAKE SURE YOU:  Understand these instructions.  Will watch your condition.  Will get help right away if you are not doing well or get worse.   This information is not intended to replace advice given to you by your health care provider. Make sure you discuss any questions you have with your health care provider.   Document Released: 12/23/2000 Document Revised: 10/07/2014 Document  Reviewed: 02/01/2014 Elsevier Interactive Patient Education [employer].

## 2016-02-06 ENCOUNTER — Telehealth: Payer: Self-pay | Admitting: Family Medicine

## 2016-02-06 LAB — CBC WITH DIFFERENTIAL/PLATELET
BASOS: 0 %
Basophils Absolute: 0 10*3/uL (ref 0.0–0.2)
EOS (ABSOLUTE): 0.3 10*3/uL (ref 0.0–0.4)
EOS: 3 %
HEMATOCRIT: 43.9 % (ref 34.0–46.6)
HEMOGLOBIN: 14.6 g/dL (ref 11.1–15.9)
IMMATURE GRANS (ABS): 0 10*3/uL (ref 0.0–0.1)
IMMATURE GRANULOCYTES: 0 %
LYMPHS: 22 %
Lymphocytes Absolute: 2.1 10*3/uL (ref 0.7–3.1)
MCH: 29 pg (ref 26.6–33.0)
MCHC: 33.3 g/dL (ref 31.5–35.7)
MCV: 87 fL (ref 79–97)
Monocytes Absolute: 1 10*3/uL — ABNORMAL HIGH (ref 0.1–0.9)
Monocytes: 10 %
NEUTROS ABS: 6.2 10*3/uL (ref 1.4–7.0)
NEUTROS PCT: 65 %
PLATELETS: 317 10*3/uL (ref 150–379)
RBC: 5.04 x10E6/uL (ref 3.77–5.28)
RDW: 13.5 % (ref 12.3–15.4)
WBC: 9.6 10*3/uL (ref 3.4–10.8)

## 2016-02-06 LAB — CMP14+EGFR
A/G RATIO: 2 (ref 1.2–2.2)
ALT: 23 IU/L (ref 0–32)
AST: 13 IU/L (ref 0–40)
Albumin: 4.5 g/dL (ref 3.5–5.5)
Alkaline Phosphatase: 62 IU/L (ref 39–117)
BUN/Creatinine Ratio: 10 (ref 9–23)
BUN: 9 mg/dL (ref 6–20)
Bilirubin Total: 0.5 mg/dL (ref 0.0–1.2)
CALCIUM: 9.4 mg/dL (ref 8.7–10.2)
CO2: 25 mmol/L (ref 18–29)
Chloride: 101 mmol/L (ref 96–106)
Creatinine, Ser: 0.94 mg/dL (ref 0.57–1.00)
GFR calc Af Amer: 95 mL/min/{1.73_m2} (ref >59)
GFR, EST NON AFRICAN AMERICAN: 83 mL/min/{1.73_m2} (ref >59)
Globulin, Total: 2.2 g/dL (ref 1.5–4.5)
Glucose: 81 mg/dL (ref 65–99)
POTASSIUM: 3.9 mmol/L (ref 3.5–5.2)
Sodium: 140 mmol/L (ref 134–144)
Total Protein: 6.7 g/dL (ref 6.0–8.5)

## 2016-02-06 LAB — LYME AB/WESTERN BLOT REFLEX
LYME DISEASE AB, QUANT, IGM: <0.8 index (ref 0.00–0.79)
Lyme IgG/IgM Ab: <0.91 {ISR} (ref 0.00–0.90)

## 2016-02-06 LAB — ROCKY MTN SPOTTED FVR AB, IGM-BLOOD: RMSF IgM: 0.51 index (ref 0.00–0.89)

## 2016-02-07 ENCOUNTER — Other Ambulatory Visit: Payer: Self-pay | Admitting: *Deleted

## 2016-02-07 DIAGNOSIS — W57XXXA Bitten or stung by nonvenomous insect and other nonvenomous arthropods, initial encounter: Secondary | ICD-10-CM

## 2016-02-07 NOTE — Telephone Encounter (Signed)
Reviewed test results with pt

## 2016-02-21 ENCOUNTER — Telehealth: Payer: Self-pay | Admitting: Family Medicine

## 2016-02-21 NOTE — Telephone Encounter (Signed)
Patient is coming in tomorrow to be seen at 8:55.

## 2016-02-21 NOTE — Telephone Encounter (Signed)
If she is finished at least 2 weeks of antibiotics she can stop doxycycline.  If her symptoms continue, namely fevers and chills or she develops any rash or arthralgias, I would recommend follow-up  Laroy Apple, MD [facility] 02/21/2016, 10:48 AM

## 2016-02-22 ENCOUNTER — Ambulatory Visit (INDEPENDENT_AMBULATORY_CARE_PROVIDER_SITE_OTHER): Payer: BLUE CROSS/BLUE SHIELD | Admitting: Family Medicine

## 2016-02-22 ENCOUNTER — Encounter: Payer: Self-pay | Admitting: Family Medicine

## 2016-02-22 VITALS — BP 117/80 | HR 78 | Temp 97.7°F | Ht 62.0 in | Wt 174.0 lb

## 2016-02-22 DIAGNOSIS — A6920 Lyme disease, unspecified: Secondary | ICD-10-CM

## 2016-02-22 MED ORDER — ONDANSETRON HCL 4 MG PO TABS: 4.0000 mg | ORAL_TABLET | Freq: Three times a day (TID) | ORAL | Status: DC | PRN

## 2016-02-22 MED ORDER — AMOXICILLIN 500 MG PO CAPS: 500.0000 mg | ORAL_CAPSULE | Freq: Three times a day (TID) | ORAL | Status: DC

## 2016-02-22 MED ORDER — FLUCONAZOLE 150 MG PO TABS: 150.0000 mg | ORAL_TABLET | Freq: Once | ORAL | Status: DC

## 2016-02-22 NOTE — Progress Notes (Signed)
   HPI  Patient presents today here for follow-up of erythema migrans.  Patient had a tick bite is seen about 20 days ago with a targetoid rash on her left chest. She was given doxycycline by myself, however she had severe nausea with doxycycline and did not finish the medication. She took approximately 13 days worth of the medication, however he was intermittent and over the entire period of 3 weeks.  She is currently having chills, nausea, fatigue. Her rash has completely resolved, it went away very quickly.  She has no arthralgias or any other complaints besides feeling dehydrated and dry.  PMH: Smoking status noted ROS: Per HPI  Objective: BP 117/80 mmHg  Pulse 78  Temp(Src) 97.7 F (36.5 C) (Oral)  Ht 5\' 2"  (1.575 m)  Wt 174 lb (78.926 kg)  BMI 31.82 kg/m2 Gen: NAD, alert, cooperative with exam HEENT: NCAT CV: RRR, good S1/S2, no murmur Resp: CTABL, no wheezes, non-labored Ext: No edema, warm Neuro: Alert and oriented, No gross deficits  Assessment and plan:  # Erythema migrans Rash improved, incomplete treatment for Lyme disease Patient did not tolerate doxycycline, she phoned in one day ago to let us know this and ask for alternative treatments. On exam today she is well-appearing Given the high risk of long-term effects from 1 disease I recommended a complete three-week course of amoxicillin Given Zofran for nausea Diflucan for possible yeast infection   Meds ordered this encounter  Medications  . amoxicillin (AMOXIL) 500 MG capsule    Sig: Take 1 capsule (500 mg total) by mouth 3 (three) times daily.    Dispense:  63 capsule    Refill:  0  . fluconazole (DIFLUCAN) 150 MG tablet    Sig: Take 1 tablet (150 mg total) by mouth once.    Dispense:  2 tablet    Refill:  0  . ondansetron (ZOFRAN) 4 MG tablet    Sig: Take 1 tablet (4 mg total) by mouth every 8 (eight) hours as needed for nausea or vomiting.    Dispense:  20 tablet    Refill:  0    Laroy Apple, MD [facility] 02/22/2016, 9:19 AM

## 2016-02-22 NOTE — Patient Instructions (Signed)
Great to see you!  Please call if you have any questions

## 2016-02-27 ENCOUNTER — Telehealth: Payer: Self-pay | Admitting: Pediatrics

## 2016-02-27 NOTE — Telephone Encounter (Signed)
Appt made

## 2016-02-27 NOTE — Telephone Encounter (Signed)
In order to consider this patient request, the patient will need to see a provider 

## 2016-02-28 ENCOUNTER — Encounter: Payer: Self-pay | Admitting: Family Medicine

## 2016-02-28 ENCOUNTER — Ambulatory Visit (INDEPENDENT_AMBULATORY_CARE_PROVIDER_SITE_OTHER): Payer: BLUE CROSS/BLUE SHIELD | Admitting: Family Medicine

## 2016-02-28 VITALS — BP 106/70 | HR 78 | Temp 98.4°F | Ht 62.0 in | Wt 175.4 lb

## 2016-02-28 DIAGNOSIS — F411 Generalized anxiety disorder: Secondary | ICD-10-CM | POA: Diagnosis not present

## 2016-02-28 DIAGNOSIS — A6920 Lyme disease, unspecified: Secondary | ICD-10-CM

## 2016-02-28 DIAGNOSIS — T50905D Adverse effect of unspecified drugs, medicaments and biological substances, subsequent encounter: Secondary | ICD-10-CM

## 2016-02-28 DIAGNOSIS — T887XXD Unspecified adverse effect of drug or medicament, subsequent encounter: Secondary | ICD-10-CM

## 2016-02-28 MED ORDER — DULOXETINE HCL 30 MG PO CPEP: 30.0000 mg | ORAL_CAPSULE | Freq: Every day | ORAL | Status: DC

## 2016-02-28 NOTE — Progress Notes (Signed)
Subjective:  Patient ID: [name], female    DOB: 02/28/1986  Age: 30 y.o. MRN: [id number]  CC: gi upset   HPI [name] presents for increased vomiting with use of prozac. She had had nausea one first initiated 3 months ago. The nausea has been intermittent since then. It was severe once she started antibiotic treatment for erythema migrans/Lyme's disease. DCed med 5 days ago. Symptoms resolved almost instantly. Of note is that she was having trouble tolerating the amoxicillin as well as doxycycline before that as long as she was taking the Prozac. When she discontinued the Prozac she was able to tolerate the amoxicillin without nausea. Of note is that while she was taking the Prozac and the amoxicillin together even the Zofran did not relieve her nausea. She has some Lexapro 5 mg tablets that she started taking again once she discontinued the Prozac. However her depression symptoms have rebounded. Please note pH Q9 below  Depression screen [facility] 2/9 02/28/2016 02/22/2016 02/02/2016 11/03/2015 08/15/2015  Decreased Interest 3 0 0 1 0  Down, Depressed, Hopeless 1 0 0 2 0  PHQ - 2 Score 4 0 0 3 0  Altered sleeping 1 - - 0 -  Tired, decreased energy 3 - - 0 -  Change in appetite 3 - - 2 -  Feeling bad or failure about yourself  1 - - - -  Trouble concentrating 1 - - 2 -  Moving slowly or fidgety/restless 0 - - 2 -  Suicidal thoughts 0 - - 0 -  PHQ-9 Score 13 - - 9 -  Difficult doing work/chores Not difficult at all - - Somewhat difficult -   She states her problem is based primarily and anxiety. 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 She failed her first test even though she do 4.0 student as an undergrad.   History [name] has a past medical history of Nephrolithiasis; Concussion; Asthma; Anxiety; Depression; Herpes; Keratosis; Headache(784.0); and Postural orthostatic tachycardia syndrome.   She has past surgical history that includes  No past surgeries.   Her family history includes Alcohol abuse in her father, maternal grandfather, and paternal grandfather; Arthritis in her maternal aunt and maternal grandmother; COPD in her maternal grandmother; Cancer in her maternal grandfather and paternal grandmother; Diabetes in her father; Stroke in her paternal grandfather; Thyroid disease in her maternal grandmother. There is no history of Heart disease, Sudden death, or Cardiomyopathy.She reports that she has never smoked. She has never used smokeless tobacco. She reports that she does not drink alcohol or use illicit drugs.  Current Outpatient Prescriptions on File Prior to Visit  Medication Sig Dispense Refill  . albuterol (PROVENTIL HFA;VENTOLIN HFA) 108 (90 Base) MCG/ACT inhaler Inhale 2 puffs into the lungs every 6 (six) hours as needed for wheezing. 1 Inhaler 0  . amoxicillin (AMOXIL) 500 MG capsule Take 1 capsule (500 mg total) by mouth 3 (three) times daily. 63 capsule 0  . meclizine (ANTIVERT) 25 MG tablet Take 1 tablet (25 mg total) by mouth 3 (three) times daily as needed for dizziness. 30 tablet 0  . montelukast (SINGULAIR) 10 MG tablet Take 1 tablet (10 mg total) by mouth at bedtime. 30 tablet 5  . omeprazole (PRILOSEC) 20 MG capsule Take 1 capsule (20 mg total) by mouth 2 (two) times daily before a meal. (Patient taking differently: Take 20 mg by mouth daily. ) 60 capsule 3  . ondansetron (ZOFRAN) 4  MG tablet Take 1 tablet (4 mg total) by mouth every 8 (eight) hours as needed for nausea or vomiting. 20 tablet 0  . Spacer/Aero Chamber Mouthpiece MISC Please dispense one spacer for use with inhaler. 1 each 0  . fluconazole (DIFLUCAN) 150 MG tablet Take 1 tablet (150 mg total) by mouth once. (Patient not taking: Reported on 02/28/2016) 2 tablet 0  . FLUoxetine (PROZAC) 20 MG tablet Take 1 tablet (20 mg total) by mouth daily. (Patient not taking: Reported on 02/28/2016) 30 tablet 3   No current facility-administered medications  on file prior to visit.    ROS Review of Systems  Constitutional: Negative for fever, activity change and appetite change.  HENT: Negative for congestion, rhinorrhea and sore throat.   Eyes: Negative for visual disturbance.  Respiratory: Negative for cough and shortness of breath.   Cardiovascular: Negative for chest pain and palpitations.  Gastrointestinal: Positive for nausea. Negative for abdominal pain and diarrhea.  Genitourinary: Negative for dysuria.  Musculoskeletal: Negative for myalgias and arthralgias.    Objective:  BP 106/70 mmHg  Pulse 78  Temp(Src) 98.4 F (36.9 C) (Oral)  Ht 5\' 2"  (1.575 m)  Wt 175 lb 6.4 oz (79.561 kg)  BMI 32.07 kg/m2  SpO2 99%  Physical Exam  Constitutional: She is oriented to person, place, and time. She appears well-developed and well-nourished.  HENT:  Head: Normocephalic and atraumatic.  Cardiovascular: Normal rate and regular rhythm.   No murmur heard. Pulmonary/Chest: Effort normal and breath sounds normal.  Abdominal: Soft. Bowel sounds are normal. She exhibits no mass. There is tenderness (RLQ with deep palpation- mild). There is no rebound and no guarding.  Neurological: She is alert and oriented to person, place, and time.  Skin: Skin is warm and dry.  Psychiatric: She has a normal mood and affect. Her behavior is normal.    Assessment & Plan:   There are no diagnoses linked to this encounter. I am having Ms. Szeliga start on DULoxetine. I am also having her maintain her meclizine, omeprazole, albuterol, Spacer/Aero Chamber Mouthpiece, FLUoxetine, montelukast, amoxicillin, fluconazole, and ondansetron.  Meds ordered this encounter  Medications  . DULoxetine (CYMBALTA) 30 MG capsule    Sig: Take 1 capsule (30 mg total) by mouth daily. For one week then two daily. Take with a full stomach at suppertime    Dispense:  60 capsule    Refill:  0   Take the Cymbalta with food to avoid nausea. It is best with a full stomach  including a significant portion of a protein rich food including dairy or meat.  Continue the amoxicillin as is.  Follow-up: Return in about 1 month (around 03/29/2016) for With [facility].Claretta Fraise, M.D.

## 2016-02-28 NOTE — Patient Instructions (Signed)
Take the Cymbalta with food to avoid nausea. It is best with a full stomach including a significant portion of a protein rich food including dairy or meat.  Continue the amoxicillin as is.

## 2016-03-01 ENCOUNTER — Ambulatory Visit (INDEPENDENT_AMBULATORY_CARE_PROVIDER_SITE_OTHER): Payer: BLUE CROSS/BLUE SHIELD | Admitting: *Deleted

## 2016-03-01 DIAGNOSIS — Z111 Encounter for screening for respiratory tuberculosis: Secondary | ICD-10-CM

## 2016-03-01 NOTE — Progress Notes (Signed)
PPd skin test placed and patient will return on Monday for ppd read.

## 2016-03-01 NOTE — Patient Instructions (Signed)
Tuberculin Skin Test WHY AM I HAVING THIS TEST? Tuberculosis (TB) is a bacterial infection caused by Mycobacterium tuberculosis. Most people who are exposed to these bacteria have a strong enough defense (immune) system to prevent the bacteria from causing TB and developing symptoms. Their bodies prevent the germs from being active and making them sick (latent TB infection).  However, if you have TB germs in your body and your immune system is weak, you can develop a TB infection. This can cause symptoms such as:   Night sweats.  Fever.  Weakness.  Weight loss. A latent TB infection can also become active later in life if your immune system becomes weakened or compromised. You may have this test if your health care provider suspects that you have TB. You may also have this test to screen for TB if you are at risk for getting the disease. Those at increased risk include:  People who inject illegal drugs or share needles.  People with HIV or other diseases that affect immunity.  Health care workers.  People who live in high-risk communities, such as homeless shelters, nursing homes, and correctional facilities.  People who have been in contact with someone with TB.  People from countries where TB is more common. If you are in a high-risk group, your health care provider may wish to screen for TB more often. This can help prevent the spread of the disease. Sometimes TB screening is required when starting a new job, such as becoming a health care worker or a teacher. Colleges or universities may require it of new students. HOW WILL I BE TESTED? A tuberculin skin test is the main test used to check for exposure to the bacteria that can cause TB. The test checks for antibodies to the bacteria. Antibodies are proteins that your body produces to protect you from germs and other things that can make you sick. Your health care provider will inject a solution known as PPD (purified protein  derivative) under the first layer of skin on your arm. This causes a blister-like bubble to form at the site. Your health care provider will then examine the site after a number of hours have passed to see if a reaction has occurred. HOW DO I PREPARE FOR THE TEST? There is no preparation required for this test. WHAT DO THE RESULTS MEAN? Your test results will be reported as either negative or positive.  If the tuberculin skin test produces a negative result, it is likely that you do not have TB and have not been exposed to the TB bacteria. If you or your health care provider suspects exposure, however, you may want to repeat the test a few weeks later. A blood test may also be used to check for TB. This is because you will not react to the tuberculin skin test until several weeks after exposure to TB bacteria. If you test positive to the tuberculin skin test, it is likely that you have been exposed to TB bacteria. The test does not distinguish between an active and a latent TB infection. A false-positive result can occur. A false-positive result for TB bacteria is incorrect because it indicates a condition or finding is present when it is not. Talk to your health care provider to discuss your results, treatment options, and if necessary, the need for more tests. It is your responsibility to obtain your test results. Ask the lab or department performing the test when and how you will get your results. Talk with your   health care provider if you have any questions about your results.   This information is not intended to replace advice given to you by your health care provider. Make sure you discuss any questions you have with your health care provider.   Document Released: 06/26/2005 Document Revised: 10/07/2014 Document Reviewed: 01/10/2014 Elsevier Interactive Patient Education 2016 Elsevier Inc.  

## 2016-03-04 LAB — TB SKIN TEST
Induration: 0 mm
TB Skin Test: NEGATIVE

## 2016-04-03 ENCOUNTER — Ambulatory Visit (INDEPENDENT_AMBULATORY_CARE_PROVIDER_SITE_OTHER): Payer: BLUE CROSS/BLUE SHIELD | Admitting: Pediatrics

## 2016-04-03 ENCOUNTER — Encounter: Payer: Self-pay | Admitting: Pediatrics

## 2016-04-03 VITALS — BP 125/93 | HR 90 | Temp 97.2°F | Ht 62.0 in | Wt 178.6 lb

## 2016-04-03 DIAGNOSIS — F411 Generalized anxiety disorder: Secondary | ICD-10-CM

## 2016-04-03 DIAGNOSIS — A6920 Lyme disease, unspecified: Secondary | ICD-10-CM | POA: Diagnosis not present

## 2016-04-03 DIAGNOSIS — Z021 Encounter for pre-employment examination: Secondary | ICD-10-CM

## 2016-04-03 MED ORDER — DOXYCYCLINE HYCLATE 100 MG PO TABS: 100.0000 mg | ORAL_TABLET | Freq: Two times a day (BID) | ORAL | Status: DC

## 2016-04-03 MED ORDER — DULOXETINE HCL 30 MG PO CPEP: 30.0000 mg | ORAL_CAPSULE | Freq: Every day | ORAL | Status: DC

## 2016-04-03 MED ORDER — ONDANSETRON HCL 4 MG PO TABS: 4.0000 mg | ORAL_TABLET | Freq: Three times a day (TID) | ORAL | Status: DC | PRN

## 2016-04-03 NOTE — Progress Notes (Signed)
Subjective:    Patient ID: [name], female    DOB: 1986/06/05, 30 y.o.   MRN: [id number]  CC: Medication Refill   HPI: [name] is a 30 y.o. female presenting for Medication Refill  GAD: prozac for 3 months, made stomach worse On cymbalta 20m anxiety was doing better Now feels numb on the 647m Treated for lyme disease with doxycycline 10044mID for 2 weeks, took amoxicillin for 1 week afterwards because still feeling sick. Now she thinks it was because feeling nauseous because of the prozac, not the doxycycline    [facility] 04[facility] 02/28/2016 02/22/2016 02/02/2016 11/03/2015  Decreased Interest 3 3 0 0 1  Down, Depressed, Hopeless 3 1 0 0 2  PHQ - 2 Score 6 4 0 0 3  Altered sleeping 3 1 - - 0  Tired, decreased energy 3 3 - - 0  Change in appetite 0 3 - - 2  Feeling bad or failure about yourself  3 1 - - -  Trouble concentrating 1 1 - - 2  Moving slowly or fidgety/restless 0 0 - - 2  Suicidal thoughts 1 0 - - 0  PHQ-9 Score 17 13 - - 9  Difficult doing work/chores Somewhat difficult Not difficult at all - - Somewhat difficult     Relevant past medical, surgical, family and social history reviewed and updated as indicated.  Interim medical history since our last visit reviewed. Allergies and medications reviewed and updated.  ROS: Per HPI unless specifically indicated above  History  Smoking status  . Never Smoker   Smokeless tobacco  . Never Used       Objective:    BP 125/93 mmHg  Pulse 90  Temp(Src) 97.2 F (36.2 C) (Oral)  Ht 5' 2"  (1.575 m)  Wt 178 lb 9.6 oz (81.012 kg)  BMI 32.66 kg/m2  Wt Readings from Last 3 Encounters:  04/03/16 178 lb 9.6 oz (81.012 kg)  02/28/16 175 lb 6.4 oz (79.561 kg)  02/22/16 174 lb (78.926 kg)     Gen: NAD, alert, cooperative with exam, NCAT EYES: EOMI, no scleral injection or icterus CV: NRRR, normal S1/S2, no murmur, distal pulses 2+ b/l Resp: CTABL, no wheezes, normal WOB Abd: +BS, soft,  NTND. no guarding or organomegaly Ext: No edema, warm Neuro: Alert and oriented Skin: R anterior shoulder with 1mm73mab, minimal surrounding redness Psych: normal affect, well groomed     Assessment & Plan:    [name] seen today for medication refill and med problem f/u.  Diagnoses and all orders for this visit:  Erythema migrans (Lyme disease) Never finished a course of antibitoics Initial lyme titers negative Has picture on phone of apprx 10 cm in diameter EM rash from her shoulder taken apprx 4 weeks ago -     doxycycline (VIBRA-TABS) 100 MG tablet; Take 1 tablet (100 mg total) by mouth 2 (two) times daily. 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  Other orders -     ondansetron (ZOFRAN) 4 MG tablet; Take 1 tablet (4 mg total) by mouth every 8 (eight) hours as needed for nausea or vomiting.  Follow up plan: Return in about 3 months (around 07/04/2016).  Assunta Found, MD [facility] 04/03/2016, 5:17 PM

## 2016-04-04 LAB — LYME AB/WESTERN BLOT REFLEX
LYME DISEASE AB, QUANT, IGM: <0.8 index (ref 0.00–0.79)
Lyme IgG/IgM Ab: <0.91 {ISR} (ref 0.00–0.90)

## 2016-04-04 LAB — RUBEOLA ANTIBODY IGG: RUBEOLA AB, IGG: 27.3 [AU]/ml — AB (ref >29.9)

## 2016-04-16 NOTE — Patient Instructions (Addendum)
Your procedure is scheduled on:  Wednesday, May 01, 2016  Enter through the [employer] [facility] at:  6:00 AM  Pick up the phone at the desk and dial [phone].  Call this number if you have problems the morning of surgery: [phone].  Remember: Do NOT eat food or  drink after:  Midnight Tuesday, April 30, 2016  Take these medicines the morning of surgery with a SIP OF WATER:  Cymbalta  Bring Asthma Inhaler day of surgery  Do NOT wear jewelry (body piercing), metal hair clips/bobby pins, make-up, or nail polish. Do NOT wear lotions, powders, or perfumes.  You may wear deodorant. Do NOT shave for 48 hours prior to surgery. Do NOT bring valuables to the hospital. Contacts, dentures, or bridgework may not be worn into surgery.  Leave suitcase in car.  After surgery it may be brought to your room.  For patients admitted to the hospital, checkout time is 11:00 AM the day of discharge.

## 2016-04-17 ENCOUNTER — Encounter (HOSPITAL_COMMUNITY): Payer: Self-pay

## 2016-04-17 ENCOUNTER — Encounter (HOSPITAL_COMMUNITY)
Admission: RE | Admit: 2016-04-17 | Discharge: 2016-04-17 | Disposition: A | Discharge status: Home / Self Care | Payer: BLUE CROSS/BLUE SHIELD | Source: Ambulatory Visit | Attending: Obstetrics and Gynecology | Admitting: Obstetrics and Gynecology

## 2016-04-17 DIAGNOSIS — Z01812 Encounter for preprocedural laboratory examination: Secondary | ICD-10-CM | POA: Insufficient documentation

## 2016-04-17 HISTORY — DX: Nausea with vomiting, unspecified: R11.2

## 2016-04-17 HISTORY — DX: Calculus of kidney: N20.0

## 2016-04-17 HISTORY — DX: Other specified postprocedural states: Z98.890

## 2016-04-17 HISTORY — DX: Paresthesia of skin: R20.2

## 2016-04-17 HISTORY — DX: Gastro-esophageal reflux disease without esophagitis: K21.9

## 2016-04-17 LAB — CBC
HEMATOCRIT: 43.5 % (ref 36.0–46.0)
HEMOGLOBIN: 15.2 g/dL — AB (ref 12.0–15.0)
MCH: 30.5 pg (ref 26.0–34.0)
MCHC: 34.9 g/dL (ref 30.0–36.0)
MCV: 87.2 fL (ref 78.0–100.0)
Platelets: 330 10*3/uL (ref 150–400)
RBC: 4.99 MIL/uL (ref 3.87–5.11)
RDW: 13 % (ref 11.5–15.5)
WBC: 16.4 10*3/uL — AB (ref 4.0–10.5)

## 2016-04-28 NOTE — Anesthesia Preprocedure Evaluation (Addendum)
Anesthesia Evaluation  Patient identified by MRN, date of birth, ID band Patient awake    Reviewed: Allergy & Precautions, NPO status , Patient's Chart, lab work & pertinent test results  History of Anesthesia Complications (+) PONV and history of anesthetic complications  Airway Mallampati: II  TM Distance: >3 FB Neck ROM: Full    Dental no notable dental hx. (+) Dental Advisory Given   Pulmonary asthma ,    Pulmonary exam normal breath sounds clear to auscultation       Cardiovascular negative cardio ROS Normal cardiovascular exam Rhythm:Regular Rate:Normal     Neuro/Psych  Headaches, PSYCHIATRIC DISORDERS Anxiety Depression    GI/Hepatic Neg liver ROS, GERD  Medicated and Controlled,  Endo/Other  obesity  Renal/GU negative Renal ROS  negative genitourinary   Musculoskeletal negative musculoskeletal ROS (+)   Abdominal   Peds negative pediatric ROS (+)  Hematology negative hematology ROS (+)   Anesthesia Other Findings   Reproductive/Obstetrics negative OB ROS                            Anesthesia Physical Anesthesia Plan  ASA: II  Anesthesia Plan: Spinal   Post-op Pain Management:    Induction:   Airway Management Planned:   Additional Equipment:   Intra-op Plan:   Post-operative Plan:   Informed Consent: I have reviewed the patients History and Physical, chart, labs and discussed the procedure including the risks, benefits and alternatives for the proposed anesthesia with the patient or authorized representative who has indicated his/her understanding and acceptance.   Dental advisory given  Plan Discussed with: CRNA  Anesthesia Plan Comments:        Anesthesia Quick Evaluation

## 2016-04-30 NOTE — H&P (Signed)
[name] is an 30 y.o. female. I saw her as a consult for Dr. Sandford Craze last November for stress incontinence.  She leaks (squirt) with cough, laugh, sneeze, all c/w SUI, and has hypermobile urethra on exam.  She also has a cystocele and tiny rectocele.  Options were discussed with her then, she is just now deciding to proceed with surgical therapy.  Pertinent Gynecological History: Last pap: normal Date: 2016 OB History: G3, P3003 SVD x 3   Menstrual History: No LMP recorded. Patient is not currently having periods (Reason: IUD).    Past Medical History:  Diagnosis Date  . Anxiety   . Asthma   . Concussion   . Depression   . GERD (gastroesophageal reflux disease)   . Headache(784.0)    Migraines  . Herpes   . Keratosis   . Kidney stone   . Nephrolithiasis   . PONV (postoperative nausea and vomiting)    prolonged sedation  . Postural orthostatic tachycardia syndrome   . Tingling in extremities    occ    Past Surgical History:  Procedure Laterality Date  . BREAST ENHANCEMENT SURGERY    . NO PAST SURGERIES      Family History  Problem Relation Age of Onset  . Heart disease Neg Hx     early heart disease  . Sudden death Neg Hx     cardiac  . Cardiomyopathy Neg Hx   . Alcohol abuse Father   . Diabetes Father   . Arthritis Maternal Aunt   . Arthritis Maternal Grandmother   . COPD Maternal Grandmother   . Thyroid disease Maternal Grandmother   . Alcohol abuse Maternal Grandfather   . Cancer Maternal Grandfather     liver, pancreatic  . Cancer Paternal Grandmother     brain  . Alcohol abuse Paternal Grandfather   . Stroke Paternal Grandfather     Social History:  reports that she has never smoked. She has never used smokeless tobacco. She reports that she does not drink alcohol or use drugs.  Allergies:  Allergies  Allergen Reactions  . Prozac [Fluoxetine Hcl] Nausea Only  . Latex Rash    No prescriptions prior to admission.    Review of  Systems  Respiratory: Negative.   Cardiovascular: Negative.   Gastrointestinal: Positive for heartburn.    There were no vitals taken for this visit. Physical Exam  Constitutional: She appears well-developed and well-nourished.  Neck: Neck supple. No thyromegaly present.  Cardiovascular: Normal rate, regular rhythm and normal heart sounds.   No murmur heard. Respiratory: Effort normal and breath sounds normal. No respiratory distress. She has no wheezes.  GI: Soft. She exhibits no distension and no mass. There is no tenderness.  Genitourinary: Uterus normal.  Genitourinary Comments: Gr I-II cystocele, minimal rectocele, hypermobile urethra    No results found for this or any previous visit (from the past 24 hour(s)).  No results found.  Assessment/Plan: SUI and cystocele, small rectocele.  Medical and surgical options have been discussed, she wishes to proceed with surgical therapy.  Surgical procedure, risks, chances of relieving her symptoms, specific risks associated with mesh sling, have all been discussed.  Will admit for anterior repair, Solyx sling, possible posterior repair.    Storey Stangeland D 04/30/2016, 8:24 PM

## 2016-05-01 ENCOUNTER — Ambulatory Visit (HOSPITAL_COMMUNITY): Payer: BLUE CROSS/BLUE SHIELD | Admitting: Anesthesiology

## 2016-05-01 ENCOUNTER — Encounter (HOSPITAL_COMMUNITY)
Admission: RE | Disposition: A | Discharge status: Home / Self Care | Payer: Self-pay | Source: Ambulatory Visit | Attending: Obstetrics and Gynecology

## 2016-05-01 ENCOUNTER — Ambulatory Visit (HOSPITAL_COMMUNITY)
Admission: RE | Admit: 2016-05-01 | Discharge: 2016-05-02 | Disposition: A | Discharge status: Home / Self Care | Payer: BLUE CROSS/BLUE SHIELD | Source: Ambulatory Visit | Attending: Obstetrics and Gynecology | Admitting: Obstetrics and Gynecology

## 2016-05-01 ENCOUNTER — Encounter (HOSPITAL_COMMUNITY): Payer: Self-pay

## 2016-05-01 DIAGNOSIS — N3641 Hypermobility of urethra: Secondary | ICD-10-CM | POA: Insufficient documentation

## 2016-05-01 DIAGNOSIS — K219 Gastro-esophageal reflux disease without esophagitis: Secondary | ICD-10-CM | POA: Insufficient documentation

## 2016-05-01 DIAGNOSIS — Z6833 Body mass index (BMI) 33.0-33.9, adult: Secondary | ICD-10-CM | POA: Insufficient documentation

## 2016-05-01 DIAGNOSIS — N816 Rectocele: Secondary | ICD-10-CM | POA: Insufficient documentation

## 2016-05-01 DIAGNOSIS — N393 Stress incontinence (female) (male): Secondary | ICD-10-CM | POA: Diagnosis not present

## 2016-05-01 DIAGNOSIS — E669 Obesity, unspecified: Secondary | ICD-10-CM | POA: Diagnosis not present

## 2016-05-01 DIAGNOSIS — N8110 Cystocele, unspecified: Secondary | ICD-10-CM | POA: Insufficient documentation

## 2016-05-01 HISTORY — PX: CYSTOCELE REPAIR: SHX163

## 2016-05-01 HISTORY — PX: BLADDER SUSPENSION: SHX72

## 2016-05-01 LAB — PREGNANCY, URINE: PREG TEST UR: NEGATIVE

## 2016-05-01 SURGERY — URETHROPEXY, USING TRANSVAGINAL TAPE
Anesthesia: Spinal | Site: Vagina

## 2016-05-01 MED ORDER — ONDANSETRON HCL 4 MG/2ML IJ SOLN
INTRAMUSCULAR | Status: AC
  Filled 2016-05-01: qty 2

## 2016-05-01 MED ORDER — PROPOFOL 500 MG/50ML IV EMUL
INTRAVENOUS | Status: AC
  Filled 2016-05-01: qty 50

## 2016-05-01 MED ORDER — ESTRADIOL 0.1 MG/GM VA CREA
TOPICAL_CREAM | VAGINAL | Status: DC | PRN
  Administered 2016-05-01: 1 via VAGINAL

## 2016-05-01 MED ORDER — LIDOCAINE HCL (CARDIAC) 20 MG/ML IV SOLN
INTRAVENOUS | Status: DC | PRN
  Administered 2016-05-01: 20 mg via INTRAVENOUS

## 2016-05-01 MED ORDER — DULOXETINE HCL 30 MG PO CPEP
30.0000 mg | ORAL_CAPSULE | Freq: Every day | ORAL | Status: DC
  Filled 2016-05-01 (×2): qty 1

## 2016-05-01 MED ORDER — BUPIVACAINE-EPINEPHRINE (PF) 0.5% -1:200000 IJ SOLN
INTRAMUSCULAR | Status: AC
  Filled 2016-05-01: qty 30

## 2016-05-01 MED ORDER — LIDOCAINE HCL 1 % IJ SOLN
INTRAMUSCULAR | Status: AC
  Filled 2016-05-01: qty 20

## 2016-05-01 MED ORDER — SCOPOLAMINE 1 MG/3DAYS TD PT72
1.0000 | MEDICATED_PATCH | Freq: Once | TRANSDERMAL | Status: DC
  Administered 2016-05-01: 1.5 mg via TRANSDERMAL

## 2016-05-01 MED ORDER — DULOXETINE HCL 30 MG PO CPEP
30.0000 mg | ORAL_CAPSULE | Freq: Every day | ORAL | Status: DC
  Administered 2016-05-01: 30 mg via ORAL
  Filled 2016-05-01: qty 1

## 2016-05-01 MED ORDER — PROPOFOL 10 MG/ML IV BOLUS
INTRAVENOUS | Status: AC
  Filled 2016-05-01: qty 20

## 2016-05-01 MED ORDER — ESTRADIOL 0.1 MG/GM VA CREA
TOPICAL_CREAM | VAGINAL | Status: AC
  Filled 2016-05-01: qty 42.5

## 2016-05-01 MED ORDER — PROPOFOL 500 MG/50ML IV EMUL
INTRAVENOUS | Status: DC | PRN
  Administered 2016-05-01: 100 ug/kg/min via INTRAVENOUS

## 2016-05-01 MED ORDER — ONDANSETRON HCL 4 MG/2ML IJ SOLN
INTRAMUSCULAR | Status: DC | PRN
Start: 2016-05-01 — End: 2016-05-01
  Administered 2016-05-01: 4 mg via INTRAVENOUS

## 2016-05-01 MED ORDER — BUPIVACAINE-EPINEPHRINE 0.5% -1:200000 IJ SOLN
INTRAMUSCULAR | Status: DC | PRN
  Administered 2016-05-01: 10 mL

## 2016-05-01 MED ORDER — DEXTROSE-NACL 5-0.45 % IV SOLN: INTRAVENOUS | Status: DC

## 2016-05-01 MED ORDER — ALUM & MAG HYDROXIDE-SIMETH 200-200-20 MG/5ML PO SUSP: 30.0000 mL | ORAL | Status: DC | PRN

## 2016-05-01 MED ORDER — BUPIVACAINE IN DEXTROSE 0.75-8.25 % IT SOLN
INTRATHECAL | Status: DC | PRN
  Administered 2016-05-01: 1.8 mg via INTRATHECAL

## 2016-05-01 MED ORDER — ONDANSETRON HCL 4 MG/2ML IJ SOLN: 4.0000 mg | Freq: Once | INTRAMUSCULAR | Status: DC | PRN

## 2016-05-01 MED ORDER — CEFAZOLIN SODIUM-DEXTROSE 2-4 GM/100ML-% IV SOLN
2.0000 g | INTRAVENOUS | Status: AC
  Administered 2016-05-01: 2 g via INTRAVENOUS

## 2016-05-01 MED ORDER — KETOROLAC TROMETHAMINE 30 MG/ML IJ SOLN
30.0000 mg | Freq: Four times a day (QID) | INTRAMUSCULAR | Status: DC
  Administered 2016-05-01 – 2016-05-02 (×2): 30 mg via INTRAVENOUS
  Filled 2016-05-01 (×3): qty 1

## 2016-05-01 MED ORDER — STERILE WATER FOR IRRIGATION IR SOLN
Status: DC | PRN
  Administered 2016-05-01: 1000 mL

## 2016-05-01 MED ORDER — BUPIVACAINE IN DEXTROSE 0.75-8.25 % IT SOLN: INTRATHECAL | Status: DC | PRN

## 2016-05-01 MED ORDER — FENTANYL CITRATE (PF) 100 MCG/2ML IJ SOLN
INTRAMUSCULAR | Status: AC
  Filled 2016-05-01: qty 2

## 2016-05-01 MED ORDER — KETOROLAC TROMETHAMINE 30 MG/ML IJ SOLN
INTRAMUSCULAR | Status: DC | PRN
  Administered 2016-05-01: 30 mg via INTRAVENOUS

## 2016-05-01 MED ORDER — MIDAZOLAM HCL 2 MG/2ML IJ SOLN
INTRAMUSCULAR | Status: AC
  Filled 2016-05-01: qty 2

## 2016-05-01 MED ORDER — ONDANSETRON HCL 4 MG/2ML IJ SOLN: 4.0000 mg | Freq: Four times a day (QID) | INTRAMUSCULAR | Status: DC | PRN

## 2016-05-01 MED ORDER — KETOROLAC TROMETHAMINE 30 MG/ML IJ SOLN: 30.0000 mg | Freq: Four times a day (QID) | INTRAMUSCULAR | Status: DC

## 2016-05-01 MED ORDER — OXYCODONE-ACETAMINOPHEN 5-325 MG PO TABS
1.0000 | ORAL_TABLET | ORAL | Status: DC | PRN
  Administered 2016-05-01: 1 via ORAL
  Administered 2016-05-02: 2 via ORAL
  Filled 2016-05-01: qty 2
  Filled 2016-05-01: qty 1

## 2016-05-01 MED ORDER — KETOROLAC TROMETHAMINE 30 MG/ML IJ SOLN: 30.0000 mg | Freq: Once | INTRAMUSCULAR | Status: DC

## 2016-05-01 MED ORDER — DEXAMETHASONE SODIUM PHOSPHATE 10 MG/ML IJ SOLN
INTRAMUSCULAR | Status: AC
  Filled 2016-05-01: qty 1

## 2016-05-01 MED ORDER — SCOPOLAMINE 1 MG/3DAYS TD PT72
MEDICATED_PATCH | TRANSDERMAL | Status: AC
  Administered 2016-05-01: 1.5 mg via TRANSDERMAL
  Filled 2016-05-01: qty 1

## 2016-05-01 MED ORDER — LACTATED RINGERS IV SOLN
INTRAVENOUS | Status: DC
  Administered 2016-05-01 (×2): via INTRAVENOUS

## 2016-05-01 MED ORDER — FENTANYL CITRATE (PF) 100 MCG/2ML IJ SOLN: 25.0000 ug | INTRAMUSCULAR | Status: DC | PRN

## 2016-05-01 MED ORDER — MENTHOL 3 MG MT LOZG: 1.0000 | LOZENGE | OROMUCOSAL | Status: DC | PRN

## 2016-05-01 MED ORDER — LIDOCAINE HCL (CARDIAC) 20 MG/ML IV SOLN
INTRAVENOUS | Status: AC
  Filled 2016-05-01: qty 5

## 2016-05-01 MED ORDER — ALBUTEROL SULFATE (2.5 MG/3ML) 0.083% IN NEBU: 2.5000 mg | INHALATION_SOLUTION | Freq: Four times a day (QID) | RESPIRATORY_TRACT | Status: DC | PRN

## 2016-05-01 MED ORDER — LIDOCAINE HCL 1 % IJ SOLN
INTRAMUSCULAR | Status: DC | PRN
  Administered 2016-05-01: 10 mL

## 2016-05-01 MED ORDER — FAMOTIDINE 20 MG PO TABS
10.0000 mg | ORAL_TABLET | Freq: Two times a day (BID) | ORAL | Status: DC
  Administered 2016-05-01 – 2016-05-02 (×2): 10 mg via ORAL
  Filled 2016-05-01 (×2): qty 1

## 2016-05-01 MED ORDER — IBUPROFEN 600 MG PO TABS: 600.0000 mg | ORAL_TABLET | Freq: Four times a day (QID) | ORAL | Status: DC | PRN

## 2016-05-01 MED ORDER — SIMETHICONE 80 MG PO CHEW
80.0000 mg | CHEWABLE_TABLET | Freq: Four times a day (QID) | ORAL | Status: DC | PRN
Start: 2016-05-01 — End: 2016-05-02

## 2016-05-01 MED ORDER — ONDANSETRON HCL 4 MG PO TABS: 4.0000 mg | ORAL_TABLET | Freq: Four times a day (QID) | ORAL | Status: DC | PRN

## 2016-05-01 SURGICAL SUPPLY — 36 items
BLADE SURG 15 STRL LF C SS BP (BLADE) ×1 IMPLANT
BLADE SURG 15 STRL SS (BLADE) ×2
CANISTER SUCT 3000ML (MISCELLANEOUS) ×2 IMPLANT
CATH ROBINSON RED A/P 16FR (CATHETERS) IMPLANT
CLOTH BEACON ORANGE TIMEOUT ST (SAFETY) ×2 IMPLANT
CONT PATH 16OZ SNAP LID 3702 (MISCELLANEOUS) IMPLANT
DECANTER SPIKE VIAL GLASS SM (MISCELLANEOUS) IMPLANT
GAUZE PACKING 2X5 YD STRL (GAUZE/BANDAGES/DRESSINGS) ×1 IMPLANT
GLOVE BIO SURGEON STRL SZ8 (GLOVE) ×2 IMPLANT
GLOVE BIOGEL PI IND STRL 6.5 (GLOVE) ×1 IMPLANT
GLOVE BIOGEL PI IND STRL 7.0 (GLOVE) ×1 IMPLANT
GLOVE BIOGEL PI INDICATOR 6.5 (GLOVE) ×1
GLOVE BIOGEL PI INDICATOR 7.0 (GLOVE) ×1
GLOVE ORTHO TXT STRL SZ7.5 (GLOVE) ×2 IMPLANT
GOWN STRL REUS W/TWL LRG LVL3 (GOWN DISPOSABLE) ×4 IMPLANT
LIQUID BAND (GAUZE/BANDAGES/DRESSINGS) IMPLANT
NDL MAYO CATGUT SZ4 TPR NDL (NEEDLE) IMPLANT
NEEDLE HYPO 22GX1.5 SAFETY (NEEDLE) ×2 IMPLANT
NEEDLE MAYO CATGUT SZ4 (NEEDLE) IMPLANT
NS IRRIG 1000ML POUR BTL (IV SOLUTION) ×2 IMPLANT
PACK VAGINAL WOMENS (CUSTOM PROCEDURE TRAY) ×2 IMPLANT
SET CYSTO W/LG BORE CLAMP LF (SET/KITS/TRAYS/PACK) IMPLANT
SLING SOLYX SYSTEM SIS EA (Sling) ×1 IMPLANT
SUT PROLENE 1 CTX 30  8455H (SUTURE)
SUT PROLENE 1 CTX 30 8455H (SUTURE) IMPLANT
SUT SILK 0 SH 30 (SUTURE) ×2 IMPLANT
SUT VIC AB 2-0 CT1 (SUTURE) ×5 IMPLANT
SUT VIC AB 2-0 CT2 27 (SUTURE) ×7 IMPLANT
SUT VIC AB 3-0 CT1 27 (SUTURE)
SUT VIC AB 3-0 CT1 TAPERPNT 27 (SUTURE) IMPLANT
SUT VIC AB 3-0 SH 27 (SUTURE)
SUT VIC AB 3-0 SH 27X BRD (SUTURE) IMPLANT
SUT VICRYL 0 UR6 27IN ABS (SUTURE) IMPLANT
TOWEL OR 17X24 6PK STRL BLUE (TOWEL DISPOSABLE) ×4 IMPLANT
TRAY FOLEY CATH SILVER 14FR (SET/KITS/TRAYS/PACK) ×2 IMPLANT
WATER STERILE IRR 1000ML POUR (IV SOLUTION) ×1 IMPLANT

## 2016-05-01 NOTE — Transfer of Care (Signed)
Immediate Anesthesia Transfer of Care Note  Patient: Alvira Monday  Procedure(s) Performed: Procedure(s): TRANSVAGINAL TAPE (TVT) PROCEDURE (N/A) ANTERIOR REPAIR (CYSTOCELE) (N/A)  Patient Location: PACU  Anesthesia Type:Spinal  Level of Consciousness: awake  Airway & Oxygen Therapy: Patient Spontanous Breathing and Patient connected to nasal cannula oxygen  Post-op Assessment: Report given to RN and Post -op Vital signs reviewed and stable  Post vital signs: stable  Last Vitals:  Vitals:   05/01/16 0607  BP: (!) 126/97  Pulse: 75  Resp: 18  Temp: 36.4 C    Last Pain:  Vitals:   05/01/16 0607  TempSrc: Oral      Patients Stated Pain Goal: 4 ([id number])  Complications: No apparent anesthesia complications

## 2016-05-01 NOTE — Interval H&P Note (Signed)
History and Physical Interval Note:  05/01/2016 7:10 AM  [name]  has presented today for surgery, with the diagnosis of SUI and Cystocele  The various methods of treatment have been discussed with the patient and family. After consideration of risks, benefits and other options for treatment, the patient has consented to  Procedure(s): TRANSVAGINAL TAPE (TVT) PROCEDURE (N/A) ANTERIOR REPAIR (CYSTOCELE) (N/A) as a surgical intervention .  The patient's history has been reviewed, patient examined, no change in status, stable for surgery.  I have reviewed the patient's chart and labs.  Questions were answered to the patient's satisfaction.     [name]

## 2016-05-01 NOTE — Anesthesia Postprocedure Evaluation (Signed)
Anesthesia Post Note  Patient: [name]  Procedure(s) Performed: Procedure(s) (LRB): TRANSVAGINAL TAPE (TVT) PROCEDURE (N/A) ANTERIOR REPAIR (CYSTOCELE) (N/A)  Patient location during evaluation: [facility] Anesthesia Type: Spinal Level of consciousness: awake, awake and alert, oriented and patient cooperative Pain management: pain level controlled Vital Signs Assessment: post-procedure vital signs reviewed and stable Respiratory status: spontaneous breathing, nonlabored ventilation and respiratory function stable Cardiovascular status: stable Postop Assessment: no headache, no backache, patient able to bend at knees, no signs of nausea or vomiting and spinal receding Anesthetic complications: no     Last Vitals:  Vitals:   05/01/16 0947 05/01/16 1045  BP: 123/73 107/79  Pulse: 63 61  Resp: 18 18  Temp: 36.9 C 36.5 C    Last Pain:  Vitals:   05/01/16 1045  TempSrc: Oral   Pain Goal: Patients Stated Pain Goal: 0 (05/01/16 1045)               Tibor Lemmons L

## 2016-05-01 NOTE — Anesthesia Procedure Notes (Signed)
Spinal  Patient location during procedure: OR Staffing Anesthesiologist: Lauretta Grill Performed: anesthesiologist  Preanesthetic Checklist Completed: patient identified, site marked, surgical consent, pre-op evaluation, timeout performed, IV checked, risks and benefits discussed and monitors and equipment checked Spinal Block Patient position: sitting Prep: ChloraPrep Patient monitoring: continuous pulse ox, blood pressure and heart rate Approach: midline Location: L3-4 Injection technique: single-shot Needle Needle type: Pencan  Needle gauge: 24 G Needle length: 9 cm Additional Notes Functioning IV was confirmed and monitors were applied. Sterile prep and drape, including hand hygiene, mask and sterile gloves were used. The patient was positioned and the spine was prepped. The skin was anesthetized with lidocaine.  Free flow of clear CSF was obtained prior to injecting local anesthetic into the CSF.  The spinal needle aspirated freely following injection.  The needle was carefully withdrawn.  The patient tolerated the procedure well. Consent was obtained prior to procedure with all questions answered and concerns addressed. Risks including but not limited to bleeding, infection, nerve damage, paralysis, failed block, inadequate analgesia, allergic reaction, high spinal, itching and headache were discussed and the patient wished to proceed.   Lauretta Grill, MD

## 2016-05-01 NOTE — Progress Notes (Signed)
Day of Surgery Procedure(s) (LRB): TRANSVAGINAL TAPE (TVT) PROCEDURE (N/A) ANTERIOR REPAIR (CYSTOCELE) (N/A)  Subjective: Patient reports tolerating PO and + flatus.  Some pressure from vaginal packing but not too bothersome  Objective: I have reviewed patient's vital signs, intake and output and labs.  General: alert and cooperative Resp: CTA bilaterally GI: soft NT Vaginal Bleeding: minimal  Assessment: s/p Procedure(s): TRANSVAGINAL TAPE (TVT) PROCEDURE (N/A) ANTERIOR REPAIR (CYSTOCELE) (N/A): stable, progressing well and tolerating diet  Plan: Doing well.  Plan d/c foley and packing in AM  LOS: 0 days    Snyder Colavito W 05/01/2016, 6:26 PM

## 2016-05-01 NOTE — Anesthesia Postprocedure Evaluation (Signed)
Anesthesia Post Note  Patient: [name]  Procedure(s) Performed: Procedure(s) (LRB): TRANSVAGINAL TAPE (TVT) PROCEDURE (N/A) ANTERIOR REPAIR (CYSTOCELE) (N/A)  Patient location during evaluation: PACU Anesthesia Type: Spinal Level of consciousness: oriented and awake and alert Pain management: pain level controlled Vital Signs Assessment: post-procedure vital signs reviewed and stable Respiratory status: spontaneous breathing, respiratory function stable and patient connected to nasal cannula oxygen Cardiovascular status: blood pressure returned to baseline and stable Postop Assessment: no headache, no backache, spinal receding and patient able to bend at knees Anesthetic complications: no     Last Vitals:  Vitals:   05/01/16 0947 05/01/16 1045  BP: 123/73 107/79  Pulse: 63 61  Resp: 18 18  Temp: 36.9 C 36.5 C    Last Pain:  Vitals:   05/01/16 1045  TempSrc: Oral   Pain Goal: Patients Stated Pain Goal: 0 (05/01/16 1045)               [name]

## 2016-05-01 NOTE — Op Note (Signed)
Preoperative diagnosis: SUI and cystocele Postoperative diagnosis:  Same Procedure: Anterior repair, Solyx sling Surgeon: Cheri Fowler M.D. Asst.: Crawford Givens, D.O. Anesthesia: Spinal Findings: She had a Gr I-II cystocele, hypermobile urethra, normal bladder and urethra via cystoscopy  estimated blood loss: 123XX123 cc Complications: None Specimens: None  Procedure in detail:  The patient was taken to the operating room and placed in the sitting position. Dr. Lauretta Grill instilled spinal anesthesia. She was then placed in the dorsosupine position. Once the level of her spinal was found to be ascending appropriately her legs were placed in mobile stirrups. Perineum and vagina were then prepped and draped in usual sterile fashion and bladder drained with a Robinson catheter. A weighted speculum was placed in the vagina. The anterior vaginal fornix just distal to the cervix was grasped with 2 Allis clamps and a small horizontal piece of vaginal tissue was removed. The cystocele was then mobilized by dissecting anteriorly in the midline from the vaginal apex to within about 2 cm of the urethral meatus in the midline. Cystocele was mobilized laterally. Cystocele was then reduced with 3 interrupted sutures of 2-0 Vicryl with adequate reduction. A small amount of excess vaginal tissue was removed and the incision was closed with running locking 2-0 Vicryl with adequate closure and adequate hemostasis.   The anterior vagina was grasped with Allis clamps proximally 1 1/2 fingerbreadths from the urethral meatus. Local anesthetic with half percent Marcaine with epi mixed with 1% lidocaine was infiltrated in the midline and bilaterally for hydrodissection. A 1 cm vertical incision was was then made in the vagina between the Allis clamps. The edges of the incision were then grasped with the Allis clamps. Metzenbaum scissors were used to sharply dissected the vaginal mucosa to each pubic ramus. The Solyx sling was  then first placed on the patient's right side and anchored behind the pubic bone. A good placement was achieved on the right side. Placement was achieved on the left side in a similar fashion submucosal to just past the pubic ramus. A right angle clamp was able to just barely be passed between the sling and the urethral tissue confirming good tension. Cystoscopy was performed which revealed a normal bladder and no evidence of injury to the bladder or the urethra. 200 cc of fluid was used for the cystoscopy. The cystoscope was removed. A Cred maneuver was performed and a small amount of  leakage was seen. The Solyx sling was pushed in a little bit on the left side, no further leakage with crede maneuver, the sling was released on the left side. The vaginal incision was then closed with running locking 2-0 Vicryl with adequate closure and adequate hemostasis. All instruments were then removed from the vagina. A foley catheter was placed and the vagina was packed with gauze with estrogen cream.  The patient tolerated the procedure well and was taken to the recovery room in stable condition. Counts were correct, she received Ancef 2 gm prior to the procedure, she had PAS hose on throughout the procedure.

## 2016-05-02 ENCOUNTER — Encounter (HOSPITAL_COMMUNITY): Payer: Self-pay | Admitting: *Deleted

## 2016-05-02 DIAGNOSIS — N8110 Cystocele, unspecified: Secondary | ICD-10-CM | POA: Diagnosis not present

## 2016-05-02 LAB — CBC
HEMATOCRIT: 37.8 % (ref 36.0–46.0)
HEMOGLOBIN: 13 g/dL (ref 12.0–15.0)
MCH: 30 pg (ref 26.0–34.0)
MCHC: 34.4 g/dL (ref 30.0–36.0)
MCV: 87.1 fL (ref 78.0–100.0)
Platelets: 248 10*3/uL (ref 150–400)
RBC: 4.34 MIL/uL (ref 3.87–5.11)
RDW: 13.2 % (ref 11.5–15.5)
WBC: 11 10*3/uL — AB (ref 4.0–10.5)

## 2016-05-02 MED ORDER — OXYCODONE-ACETAMINOPHEN 5-325 MG PO TABS: 1.0000 | ORAL_TABLET | ORAL | 0 refills | Status: DC | PRN

## 2016-05-02 NOTE — Progress Notes (Signed)
1 Day Post-Op Procedure(s) (LRB): TRANSVAGINAL TAPE (TVT) PROCEDURE (N/A) ANTERIOR REPAIR (CYSTOCELE) (N/A)  Subjective: Patient reports incisional pain and tolerating PO.    Objective: I have reviewed patient's vital signs, intake and output and labs.  General: alert Vaginal Bleeding: minimal, packing and foley removed  Assessment: s/p Procedure(s): TRANSVAGINAL TAPE (TVT) PROCEDURE (N/A) ANTERIOR REPAIR (CYSTOCELE) (N/A): stable  Plan: Advance diet Encourage ambulation Discharge home  LOS: 0 days    Taniyah Ballow D 05/02/2016, 7:48 AM

## 2016-05-02 NOTE — Discharge Instructions (Signed)
Call with any problems voiding or for heavy vaginal bleeding

## 2016-05-02 NOTE — Progress Notes (Signed)
Pt out in wheelchair  Teaching complete  Voiding without residual

## 2016-05-07 NOTE — Discharge Summary (Signed)
Physician Discharge Summary  Patient ID: [name] MRN: [id number] DOB/AGE: 01-08-1986 30 y.o.  Admit date: 05/01/2016 Discharge date: 05/02/2016  Admission Diagnoses:  SUI, cystocele  Discharge Diagnoses: Same Active Problems:   Stress incontinence   Discharged Condition: good  Hospital Course: Pt admitted and underwent Solyx sling and anterior repair under spinal anesthesia, no complications.  She had no problems overnight, vaginal packing and foley catheter removed POD #1.  She was able to void without difficulty, no heavy bleeding, stable for discharge home.  Discharge Exam: Blood pressure 112/70, pulse (!) 59, temperature 97.8 F (36.6 C), temperature source Oral, resp. rate 20, height 5\' 2"  (1.575 m), weight 83.1 kg (183 lb 2 oz), SpO2 97 %. General appearance: alert  Disposition: 01-Home or Self Care  Discharge Instructions    Call MD for:  persistant nausea and vomiting    Complete by:  As directed   Call MD for:  severe uncontrolled pain    Complete by:  As directed   Call MD for:  temperature >100.4    Complete by:  As directed   Diet - low sodium heart healthy    Complete by:  As directed   Increase activity slowly    Complete by:  As directed   Lifting restrictions    Complete by:  As directed   10 lbs   Sexual Activity Restrictions    Complete by:  As directed   Pelvic rest       Medication List    TAKE these medications   albuterol 108 (90 Base) MCG/ACT inhaler Commonly known as:  PROVENTIL HFA;VENTOLIN HFA Inhale 2 puffs into the lungs every 6 (six) hours as needed for wheezing.   doxycycline 100 MG tablet Commonly known as:  VIBRA-TABS Take 1 tablet (100 mg total) by mouth 2 (two) times daily.   DULoxetine 30 MG capsule Commonly known as:  CYMBALTA Take 1 capsule (30 mg total) by mouth daily.   famotidine 10 MG tablet Commonly known as:  PEPCID Take 10 mg by mouth 2 (two) times daily.   fluconazole 150 MG tablet Commonly known as:   DIFLUCAN Take 150 mg by mouth once.   montelukast 10 MG tablet Commonly known as:  SINGULAIR Take 1 tablet (10 mg total) by mouth at bedtime.   ondansetron 4 MG tablet Commonly known as:  ZOFRAN Take 1 tablet (4 mg total) by mouth every 8 (eight) hours as needed for nausea or vomiting.   oxyCODONE-acetaminophen 5-325 MG tablet Commonly known as:  PERCOCET/ROXICET Take 1-2 tablets by mouth every 4 (four) hours as needed for severe pain (moderate to severe pain (when tolerating fluids)).   Spacer/Aero Chamber Mouthpiece Misc Please dispense one spacer for use with inhaler.      Follow-up Information    Pa Tennant D, MD. Schedule an appointment as soon as possible for a visit in 6 week(s).   Specialty:  Obstetrics and Gynecology Contact information: [address] [place] [address] [phone]           Signed: Clarene Duke 05/07/2016, 8:48 AM

## 2016-05-22 ENCOUNTER — Encounter: Payer: Self-pay | Admitting: Pediatrics

## 2016-05-22 ENCOUNTER — Ambulatory Visit: Payer: BLUE CROSS/BLUE SHIELD | Admitting: Pediatrics

## 2016-05-22 ENCOUNTER — Ambulatory Visit (INDEPENDENT_AMBULATORY_CARE_PROVIDER_SITE_OTHER): Payer: BLUE CROSS/BLUE SHIELD | Admitting: Pediatrics

## 2016-05-22 VITALS — BP 118/84 | HR 85 | Temp 97.3°F | Ht 62.0 in | Wt 183.0 lb

## 2016-05-22 DIAGNOSIS — E669 Obesity, unspecified: Secondary | ICD-10-CM | POA: Insufficient documentation

## 2016-05-22 DIAGNOSIS — F900 Attention-deficit hyperactivity disorder, predominantly inattentive type: Secondary | ICD-10-CM | POA: Diagnosis not present

## 2016-05-22 DIAGNOSIS — Z6833 Body mass index (BMI) 33.0-33.9, adult: Secondary | ICD-10-CM | POA: Diagnosis not present

## 2016-05-22 DIAGNOSIS — F411 Generalized anxiety disorder: Secondary | ICD-10-CM

## 2016-05-22 DIAGNOSIS — N393 Stress incontinence (female) (male): Secondary | ICD-10-CM | POA: Diagnosis not present

## 2016-05-22 DIAGNOSIS — E663 Overweight: Secondary | ICD-10-CM | POA: Insufficient documentation

## 2016-05-22 MED ORDER — METHYLPHENIDATE HCL ER (OSM) 36 MG PO TBCR: 36.0000 mg | EXTENDED_RELEASE_TABLET | Freq: Every day | ORAL | 0 refills | Status: DC

## 2016-05-22 MED ORDER — OMEPRAZOLE 20 MG PO CPDR: 20.0000 mg | DELAYED_RELEASE_CAPSULE | Freq: Every day | ORAL | 3 refills | Status: DC

## 2016-05-22 MED ORDER — LORATADINE 10 MG PO TABS: 10.0000 mg | ORAL_TABLET | Freq: Every day | ORAL | 11 refills | Status: DC

## 2016-05-22 MED ORDER — LISDEXAMFETAMINE DIMESYLATE 30 MG PO CAPS: 30.0000 mg | ORAL_CAPSULE | Freq: Every day | ORAL | 0 refills | Status: DC

## 2016-05-22 NOTE — Addendum Note (Signed)
Addended by: Eustaquio Maize on: 05/22/2016 11:22 AM   Modules accepted: Orders

## 2016-05-22 NOTE — Progress Notes (Addendum)
Subjective:   Patient ID: [name], female    DOB: [birth year], 30 y.o.   MRN: [id number] CC: Discuss possible ADHD  HPI: [name] is a 30 y.o. female presenting for Discuss possible ADHD  Has a hard time sitting still and reading, staying focused in school Nursing school now Hard time paying attention in school as a child, did poorly in school then, never diagnosed with ADHD Two of her kids with ADHD Having a hard time in school, grades not good on things she doesn't like. Now Bs, anything below 80% failing, was at 81% Often has high BP throughout day up to 140 seems to be related to anxiety School makes her anxious Was able to control anxiety before school started, mostly anxious now due to concern about grades and wanting to do better in school but having difficulty focusing Not on phenteramine anymore for weight loss No CP, no SOB, no HA   1. Fidgeting 3 2. Does not seem to listen to what is being said to him/her 3 3 .Doesn't pay attention to details; makes careless mistakes 1 4. Inattentative, easily distracted. 2 5. Has trouble organizing tasks or activities 2 6. Gives up easily on difficult tasks.3 7. Fidgets or squirms in seat 3 8. Restless or overactive 3 9. Is easily distracted by sights and sounds 1 10. Interrupts others 3  SCORE 24   Relevant past medical, surgical, family and social history reviewed. Allergies and medications reviewed and updated. History  Smoking Status  . Never Smoker  Smokeless Tobacco  . Never Used   ROS: Per HPI   Objective:    BP 118/84   Pulse 85   Temp 97.3 F (36.3 C) (Oral)   Ht 5\' 2"  (1.575 m)   Wt 183 lb (83 kg)   BMI 33.47 kg/m   Wt Readings from Last 3 Encounters:  05/22/16 183 lb (83 kg)  05/01/16 183 lb 2 oz (83.1 kg)  04/17/16 183 lb 2 oz (83.1 kg)    Gen: NAD, alert, cooperative with exam, NCAT EYES: EOMI, no conjunctival injection, or no icterus CV: NRRR, normal S1/S2, no murmur, distal pulses 2+  b/l Resp: CTABL, no wheezes, normal WOB Ext: No edema, warm Neuro: Alert and oriented, strength equal b/l UE and LE, coordination grossly normal MSK: normal muscle bulk Psych: nl affect  Assessment & Plan:  Ilona was seen today for discuss possible adhd.  Diagnoses and all orders for this visit:  Attention deficit hyperactivity disorder (ADHD), predominantly inattentive type Discussed start of long acting med for treatment May need increased dose Next visit will get utox Discussed contract, will have her sign next visit Vyvanse not covered by insurance, will do concerta 36mg  #30 tabs Rx given in exchange for vyvanse Rx  Generalized anxiety disorder Stop cymbalta for now, not helping with anxiety and making her feel worse at times Will treat ADHD, see if helps with anxiety as all of anxiety now is centered around school Will let me know if any symptoms worsen Consider TCA next visit if still with anxiety symptoms No thoughts of self harm Mood is fine  Stress incontinence Improved since surgery  Obesity, Class I, BMI 30-34.9 BMI 33.0-33.9,adult Wants to get back to exercising more She thinks the SSRIs she ahs recently been on have been making it weight trouble worse Not able to exercise until sept 13 given recent surgery  Follow up plan: Return in about 4 weeks (around 06/19/2016) for ADHD f/u. Assunta Found, MD Tr[facility] Schro[facility]  [place]

## 2016-06-12 ENCOUNTER — Ambulatory Visit (INDEPENDENT_AMBULATORY_CARE_PROVIDER_SITE_OTHER): Payer: BLUE CROSS/BLUE SHIELD | Admitting: Pediatrics

## 2016-06-12 ENCOUNTER — Telehealth: Payer: Self-pay | Admitting: Pediatrics

## 2016-06-12 ENCOUNTER — Encounter: Payer: Self-pay | Admitting: Pediatrics

## 2016-06-12 VITALS — BP 112/82 | HR 86 | Temp 97.7°F | Ht 62.0 in | Wt 184.2 lb

## 2016-06-12 DIAGNOSIS — Q829 Congenital malformation of skin, unspecified: Secondary | ICD-10-CM

## 2016-06-12 DIAGNOSIS — R609 Edema, unspecified: Secondary | ICD-10-CM

## 2016-06-12 DIAGNOSIS — F900 Attention-deficit hyperactivity disorder, predominantly inattentive type: Secondary | ICD-10-CM

## 2016-06-12 DIAGNOSIS — L858 Other specified epidermal thickening: Secondary | ICD-10-CM

## 2016-06-12 DIAGNOSIS — F411 Generalized anxiety disorder: Secondary | ICD-10-CM

## 2016-06-12 LAB — URINALYSIS, COMPLETE
Bilirubin, UA: NEGATIVE
GLUCOSE, UA: NEGATIVE
Ketones, UA: NEGATIVE
Leukocytes, UA: NEGATIVE
Nitrite, UA: NEGATIVE
PH UA: 7.5 (ref 5.0–7.5)
PROTEIN UA: NEGATIVE
RBC, UA: NEGATIVE
Specific Gravity, UA: 1.015 (ref 1.005–1.030)
Urobilinogen, Ur: 0.2 mg/dL (ref 0.2–1.0)

## 2016-06-12 LAB — MICROSCOPIC EXAMINATION: RBC, UA: NONE SEEN /hpf (ref 0–?)

## 2016-06-12 MED ORDER — METHYLPHENIDATE HCL ER (OSM) 54 MG PO TBCR: 54.0000 mg | EXTENDED_RELEASE_TABLET | Freq: Every day | ORAL | 0 refills | Status: DC

## 2016-06-12 MED ORDER — CITALOPRAM HYDROBROMIDE 20 MG PO TABS: 20.0000 mg | ORAL_TABLET | Freq: Every day | ORAL | 3 refills | Status: DC

## 2016-06-12 MED ORDER — TRIAMCINOLONE ACETONIDE 0.025 % EX OINT: 1.0000 "application " | TOPICAL_OINTMENT | Freq: Two times a day (BID) | CUTANEOUS | 0 refills | Status: DC

## 2016-06-12 NOTE — Patient Instructions (Signed)
When you start the celexa, take half a pill for 8 days, then start full pill

## 2016-06-12 NOTE — Progress Notes (Signed)
  Subjective:   Patient ID: [name], female    DOB: May 28, 1986, 30 y.o.   MRN: [id number] CC: ADHD (follow up)  HPI: [name] is a 30 y.o. female presenting for ADHD (follow up)  Started the concerrta Felt swimmyheaded/lightheaded first week, went away after that Now sometimes works, sometimes doesn't Sometimes definitley helps with focus  Takes when she wakes up No heart palpitations, no shaking or tremors  Stopped cymbalta the first week was starting concerta She thinks is definitely helping but somedays still with difficulty concentrating Sometimes harder at night Sometimes more irritable, she isnt sure if concerta related or because stopped the cymbalta  Also with rash upper arms b/l Has tried some OTC moisturizers with acid in them, causes irritation   Has noticed her watch fitting tighter, sometimes has swelling in feet Ongoing past 3 days off and on Still present someitme sin the morning Has h/o POTs, eats a lot of walt to maintain BP No recent symptoms from POTs  Relevant past medical, surgical, family and social history reviewed. Allergies and medications reviewed and updated. History  Smoking Status  . Never Smoker  Smokeless Tobacco  . Never Used   ROS: Per HPI   Objective:    BP 112/82   Pulse 86   Temp 97.7 F (36.5 C) (Oral)   Ht _0  (1.575 m)   Wt 184 lb 3.2 oz (83.6 kg)   BMI 33.69 kg/m   Wt Readings from Last 3 Encounters:  06/12/16 184 lb 3.2 oz (83.6 kg)  05/22/16 183 lb (83 kg)  05/01/16 183 lb 2 oz (83.1 kg)    Gen: NAD, alert, cooperative with exam, NCAT EYES: EOMI, no conjunctival injection, or no icterus CV: NRRR, normal S1/S2, no murmur, distal pulses 2+ b/l Resp: CTABL, no wheezes, normal WOB Ext: No pitting edema, warm Neuro: Alert and oriented Skin: upper arms b/l with small papules, some inflamed, some exoriations b/l, also with scattered slightly pink papules extensor surface b/l lower arms Psych: normal affect, no  thoughts of self harm  Assessment & Plan:  Coty was seen today for adhd, med problem f/u  Diagnoses and all orders for this visit:  Generalized anxiety disorder Start celexa -     citalopram (CELEXA) 20 MG tablet; Take 1 tablet (20 mg total) by mouth daily.  Attention deficit hyperactivity disorder (ADHD), predominantly inattentive type Increase to 14m daily Gave one Rx for #30 tab -     methylphenidate 54 MG PO CR tablet; Take 1 tablet (54 mg total) by mouth daily.  Swelling Likely due to increased salt intake, check labs below, try derease salt intake, if POTS symptoms return, let me know -     Urinalysis, Complete -     CMP14+EGFR  Keratosis pilaris Moisturizer BID, trial corticosteroid as does appear slightly inflamed upper arms -     triamcinolone (KENALOG) 0.025 % ointment; Apply 1 application topically 2 (two) times daily.   Follow up plan: Return in about 4 weeks (around 07/10/2016). CAssunta Found MD [facility]

## 2016-06-13 ENCOUNTER — Other Ambulatory Visit: Payer: Self-pay | Admitting: Pediatrics

## 2016-06-13 DIAGNOSIS — F900 Attention-deficit hyperactivity disorder, predominantly inattentive type: Secondary | ICD-10-CM

## 2016-06-13 LAB — CMP14+EGFR
ALBUMIN: 4.5 g/dL (ref 3.5–5.5)
ALT: 26 IU/L (ref 0–32)
AST: 17 IU/L (ref 0–40)
Albumin/Globulin Ratio: 1.7 (ref 1.2–2.2)
Alkaline Phosphatase: 66 IU/L (ref 39–117)
BUN / CREAT RATIO: 9 (ref 9–23)
BUN: 8 mg/dL (ref 6–20)
Bilirubin Total: 0.4 mg/dL (ref 0.0–1.2)
CALCIUM: 9.2 mg/dL (ref 8.7–10.2)
CO2: 26 mmol/L (ref 18–29)
CREATININE: 0.91 mg/dL (ref 0.57–1.00)
Chloride: 101 mmol/L (ref 96–106)
GFR calc Af Amer: 99 mL/min/{1.73_m2} (ref >59)
GFR, EST NON AFRICAN AMERICAN: 86 mL/min/{1.73_m2} (ref >59)
GLOBULIN, TOTAL: 2.7 g/dL (ref 1.5–4.5)
Glucose: 91 mg/dL (ref 65–99)
Potassium: 4.1 mmol/L (ref 3.5–5.2)
SODIUM: 143 mmol/L (ref 134–144)
Total Protein: 7.2 g/dL (ref 6.0–8.5)

## 2016-06-13 MED ORDER — METHYLPHENIDATE HCL ER (OSM) 36 MG PO TBCR: 36.0000 mg | EXTENDED_RELEASE_TABLET | Freq: Every day | ORAL | 0 refills | Status: DC

## 2016-06-13 NOTE — Telephone Encounter (Signed)
Lets continue with the same dose 36mg  then. I will print out new Rx, she needs to exchange one given yesterday to get this one. It will be the same medication she was on last month. We will keep the ADHD meds the same, try the other medication changes as we discussed, she should let m eknow how her symptoms are doing.

## 2016-06-13 NOTE — Telephone Encounter (Signed)
Per pt, pharmacy was charging cash price Per pt , medication should be covered at current dose by [employer]. She will call back if problem

## 2016-07-10 ENCOUNTER — Ambulatory Visit (INDEPENDENT_AMBULATORY_CARE_PROVIDER_SITE_OTHER): Payer: BLUE CROSS/BLUE SHIELD | Admitting: Pediatrics

## 2016-07-10 ENCOUNTER — Encounter: Payer: Self-pay | Admitting: Pediatrics

## 2016-07-10 VITALS — BP 126/87 | HR 91 | Temp 97.4°F | Ht 62.0 in | Wt 184.4 lb

## 2016-07-10 DIAGNOSIS — F4322 Adjustment disorder with anxiety: Secondary | ICD-10-CM | POA: Diagnosis not present

## 2016-07-10 DIAGNOSIS — F900 Attention-deficit hyperactivity disorder, predominantly inattentive type: Secondary | ICD-10-CM | POA: Diagnosis not present

## 2016-07-10 MED ORDER — HYDROXYZINE HCL 10 MG PO TABS: 10.0000 mg | ORAL_TABLET | Freq: Three times a day (TID) | ORAL | 2 refills | Status: DC | PRN

## 2016-07-10 MED ORDER — METHYLPHENIDATE HCL ER (OSM) 54 MG PO TBCR: 54.0000 mg | EXTENDED_RELEASE_TABLET | Freq: Every day | ORAL | 0 refills | Status: DC

## 2016-07-10 NOTE — Progress Notes (Signed)
  Subjective:   Patient ID: [name], female    DOB: 10-19-1985, 30 y.o.   MRN: [id number] CC: Follow-up anxiety, ADHD  HPI: [name] is a 30 y.o. female presenting for Follow-up  ADHD: Currently taking concerta 54mg , was increased from 36mg  last visit Taking every day Does help, much better 54mg  than 36mg , doesn't have the ups and downs Still with some symptoms Can work on things, still getting distracted Ups and downs on increased dose has helped Stopped taking anxiety meds Feels fine without it No anxiety Mood has been better Wants to be evaluated by psychiatry for ADHD for med management. Thinks symptoms could be better controlled Has appt to see Letta Moynahan, Pauline Good  Grades have been good Sleeping habits--has been fine  Anxiety has been controlled off of anxiety meds Doesn't want to take any right now  Relevant past medical, surgical, family and social history reviewed. Allergies and medications reviewed and updated. History  Smoking Status  . Never Smoker  Smokeless Tobacco  . Never Used   ROS: Per HPI   Objective:    BP 126/87   Pulse 91   Temp 97.4 F (36.3 C) (Oral)   Ht 5\' 2"  (1.575 m)   Wt 184 lb 6.4 oz (83.6 kg)   BMI 33.73 kg/m   Wt Readings from Last 3 Encounters:  07/10/16 184 lb 6.4 oz (83.6 kg)  06/12/16 184 lb 3.2 oz (83.6 kg)  05/22/16 183 lb (83 kg)    Gen: NAD, alert, cooperative with exam, NCAT EYES: EOMI, no conjunctival injection, or no icterus CV: NRRR, normal S1/S2, no murmur, distal pulses 2+ b/l Resp: CTABL, no wheezes, normal WOB Ext: No edema, warm Neuro: Alert and oriented  Assessment & Plan:  [name] was seen today for follow-up ADHD, anxiety.  Diagnoses and all orders for this visit:  Adjustment disorder with anxious mood Anxiety improving with focus improving -     hydrOXYzine (ATARAX/VISTARIL) 10 MG tablet; Take 1 tablet (10 mg total) by mouth 3 (three) times daily as needed.  Attention deficit  hyperactivity disorder (ADHD), predominantly inattentive type Cont below Pt has f/u already scheduled with psychiatry Will let me knwo if needs adidtional Rx -     methylphenidate 54 MG PO CR tablet; Take 1 tablet (54 mg total) by mouth daily. DO NOT FILL until 07/16/2016   Follow up plan: prn Assunta Found, MD [place]

## 2016-08-31 ENCOUNTER — Encounter: Payer: Self-pay | Admitting: Family Medicine

## 2016-08-31 ENCOUNTER — Ambulatory Visit (INDEPENDENT_AMBULATORY_CARE_PROVIDER_SITE_OTHER): Payer: BLUE CROSS/BLUE SHIELD | Admitting: Family Medicine

## 2016-08-31 VITALS — BP 120/77 | HR 80 | Temp 97.2°F | Ht 62.0 in | Wt 176.0 lb

## 2016-08-31 DIAGNOSIS — B356 Tinea cruris: Secondary | ICD-10-CM

## 2016-08-31 MED ORDER — KETOCONAZOLE 2 % EX CREA: 1.0000 "application " | TOPICAL_CREAM | Freq: Every day | CUTANEOUS | 1 refills | Status: DC

## 2016-08-31 NOTE — Patient Instructions (Signed)
Great to see you!  Try ketoconazole cream 1-2 times daily for a few days until this resolves, drying powders can be helpful as well for long shifts.

## 2016-08-31 NOTE — Progress Notes (Signed)
   HPI  Patient presents today here with rash.  Patient explains over the last 2 weeks or so she's had intermittent worsening groin rash. 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 She changed soaps because of body odor and then developed this itchy rash. She states it's better in the morning and worse by the end of the day after she's been sweaty for a few hours. At times it's very itchy.  She has no areas of tenderness or concerns for infection.  Patient has tried hydrocortisone cream without much improvement. She also complains of a similar type itchy area on the medial border of her left breast.  PMH: Smoking status noted ROS: Per HPI  Objective: BP 120/77   Pulse 80   Temp 97.2 F (36.2 C) (Oral)   Ht 5\' 2"  (1.575 m)   Wt 176 lb (79.8 kg)   BMI 32.19 kg/m  Gen: NAD, alert, cooperative with exam HEENT: NCAT Ext: No edema, warm Neuro: Alert and oriented, No gross deficits  Skin:  Medial L breast with very faint erythema in the intertriginous medial border between breasts  Exam performed with Antonietta Barcelona, RN present  L groin with slight erythema and small circular area with central clearing meas approx 2-3 cm in diameter, small satellite lesions, R groin with few small lesions.  Pt did not completely undress so the rest of the perineum was not examined.   Assessment and plan:  # Tinea cruris, intertrigo Discussed usual course of illness with patient Ketoconazole cream Discussed drying powders and change of clothes prior to exercise or with long shifts. Return to clinic with any concerns or failure to improve.  She also discussed in blurry vision. She states that it's mostly blurry at the end of the day, at times during the day it's blurry when she is using rise a lot. She has been diagnosed with dry eyes and given medication from ophthalmology, she is not using this medication. She wears contacts She  reports normal vision overall. Consider artificial tears, she did not tolerate the medication for dry eyes. She was wondering if this was due to BuSpar, after discussing with her it's difficult to associate with actually taking the medication, it is a listed possible side effect   Meds ordered this encounter  Medications  . busPIRone (BUSPAR) 10 MG tablet    Sig: Take 1 tablet by mouth 2 (two) times daily.    Refill:  0  . ketoconazole (NIZORAL) 2 % cream    Sig: Apply 1 application topically daily.    Dispense:  60 g    Refill:  [place], MD [facility] 08/31/2016, 11:52 AM

## 2016-09-17 ENCOUNTER — Telehealth: Payer: Self-pay | Admitting: Pediatrics

## 2016-09-17 MED ORDER — BUSPIRONE HCL 10 MG PO TABS: 10.0000 mg | ORAL_TABLET | Freq: Two times a day (BID) | ORAL | 3 refills | Status: DC

## 2016-09-17 MED ORDER — OMEPRAZOLE 20 MG PO CPDR: 20.0000 mg | DELAYED_RELEASE_CAPSULE | Freq: Every day | ORAL | 3 refills | Status: DC

## 2016-09-17 NOTE — Telephone Encounter (Signed)
Pt was started on Buspar by Pauline Good  She no longer sees her she is seeing a psychologist strictly for ADHD who will not prescribe Buspar Can pt get a refill or does she NTBS Also need refill of Omeprazole Please review and advise

## 2016-09-17 NOTE — Telephone Encounter (Signed)
Pt notifed of RXs

## 2016-09-17 NOTE — Telephone Encounter (Signed)
OK to refill both for 30 days with 3 refills

## 2016-10-24 ENCOUNTER — Telehealth: Payer: Self-pay | Admitting: Pediatrics

## 2016-10-24 NOTE — Telephone Encounter (Signed)
Patient called stating that she has seen a ADHD doctor in [place].  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 ADHD doctor would like for her to see her PCP to do further labs. Patient was prescribe Deplin.  Patient aware that she would need to be seen.

## 2016-10-24 NOTE — Telephone Encounter (Signed)
I have the records from her first visit with [facility] in 08/2016, it does not include any lab work or mention of getting labs done. Can we get the other records or if she has them can she bring them in to her next appointment? Can discuss then.

## 2016-10-25 ENCOUNTER — Encounter: Payer: Self-pay | Admitting: Pediatrics

## 2016-10-25 ENCOUNTER — Ambulatory Visit (INDEPENDENT_AMBULATORY_CARE_PROVIDER_SITE_OTHER): Payer: BLUE CROSS/BLUE SHIELD | Admitting: Pediatrics

## 2016-10-25 VITALS — BP 116/78 | HR 70 | Temp 97.3°F | Ht 62.0 in | Wt 170.8 lb

## 2016-10-25 DIAGNOSIS — F32A Depression, unspecified: Secondary | ICD-10-CM

## 2016-10-25 DIAGNOSIS — F900 Attention-deficit hyperactivity disorder, predominantly inattentive type: Secondary | ICD-10-CM

## 2016-10-25 DIAGNOSIS — F411 Generalized anxiety disorder: Secondary | ICD-10-CM | POA: Diagnosis not present

## 2016-10-25 DIAGNOSIS — F329 Major depressive disorder, single episode, unspecified: Secondary | ICD-10-CM | POA: Diagnosis not present

## 2016-10-25 NOTE — Progress Notes (Signed)
  Subjective:   Patient ID: [name], female    DOB: 1986/02/21, 31 y.o.   MRN: [id number] CC: Discuss Medication  HPI: [name] is a 31 y.o. female presenting for Discuss Medication  Appetite down Past week has had shorter fuse Decreased appetite Recent increase in vyvanse dose  Seen by [facility] Had genetic testing to show what medications her body responds best to Brought test results with her Shows she is a slow metabolizer of certain medications May have decreased activity of some enzymes Interested in having vitamin levels drawn to see if she needs replacement folate per the genetic testing Shows being a rapid metabolizer of concerta which she was tried on initially and did not do well with  Relevant past medical, surgical, family and social history reviewed. Allergies and medications reviewed and updated. History  Smoking Status  . Never Smoker  Smokeless Tobacco  . Never Used   ROS: Per HPI   Objective:    BP 116/78   Pulse 70   Temp 97.3 F (36.3 C) (Oral)   Ht 5\' 2"  (1.575 m)   Wt 170 lb 12.8 oz (77.5 kg)   BMI 31.24 kg/m   Wt Readings from Last 3 Encounters:  10/25/16 170 lb 12.8 oz (77.5 kg)  08/31/16 176 lb (79.8 kg)  07/10/16 184 lb 6.4 oz (83.6 kg)    Gen: NAD, alert, cooperative with exam, NCAT EYES: EOMI, no conjunctival injection, or no icterus CV: WWP Resp: normal WOB Neuro: Alert and oriented MSK: normal muscle bulk  Assessment & Plan:  [name] was seen today for discuss medication.  Diagnoses and all orders for this visit:  Depression, unspecified depression type Does not want to start medication now SSRIs that she was previously on are on the list she she does not respond to per the genetic testing, would try alternate in future if needed Check below levels -     Folate -     Vitamin B12 -     Methylmalonic acid, serum -     Homocysteine  Attention deficit hyperactivity disorder (ADHD), predominantly  inattentive type [facility] with [facility]  Generalized anxiety disorder Ongoing symptoms, does not want to try SSRI now  Follow up plan: 6 mo, sooner prn Assunta Found, MD [place]

## 2016-10-29 LAB — METHYLMALONIC ACID, SERUM: METHYLMALONIC ACID: 87 nmol/L (ref 0–378)

## 2016-10-29 LAB — VITAMIN B12: VITAMIN B 12: 388 pg/mL (ref 232–1245)

## 2016-10-29 LAB — HOMOCYSTEINE: HOMOCYSTEINE: 7.6 umol/L (ref 0.0–15.0)

## 2016-10-29 LAB — FOLATE: FOLATE: 8.4 ng/mL (ref >3.0)

## 2017-02-12 ENCOUNTER — Encounter: Payer: Self-pay | Admitting: Pediatrics

## 2017-02-12 ENCOUNTER — Ambulatory Visit (INDEPENDENT_AMBULATORY_CARE_PROVIDER_SITE_OTHER): Payer: BLUE CROSS/BLUE SHIELD | Admitting: Pediatrics

## 2017-02-12 VITALS — BP 129/88 | HR 94 | Temp 97.7°F | Ht 62.0 in | Wt 160.2 lb

## 2017-02-12 DIAGNOSIS — R1013 Epigastric pain: Secondary | ICD-10-CM | POA: Diagnosis not present

## 2017-02-12 DIAGNOSIS — F909 Attention-deficit hyperactivity disorder, unspecified type: Secondary | ICD-10-CM | POA: Diagnosis not present

## 2017-02-12 DIAGNOSIS — F419 Anxiety disorder, unspecified: Secondary | ICD-10-CM

## 2017-02-12 DIAGNOSIS — G43809 Other migraine, not intractable, without status migrainosus: Secondary | ICD-10-CM

## 2017-02-12 MED ORDER — RIZATRIPTAN BENZOATE 10 MG PO TBDP: 10.0000 mg | ORAL_TABLET | ORAL | 0 refills | Status: DC | PRN

## 2017-02-12 MED ORDER — OMEPRAZOLE 20 MG PO CPDR: 20.0000 mg | DELAYED_RELEASE_CAPSULE | Freq: Every day | ORAL | 0 refills | Status: DC

## 2017-02-12 MED ORDER — BUSPIRONE HCL 10 MG PO TABS: 10.0000 mg | ORAL_TABLET | Freq: Two times a day (BID) | ORAL | 3 refills | Status: DC

## 2017-02-12 MED ORDER — ONDANSETRON 4 MG PO TBDP: 4.0000 mg | ORAL_TABLET | Freq: Three times a day (TID) | ORAL | 0 refills | Status: DC | PRN

## 2017-02-12 NOTE — Progress Notes (Signed)
  Subjective:   Patient ID: [name], female    DOB: 03-30-1986, 31 y.o.   MRN: [id number] CC: Medication Refill (Vyvanse, can not get in with specialist until end of june); Abdominal Pain (Stopped taking Prilosec ); and Migraine (started this Am)  HPI: [name] is a 31 y.o. female presenting for Medication Refill (Vyvanse, can not get in with specialist until end of june); Abdominal Pain (Stopped taking Prilosec ); and Migraine (started this Am)  Migraine started this morning Has been a while since she last had a migraine Now off of prilosec No further symptoms Takes pepcid prn  Started having L sided tingling, L sided blurriness this morning This is what happens with her migraines Started having migraines 10 yrs ago Got better after last pregnancy 5 yrs ago, only a couple with auras like this one  Taking vyvanse for ADHD meds School just finished, missed an appt with Dr. Johnnye Sima who has been prescribing her medication Has another appt in 2 mo, has 2 weeks left of medication  Relevant past medical, surgical, family and social history reviewed. Allergies and medications reviewed and updated. History  Smoking Status  . Never Smoker  Smokeless Tobacco  . Never Used   ROS: Per HPI   Objective:    BP 129/88   Pulse 94   Temp 97.7 F (36.5 C) (Oral)   Ht 5\' 2"  (1.575 m)   Wt 160 lb 3.2 oz (72.7 kg)   BMI 29.30 kg/m   Wt Readings from Last 3 Encounters:  02/12/17 160 lb 3.2 oz (72.7 kg)  10/25/16 170 lb 12.8 oz (77.5 kg)  08/31/16 176 lb (79.8 kg)    Gen: NAD, alert, cooperative with exam, NCAT EYES: EOMI, PERRL, no conjunctival injection, or no icterus ENT:  OP without erythema LYMPH: no cervical LAD CV: NRRR, normal S1/S2, no murmur, distal pulses 2+ b/l Resp: CTABL, no wheezes, normal WOB Ext: No edema, warm Neuro: Alert and oriented, strength equal b/l UE and LE, sensation intact b/l UE, LE, CN III-XII intact. coordination grossly normal  Assessment &  Plan:  Nealie was seen today for medication refill, abdominal pain and migraine.  Diagnoses and all orders for this visit:  Other migraine without status migrainosus, not intractable Similar to prior HA, start below Not on ppx now If not improving, any worsening, needs to be seen -     rizatriptan (MAXALT-MLT) 10 MG disintegrating tablet; Take 1 tablet (10 mg total) by mouth as needed for migraine. May repeat in 2 hours if needed -     ondansetron (ZOFRAN-ODT) 4 MG disintegrating tablet; Take 1 tablet (4 mg total) by mouth every 8 (eight) hours as needed for nausea or vomiting.  Anxiety Stable, cont below -     busPIRone (BUSPAR) 10 MG tablet; Take 1 tablet (10 mg total) by mouth 2 (two) times daily.  Epigastric pain Below improving pain, cont for now -     omeprazole (PRILOSEC) 20 MG capsule; Take 1 capsule (20 mg total) by mouth daily.  Attention deficit hyperactivity disorder (ADHD), unspecified ADHD type Stable, on vyvnase Pt to contact physician following her for ADHD, see if able to get in sooner.  Follow up plan: Return if symptoms worsen or fail to improve. Assunta Found, MD [place]

## 2017-05-05 DIAGNOSIS — R338 Other retention of urine: Secondary | ICD-10-CM | POA: Diagnosis not present

## 2017-05-05 DIAGNOSIS — Z30431 Encounter for routine checking of intrauterine contraceptive device: Secondary | ICD-10-CM | POA: Diagnosis not present

## 2017-06-04 ENCOUNTER — Other Ambulatory Visit: Payer: Self-pay | Admitting: Family Medicine

## 2017-06-04 ENCOUNTER — Ambulatory Visit (INDEPENDENT_AMBULATORY_CARE_PROVIDER_SITE_OTHER): Payer: BLUE CROSS/BLUE SHIELD

## 2017-06-04 ENCOUNTER — Ambulatory Visit (INDEPENDENT_AMBULATORY_CARE_PROVIDER_SITE_OTHER): Payer: 59 | Admitting: Family Medicine

## 2017-06-04 ENCOUNTER — Encounter: Payer: Self-pay | Admitting: Family Medicine

## 2017-06-04 VITALS — BP 121/83 | HR 95 | Temp 97.8°F | Ht 62.0 in | Wt 153.0 lb

## 2017-06-04 DIAGNOSIS — M79643 Pain in unspecified hand: Secondary | ICD-10-CM

## 2017-06-04 DIAGNOSIS — R1011 Right upper quadrant pain: Secondary | ICD-10-CM | POA: Diagnosis not present

## 2017-06-04 DIAGNOSIS — M79641 Pain in right hand: Secondary | ICD-10-CM

## 2017-06-04 DIAGNOSIS — R109 Unspecified abdominal pain: Secondary | ICD-10-CM

## 2017-06-04 DIAGNOSIS — M2550 Pain in unspecified joint: Secondary | ICD-10-CM

## 2017-06-04 DIAGNOSIS — M79642 Pain in left hand: Secondary | ICD-10-CM

## 2017-06-04 DIAGNOSIS — G8929 Other chronic pain: Secondary | ICD-10-CM

## 2017-06-04 MED FILL — VYVANSE 40 MG CAPSULE: 40 | 30 days supply | Qty: 30 | Fill #0

## 2017-06-04 NOTE — Progress Notes (Signed)
Subjective:  Patient ID: [name], female    DOB: September 08, 1986  Age: 31 y.o. MRN: [id number]  CC: Abdominal Pain (pt here today c/o stomach pains on the right side under her rib cage that sometimes radiates to her mid back and she also is c/o rash on her legs and arms that recently popped up)   HPI [name] presents for Abdominal pain limited to the right upper quadrant. It radiates into the back she points to the angle of the scapula. It is increased by eating. She does not distinguish between foods. No particular belching noted. She's not had any constipation or diarrhea.This is been ongoing for to 3 weeks.  Patient is also concerned that her hands have had a fine tremor. She has some pain in the index fingers and nodules at the PIPs. Pain does not involve other fingers and there are no other nodules. She takes BuSpar, she cut back to 5 mg twice a day 4 days ago to see if that would help with the tremor.  Depression screen [facility] 2/9 06/04/2017 10/25/2016 08/31/2016  Decreased Interest 0 0 0  Down, Depressed, Hopeless 0 0 0  PHQ - 2 Score 0 0 0  Altered sleeping - - -  Tired, decreased energy - - -  Change in appetite - - -  Feeling bad or failure about yourself  - - -  Trouble concentrating - - -  Moving slowly or fidgety/restless - - -  Suicidal thoughts - - -  PHQ-9 Score - - -  Difficult doing work/chores - - -    History [name] has a past medical history of Anxiety; Asthma; Concussion; Depression; GERD (gastroesophageal reflux disease); Headache(784.0); Herpes; Keratosis; Kidney stone; Nephrolithiasis; PONV (postoperative nausea and vomiting); Postural orthostatic tachycardia syndrome; and Tingling in extremities.   She has a past surgical history that includes No past surgeries; Breast enhancement surgery; Bladder suspension (N/A, 05/01/2016); and Cystocele repair (N/A, 05/01/2016).   Her family history includes Alcohol abuse in her father, maternal grandfather, and paternal  grandfather; Arthritis in her maternal aunt and maternal grandmother; COPD in her maternal grandmother; Cancer in her maternal grandfather and paternal grandmother; Diabetes in her father; Stroke in her paternal grandfather; Thyroid disease in her maternal grandmother.She reports that she has never smoked. She has never used smokeless tobacco. She reports that she does not drink alcohol or use drugs.    ROS Review of Systems  Constitutional: Negative for activity change, appetite change and fever.  HENT: Negative for congestion, rhinorrhea and sore throat.   Eyes: Negative for visual disturbance.  Respiratory: Negative for cough and shortness of breath.   Cardiovascular: Negative for chest pain and palpitations.  Gastrointestinal: Positive for abdominal pain. Negative for diarrhea and nausea.  Genitourinary: Positive for flank pain. Negative for dysuria.  Musculoskeletal: Positive for arthralgias. Negative for myalgias.    Objective:  BP 121/83   Pulse 95   Temp 97.8 F (36.6 C) (Oral)   Ht 5' 2"  (1.575 m)   Wt 153 lb (69.4 kg)   BMI 27.98 kg/m   BP Readings from Last 3 Encounters:  06/04/17 121/83  02/12/17 129/88  10/25/16 116/78    Wt Readings from Last 3 Encounters:  06/04/17 153 lb (69.4 kg)  02/12/17 160 lb 3.2 oz (72.7 kg)  10/25/16 170 lb 12.8 oz (77.5 kg)     Physical Exam  Constitutional: She is oriented to person, place, and time. She appears well-developed and well-nourished.  HENT:  Head:  Normocephalic and atraumatic.  Cardiovascular: Normal rate and regular rhythm.   No murmur heard. Pulmonary/Chest: Effort normal and breath sounds normal.  Abdominal: Soft. Bowel sounds are normal. She exhibits no mass. There is tenderness (RUQ. Negative Murphy sign.). There is no rebound and no guarding.  Musculoskeletal: Normal range of motion. She exhibits no edema, tenderness or deformity.  Neurological: She is alert and oriented to person, place, and time.  Skin:  Skin is warm and dry.  Psychiatric: She has a normal mood and affect. Her behavior is normal.      Assessment & Plan:   [name] was seen today for abdominal pain.  Diagnoses and all orders for this visit:  RUQ pain -     CBC with Differential/Platelet -     CMP14+EGFR -     Lipase -     Cancel: US Abdomen Limited RUQ; Future  Pain of hand, unspecified laterality -     Rheumatoid factor -     Cancel: DG Hand Complete Left; Future -     Cancel: DG Hand Complete Right; Future       I have discontinued [name]'s omeprazole. I am also having her maintain her VYVANSE, rizatriptan, ondansetron, busPIRone, albuterol, and levonorgestrel.  Allergies as of 06/04/2017      Reactions   Prozac [fluoxetine Hcl] Nausea Only   Latex Rash      Medication List       Accurate as of 06/04/17  5:03 PM. Always use your most recent med list.          busPIRone 10 MG tablet Commonly known as:  BUSPAR Take 1 tablet (10 mg total) by mouth 2 (two) times daily.   MIRENA (52 MG) 20 MCG/24HR IUD Generic drug:  levonorgestrel Mirena 20 mcg/24 hr (5 years) intrauterine device  Take 1 insert by intrauterine route.   ondansetron 4 MG disintegrating tablet Commonly known as:  ZOFRAN-ODT Take 1 tablet (4 mg total) by mouth every 8 (eight) hours as needed for nausea or vomiting.   PROAIR HFA 108 (90 Base) MCG/ACT inhaler Generic drug:  albuterol ProAir HFA 90 mcg/actuation aerosol inhaler   rizatriptan 10 MG disintegrating tablet Commonly known as:  MAXALT-MLT Take 1 tablet (10 mg total) by mouth as needed for migraine. May repeat in 2 hours if needed   VYVANSE 40 MG capsule Generic drug:  lisdexamfetamine            Discharge Care Instructions        Start     Ordered   06/04/17 0000  CBC with Differential/Platelet     06/04/17 1622   06/04/17 0000  CMP14+EGFR     06/04/17 1622   06/04/17 0000  Lipase     06/04/17 1622   06/04/17 0000  Rheumatoid factor     06/04/17 1622         Follow-up: Return in about 2 weeks (around 06/18/2017), or if symptoms worsen or fail to improve, for with Dr Evette Doffing.  Claretta Fraise, M.D.

## 2017-06-05 ENCOUNTER — Telehealth: Payer: Self-pay | Admitting: Pediatrics

## 2017-06-05 LAB — CBC WITH DIFFERENTIAL/PLATELET
Basophils Absolute: 0.1 10*3/uL (ref 0.0–0.2)
Basos: 1 %
EOS (ABSOLUTE): 0.2 10*3/uL (ref 0.0–0.4)
EOS: 1 %
HEMATOCRIT: 46.1 % (ref 34.0–46.6)
HEMOGLOBIN: 15.5 g/dL (ref 11.1–15.9)
IMMATURE GRANS (ABS): 0 10*3/uL (ref 0.0–0.1)
IMMATURE GRANULOCYTES: 0 %
LYMPHS: 24 %
Lymphocytes Absolute: 2.6 10*3/uL (ref 0.7–3.1)
MCH: 29.8 pg (ref 26.6–33.0)
MCHC: 33.6 g/dL (ref 31.5–35.7)
MCV: 89 fL (ref 79–97)
MONOCYTES: 6 %
Monocytes Absolute: 0.6 10*3/uL (ref 0.1–0.9)
NEUTROS PCT: 68 %
Neutrophils Absolute: 7.3 10*3/uL — ABNORMAL HIGH (ref 1.4–7.0)
PLATELETS: 363 10*3/uL (ref 150–379)
RBC: 5.2 x10E6/uL (ref 3.77–5.28)
RDW: 12.8 % (ref 12.3–15.4)
WBC: 10.7 10*3/uL (ref 3.4–10.8)

## 2017-06-05 LAB — RHEUMATOID FACTOR: Rhuematoid fact SerPl-aCnc: <10 IU/mL (ref 0.0–13.9)

## 2017-06-05 LAB — CMP14+EGFR
A/G RATIO: 1.8 (ref 1.2–2.2)
ALBUMIN: 4.6 g/dL (ref 3.5–5.5)
ALT: 16 IU/L (ref 0–32)
AST: 15 IU/L (ref 0–40)
Alkaline Phosphatase: 68 IU/L (ref 39–117)
BUN / CREAT RATIO: 10 (ref 9–23)
BUN: 9 mg/dL (ref 6–20)
Bilirubin Total: 0.6 mg/dL (ref 0.0–1.2)
CALCIUM: 9.3 mg/dL (ref 8.7–10.2)
CO2: 24 mmol/L (ref 20–29)
Chloride: 103 mmol/L (ref 96–106)
Creatinine, Ser: 0.92 mg/dL (ref 0.57–1.00)
GFR, EST AFRICAN AMERICAN: 97 mL/min/{1.73_m2} (ref >59)
GFR, EST NON AFRICAN AMERICAN: 84 mL/min/{1.73_m2} (ref >59)
Globulin, Total: 2.5 g/dL (ref 1.5–4.5)
Glucose: 96 mg/dL (ref 65–99)
POTASSIUM: 4.4 mmol/L (ref 3.5–5.2)
Sodium: 142 mmol/L (ref 134–144)
TOTAL PROTEIN: 7.1 g/dL (ref 6.0–8.5)

## 2017-06-05 LAB — LIPASE: Lipase: 25 U/L (ref 14–72)

## 2017-06-05 NOTE — Telephone Encounter (Signed)
Unable to reach patient. Voicemail full. Results released to Mychart

## 2017-06-06 ENCOUNTER — Ambulatory Visit (HOSPITAL_COMMUNITY)
Admission: RE | Admit: 2017-06-06 | Discharge: 2017-06-06 | Disposition: A | Discharge status: Home / Self Care | Payer: 59 | Source: Ambulatory Visit | Attending: Family Medicine | Admitting: Family Medicine

## 2017-06-06 DIAGNOSIS — R109 Unspecified abdominal pain: Secondary | ICD-10-CM

## 2017-06-06 DIAGNOSIS — R1011 Right upper quadrant pain: Secondary | ICD-10-CM | POA: Diagnosis not present

## 2017-07-03 DIAGNOSIS — I499 Cardiac arrhythmia, unspecified: Secondary | ICD-10-CM | POA: Diagnosis not present

## 2017-07-03 DIAGNOSIS — F902 Attention-deficit hyperactivity disorder, combined type: Secondary | ICD-10-CM | POA: Diagnosis not present

## 2017-07-03 DIAGNOSIS — Z79899 Other long term (current) drug therapy: Secondary | ICD-10-CM | POA: Diagnosis not present

## 2017-07-03 DIAGNOSIS — F419 Anxiety disorder, unspecified: Secondary | ICD-10-CM | POA: Diagnosis not present

## 2017-07-03 MED FILL — VYVANSE 40 MG CAPSULE: 40 | 30 days supply | Qty: 30 | Fill #0

## 2017-07-17 DIAGNOSIS — H52223 Regular astigmatism, bilateral: Secondary | ICD-10-CM | POA: Diagnosis not present

## 2017-07-17 DIAGNOSIS — H5203 Hypermetropia, bilateral: Secondary | ICD-10-CM | POA: Diagnosis not present

## 2017-07-28 ENCOUNTER — Encounter: Payer: Self-pay | Admitting: Pediatrics

## 2017-07-28 DIAGNOSIS — B009 Herpesviral infection, unspecified: Secondary | ICD-10-CM

## 2017-07-28 MED ORDER — VALACYCLOVIR HCL 500 MG PO TABS: ORAL_TABLET | ORAL | 1 refills | Status: DC

## 2017-07-29 ENCOUNTER — Telehealth: Payer: Self-pay | Admitting: Pediatrics

## 2017-07-29 DIAGNOSIS — B009 Herpesviral infection, unspecified: Secondary | ICD-10-CM

## 2017-07-29 MED ORDER — VALACYCLOVIR HCL 500 MG PO TABS: ORAL_TABLET | ORAL | 1 refills | Status: DC

## 2017-07-29 NOTE — Telephone Encounter (Signed)
RX changed to [place] Pt notified

## 2017-08-01 MED FILL — VYVANSE 40 MG CAPSULE: 40 | 30 days supply | Qty: 30 | Fill #0

## 2017-08-07 ENCOUNTER — Encounter: Payer: Self-pay | Admitting: Family

## 2017-08-07 ENCOUNTER — Ambulatory Visit (INDEPENDENT_AMBULATORY_CARE_PROVIDER_SITE_OTHER): Payer: 59 | Admitting: Family

## 2017-08-07 VITALS — BP 120/83 | HR 80 | Temp 98.2°F | Ht 62.0 in | Wt 155.8 lb

## 2017-08-07 DIAGNOSIS — H1013 Acute atopic conjunctivitis, bilateral: Secondary | ICD-10-CM

## 2017-08-07 DIAGNOSIS — F419 Anxiety disorder, unspecified: Secondary | ICD-10-CM

## 2017-08-07 MED ORDER — POLYMYXIN B-TRIMETHOPRIM 10000-0.1 UNIT/ML-% OP SOLN: 2.0000 [drp] | Freq: Four times a day (QID) | OPHTHALMIC | 0 refills | Status: DC

## 2017-08-07 MED ORDER — BUSPIRONE HCL 10 MG PO TABS: 10.0000 mg | ORAL_TABLET | Freq: Two times a day (BID) | ORAL | 3 refills | Status: DC

## 2017-08-07 NOTE — Patient Instructions (Signed)
Allergic Conjunctivitis A clear membrane (conjunctiva) covers the white part of your eye and the inner surface of your eyelid. Allergic conjunctivitis happens when this membrane has inflammation. This is caused by allergies. Common causes of allergic reactions (allergens)include:  Outdoor allergens, such as:  Pollen.  Grass and weeds.  Mold spores.  Indoor allergens, such as:  Dust.  Smoke.  Mold.  Pet dander.  Animal hair. This condition can make your eye red or pink. It can also make your eye feel itchy. This condition cannot be spread from one person to another person (is not contagious). Follow these instructions at home:  Try not to be around things that you are allergic to.  Take or apply over-the-counter and prescription medicines only as told by your doctor. These include any eye drops.  Place a cool, clean washcloth on your eye for 10-20 minutes. Do this 3-4 times a day.  Do not touch or rub your eyes.  Do not wear contact lenses until the inflammation is gone. Wear glasses instead.  Do not wear eye makeup until the inflammation is gone.  Keep all follow-up visits as told by your doctor. This is important. Contact a doctor if:  Your symptoms get worse.  Your symptoms do not get better with treatment.  You have mild eye pain.  You are sensitive to light,  You have spots or blisters on your eyes.  You have pus coming from your eye.  You have a fever. Get help right away if:  You have redness, swelling, or other symptoms in only one eye.  Your vision is blurry.  You have vision changes.  You have very bad eye pain. Summary  Allergic conjunctivitis is caused by allergies. It can make your eye red or pink, and it can make your eye feel itchy.  This condition cannot be spread from one person to another person (is not contagious).  Try not to be around things that you are allergic to.  Take or apply over-the-counter and prescription medicines  only as told by your doctor. These include any eye drops.  Contact your doctor if your symptoms get worse or they do not get better with treatment. This information is not intended to replace advice given to you by your health care provider. Make sure you discuss any questions you have with your health care provider. Document Released: 03/06/2010 Document Revised: 05/10/2016 Document Reviewed: 05/10/2016 Elsevier Interactive Patient Education  2017 Elsevier Inc.  

## 2017-08-07 NOTE — Progress Notes (Signed)
   Subjective:    Patient ID: [name], female    DOB: 05/08/1986, 31 y.o.   MRN: [id number]  Pt presents to the office today with eye drainage. 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 Conjunctivitis   The current episode started yesterday. The onset was gradual. The problem occurs frequently. The problem has been gradually worsening. The problem is mild. The symptoms are relieved by rest. Associated symptoms include photophobia, eye discharge, eye pain and eye redness. Pertinent negatives include no decreased vision, no double vision and no eye itching. The eye pain is mild. Both eyes are affected.     Review of Systems  Eyes: Positive for photophobia, pain, discharge and redness. Negative for double vision and itching.  All other systems reviewed and are negative.      Objective:   Physical Exam  Constitutional: She is oriented to person, place, and time. She appears well-developed and well-nourished. No distress.  HENT:  Head: Normocephalic and atraumatic.  Right Ear: External ear normal.  Nose: Nose normal.  Mouth/Throat: Oropharynx is clear and moist.  Bilateral eye lids mild swelling  Eyes: Pupils are equal, round, and reactive to light. Right eye exhibits no discharge. Left eye exhibits no discharge.  Neck: Normal range of motion. Neck supple. No thyromegaly present.  Cardiovascular: Normal rate, regular rhythm, normal heart sounds and intact distal pulses.  No murmur heard. Pulmonary/Chest: Effort normal and breath sounds normal. No respiratory distress. She has no wheezes.  Abdominal: Soft. Bowel sounds are normal. She exhibits no distension. There is no tenderness.  Musculoskeletal: Normal range of motion. She exhibits no edema or tenderness.  Neurological: She is alert and oriented to person, place, and time.  Skin: Skin is warm and dry.  Psychiatric: She has a normal mood and affect. Her behavior is normal. Judgment and thought content  normal.  Vitals reviewed.     BP 120/83   Pulse 80   Temp 98.2 F (36.8 C) (Oral)   Ht 5\' 2"  (1.575 m)   Wt 155 lb 12.8 oz (70.7 kg)   BMI 28.50 kg/m      Assessment & Plan:  1. Allergic conjunctivitis of both eyes PT to start zyrtec OTC eye drops Avoid allergens DO not rub eye   I believe this is allergy related, but will give Polytrim rx if eyes become worse since she was exposed.    [name], FNP

## 2017-09-05 MED FILL — VYVANSE 40 MG CAPSULE: 40 | 30 days supply | Qty: 30 | Fill #0

## 2017-09-12 ENCOUNTER — Encounter: Payer: Self-pay | Admitting: Pediatrics

## 2017-09-12 DIAGNOSIS — G43809 Other migraine, not intractable, without status migrainosus: Secondary | ICD-10-CM

## 2017-09-15 MED ORDER — RIZATRIPTAN BENZOATE 10 MG PO TBDP: 10.0000 mg | ORAL_TABLET | ORAL | 1 refills | Status: DC | PRN

## 2017-09-15 MED ORDER — ONDANSETRON 4 MG PO TBDP: 4.0000 mg | ORAL_TABLET | Freq: Three times a day (TID) | ORAL | 1 refills | Status: DC | PRN

## 2017-10-02 DIAGNOSIS — F419 Anxiety disorder, unspecified: Secondary | ICD-10-CM | POA: Diagnosis not present

## 2017-10-02 DIAGNOSIS — Z79899 Other long term (current) drug therapy: Secondary | ICD-10-CM | POA: Diagnosis not present

## 2017-10-02 DIAGNOSIS — F902 Attention-deficit hyperactivity disorder, combined type: Secondary | ICD-10-CM | POA: Diagnosis not present

## 2017-10-02 MED FILL — VYVANSE 30 MG CAPSULE: 30 | 30 days supply | Qty: 30 | Fill #0

## 2017-10-02 MED FILL — VYVANSE 10 MG CAPSULE: 10 | 30 days supply | Qty: 30 | Fill #0

## 2017-10-03 ENCOUNTER — Ambulatory Visit (HOSPITAL_COMMUNITY)
Admission: EM | Admit: 2017-10-03 | Discharge: 2017-10-03 | Disposition: A | Discharge status: Home / Self Care | Payer: 59 | Attending: Emergency Medicine | Admitting: Emergency Medicine

## 2017-10-03 ENCOUNTER — Other Ambulatory Visit: Payer: Self-pay

## 2017-10-03 ENCOUNTER — Encounter (HOSPITAL_COMMUNITY): Payer: Self-pay | Admitting: Emergency Medicine

## 2017-10-03 DIAGNOSIS — B9789 Other viral agents as the cause of diseases classified elsewhere: Secondary | ICD-10-CM | POA: Diagnosis not present

## 2017-10-03 DIAGNOSIS — J069 Acute upper respiratory infection, unspecified: Secondary | ICD-10-CM

## 2017-10-03 MED ORDER — BENZONATATE 100 MG PO CAPS: 100.0000 mg | ORAL_CAPSULE | Freq: Three times a day (TID) | ORAL | 0 refills | Status: AC

## 2017-10-03 MED ORDER — AMOXICILLIN-POT CLAVULANATE 875-125 MG PO TABS: 1.0000 | ORAL_TABLET | Freq: Two times a day (BID) | ORAL | 0 refills | Status: AC

## 2017-10-03 MED ORDER — ONDANSETRON HCL 4 MG PO TABS: 4.0000 mg | ORAL_TABLET | Freq: Four times a day (QID) | ORAL | 0 refills | Status: AC

## 2017-10-03 MED ORDER — FLUTICASONE PROPIONATE 50 MCG/ACT NA SUSP: 2.0000 | Freq: Every day | NASAL | 0 refills | Status: DC

## 2017-10-03 NOTE — ED Triage Notes (Signed)
Pt c/o coughing, fever chills, for a few days.

## 2017-10-03 NOTE — ED Provider Notes (Signed)
[facility]    CSN: [id number] Arrival date & time: 10/03/17  1642      History   Chief Complaint Chief Complaint  Patient presents with  . Cough    HPI [name] is a 32 y.o. female Patient is presenting with URI symptoms- congestion, cough, sore throat. Also with fever up to 101. Symptoms with gradual onset. Patient's main complaints are her thick mucous and concern for infection. Symptoms have been going on for 3 days. Patient has tried Dayquil cold and flu severe, with minimal relief. Denies vomiting, diarrhea. Mild nausea. Denies shortness of breath and chest pain. History of POTS.   HPI  Past Medical History:  Diagnosis Date  . Anxiety   . Asthma   . Concussion   . Depression   . GERD (gastroesophageal reflux disease)   . Headache(784.0)    Migraines  . Herpes   . Keratosis   . Kidney stone   . Nephrolithiasis   . PONV (postoperative nausea and vomiting)    prolonged sedation  . Postural orthostatic tachycardia syndrome   . Tingling in extremities    occ    Patient Active Problem List   Diagnosis Date Noted  . Swelling 06/12/2016  . Attention deficit hyperactivity disorder (ADHD), predominantly inattentive type 05/22/2016  . BMI 33.0-33.9,adult 05/22/2016  . Obesity, Class I, BMI 30-34.9 05/22/2016  . Generalized anxiety disorder 07/19/2015  . Stress incontinence 07/19/2015  . Allergic rhinitis 02/14/2015  . GERD (gastroesophageal reflux disease) 02/14/2015  . Asthma, chronic 12/19/2014  . Simple ovarian cyst 01/19/2014    Past Surgical History:  Procedure Laterality Date  . BLADDER SUSPENSION N/A 05/01/2016   Procedure: TRANSVAGINAL TAPE (TVT) PROCEDURE;  Surgeon: Cheri Fowler, MD;  Location: [facility];  Service: Gynecology;  Laterality: N/A;  . BREAST ENHANCEMENT SURGERY    . CYSTOCELE REPAIR N/A 05/01/2016   Procedure: ANTERIOR REPAIR (CYSTOCELE);  Surgeon: Cheri Fowler, MD;  Location: [facility];  Service: Gynecology;  Laterality: N/A;    . NO PAST SURGERIES      OB History    Gravida Para Term Preterm AB Living   3 3 3  0 0 3   SAB TAB Ectopic Multiple Live Births   0 0 0 0 3       Home Medications    Prior to Admission medications   Medication Sig Start Date End Date Taking? Authorizing Provider  albuterol (PROAIR HFA) 108 (90 Base) MCG/ACT inhaler ProAir HFA 90 mcg/actuation aerosol inhaler    [provider]  amoxicillin-clavulanate (AUGMENTIN) 875-125 MG tablet Take 1 tablet by mouth every 12 (twelve) hours for 7 days. 10/03/17 10/10/17  Wyett Narine C, PA-C  benzonatate (TESSALON) 100 MG capsule Take 1 capsule (100 mg total) by mouth every 8 (eight) hours for 5 days. 10/03/17 10/08/17  Korayma Hagwood C, PA-C  busPIRone (BUSPAR) 10 MG tablet Take 1 tablet (10 mg total) 2 (two) times daily by mouth. 08/07/17   Sharion Balloon, FNP  fluticasone (FLONASE) 50 MCG/ACT nasal spray Place 2 sprays into both nostrils daily. 10/03/17   Printice Hellmer C, PA-C  levonorgestrel (MIRENA, 52 MG,) 20 MCG/24HR IUD Mirena 20 mcg/24 hr (5 years) intrauterine device  Take 1 insert by intrauterine route.    [provider]  ondansetron (ZOFRAN) 4 MG tablet Take 1 tablet (4 mg total) by mouth every 6 (six) hours for 5 days. 10/03/17 10/08/17  Isrrael Fluckiger C, PA-C  ondansetron (ZOFRAN-ODT) 4 MG disintegrating tablet Take 1 tablet (  4 mg total) by mouth every 8 (eight) hours as needed for nausea or vomiting. 09/15/17   Eustaquio Maize, MD  rizatriptan (MAXALT-MLT) 10 MG disintegrating tablet Take 1 tablet (10 mg total) by mouth as needed for migraine. May repeat in 2 hours if needed 09/15/17   Eustaquio Maize, MD  trimethoprim-polymyxin b [facility]) ophthalmic solution Place 2 drops every 6 (six) hours into both eyes. 08/07/17   Sharion Balloon, FNP  valACYclovir (VALTREX) 500 MG tablet Take 500mg  BID x 3 days at first sign of outbreak 07/29/17   Eustaquio Maize, MD  VYVANSE 40 MG capsule  01/28/17   [provider]     Family History Family History  Problem Relation Age of Onset  . Alcohol abuse Father   . Diabetes Father   . Arthritis Maternal Aunt   . Arthritis Maternal Grandmother   . COPD Maternal Grandmother   . Thyroid disease Maternal Grandmother   . Alcohol abuse Maternal Grandfather   . Cancer Maternal Grandfather        liver, pancreatic  . Cancer Paternal Grandmother        brain  . Alcohol abuse Paternal Grandfather   . Stroke Paternal Grandfather   . Heart disease Neg Hx        early heart disease  . Sudden death Neg Hx        cardiac  . Cardiomyopathy Neg Hx     Social History Social History   Tobacco Use  . Smoking status: Never Smoker  . Smokeless tobacco: Never Used  Substance Use Topics  . Alcohol use: No    Comment: occas  . Drug use: No     Allergies   Prozac [fluoxetine hcl] and Latex   Review of Systems Review of Systems  Constitutional: Positive for fever. Negative for chills and fatigue.  HENT: Positive for congestion, ear pain, rhinorrhea, sinus pressure and sore throat. Negative for trouble swallowing.   Respiratory: Positive for cough. Negative for chest tightness and shortness of breath.   Cardiovascular: Negative for chest pain.  Gastrointestinal: Positive for nausea. Negative for abdominal pain and vomiting.  Musculoskeletal: Negative for myalgias.  Skin: Negative for rash.  Neurological: Negative for dizziness, light-headedness and headaches.     Physical Exam Triage Vital Signs ED Triage Vitals  Enc Vitals Group     BP 10/03/17 1734 129/81     Pulse Rate 10/03/17 1734 (!) 116     Resp 10/03/17 1734 18     Temp 10/03/17 1734 99.3 F (37.4 C)     Temp src --      SpO2 10/03/17 1734 100 %     Weight --      Height --      Head Circumference --      Peak Flow --      Pain Score 10/03/17 1735 0     Pain Loc --      Pain Edu? --      Excl. in [place]? --    No data found.  Updated Vital Signs BP 129/81   Pulse (!) 116   Temp  99.3 F (37.4 C)   Resp 18   SpO2 100%    Physical Exam  Constitutional: She appears well-developed and well-nourished. No distress.  HENT:  Head: Normocephalic and atraumatic.  Right Ear: Tympanic membrane and ear canal normal.  Left Ear: Tympanic membrane and ear canal normal.  Nose: Rhinorrhea present.  Mouth/Throat: Uvula is midline  and mucous membranes are normal. No oral lesions. No trismus in the jaw. No uvula swelling. Posterior oropharyngeal erythema present.  Eyes: Conjunctivae are normal.  Neck: Neck supple.  Cardiovascular: Regular rhythm.  No murmur heard. tachycardia  Pulmonary/Chest: Effort normal and breath sounds normal. No respiratory distress. She has no wheezes. She has no rales.  Abdominal: Soft. There is no tenderness.  Musculoskeletal: She exhibits no edema.  Neurological: She is alert.  Skin: Skin is warm and dry.  Psychiatric: She has a normal mood and affect.  Nursing note and vitals reviewed.    UC Treatments / Results  Labs (all labs ordered are listed, but only abnormal results are displayed) Labs Reviewed - No data to display  EKG  EKG Interpretation None       Radiology No results found.  Procedures Procedures (including critical care time)  Medications Ordered in UC Medications - No data to display   Initial Impression / Assessment and Plan / UC Course  I have reviewed the triage vital signs and the nursing notes.  Pertinent labs & imaging results that were available during my care of the patient were reviewed by me and considered in my medical decision making (see chart for details).    Patient with fever and tachycardia.  Patient presents with symptoms likely from a viral upper respiratory infection vs. influenza. Differential includes bacterial pneumonia, sinusitis, allergic rhinitis, acute bronchitis. Do not suspect underlying cardiopulmonary process. Symptoms seem unlikely related to ACS, CHF or COPD exacerbations,  pneumonia, pneumothorax. Patient is nontoxic appearing and not in need of emergent medical intervention.  Zofran for nausea, Tessalon for cough. Recommended symptom control with over the counter medications: Daily oral anti-histamine, Oral decongestant or IN corticosteroid, saline irrigations, cepacol lozenges, Robitussin, Delsym, honey tea. Provided Augmentin to fill if symptoms not improving next week.   Return if symptoms fail to improve in 1-2 weeks or you develop shortness of breath, chest pain, severe headache. Patient states understanding and is agreeable.  Discharged with PCP followup.  Final Clinical Impressions(s) / UC Diagnoses   Final diagnoses:  Viral URI with cough    ED Discharge Orders        Ordered    ondansetron (ZOFRAN) 4 MG tablet  Every 6 hours     10/03/17 1835    benzonatate (TESSALON) 100 MG capsule  Every 8 hours     10/03/17 1835    fluticasone (FLONASE) 50 MCG/ACT nasal spray  Daily     10/03/17 1835    amoxicillin-clavulanate (AUGMENTIN) 875-125 MG tablet  Every 12 hours     10/03/17 1835       Controlled Substance Prescriptions [place] Controlled Substance Registry consulted? Not Applicable   Janith L[place], [place] 10/03/17 2200

## 2017-10-03 NOTE — Discharge Instructions (Signed)
You likely having a viral upper respiratory infection. We recommended symptom control. I expect your symptoms to start improving in the next 1-2 weeks. You may fill Antibiotic earlier next week if you are not filling better.   1. Take a daily allergy pill/anti-histamine like Zyrtec, Claritin, or Store brand consistently for 2 weeks  2. For congestion you may try an oral decongestant like Mucinex or sudafed. You may also try intranasal flonase nasal spray or saline irrigations (neti pot, sinus cleanse)  3. For your sore throat you may try cepacol lozenges, salt water gargles, throat spray. Treatment of congestion may also help your sore throat.  4. For cough you may try Robitussen, Mucinex DM  5. Take Tylenol or Ibuprofen to help with pain/inflammation  6. Stay hydrated, drink plenty of fluids to keep throat coated and less irritated- try Gatorade with your history of POTS  Honey Tea For cough/sore throat try using a honey-based tea. Use 3 teaspoons of honey with juice squeezed from half lemon. Place shaved pieces of ginger into 1/2-1 cup of water and warm over stove top. Then mix the ingredients and repeat every 4 hours as needed.

## 2017-10-22 IMAGING — US US ABDOMEN COMPLETE
1 series · 14 of 25 positions shown · non-contrast
Comparison: CT 01/19/2015

CLINICAL DATA: Right upper quadrant pain

EXAM:
ABDOMEN ULTRASOUND COMPLETE

[Series 1: us abdomen complete · 0.22mm/px · 14 of 80 slices shown]
[im 1/80]
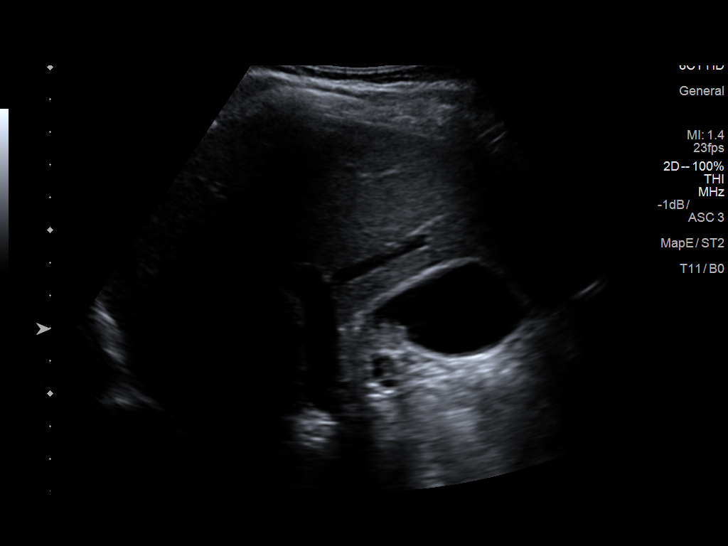
[im 7/80]
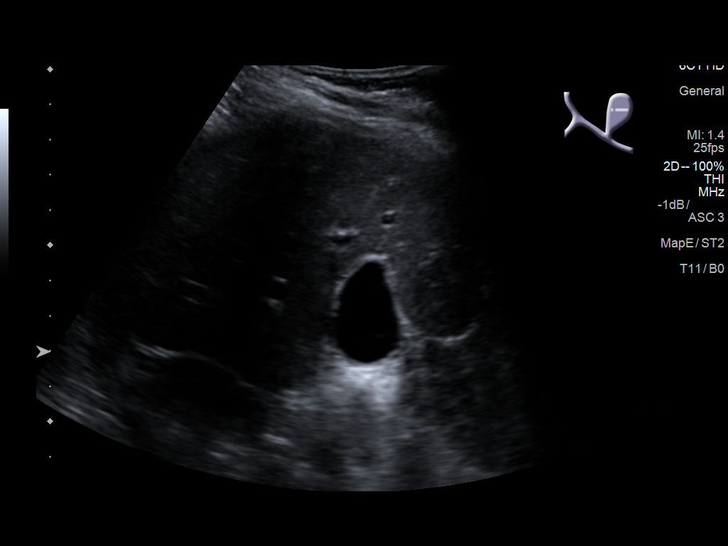
[im 14/80]
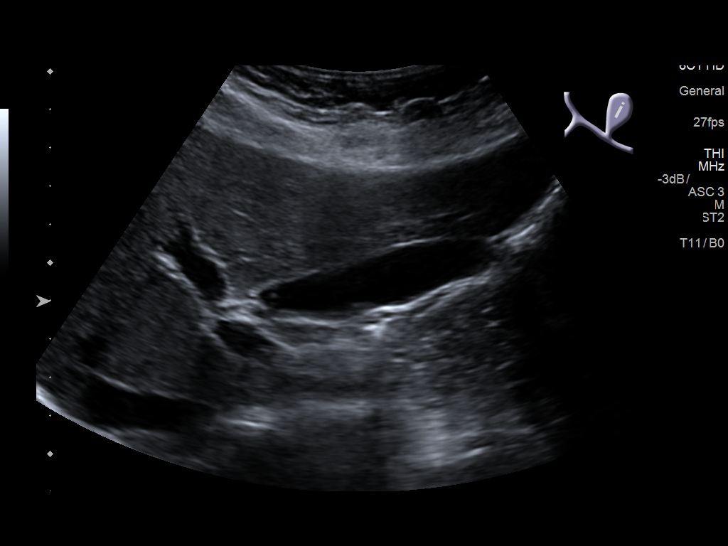
[im 20/80]
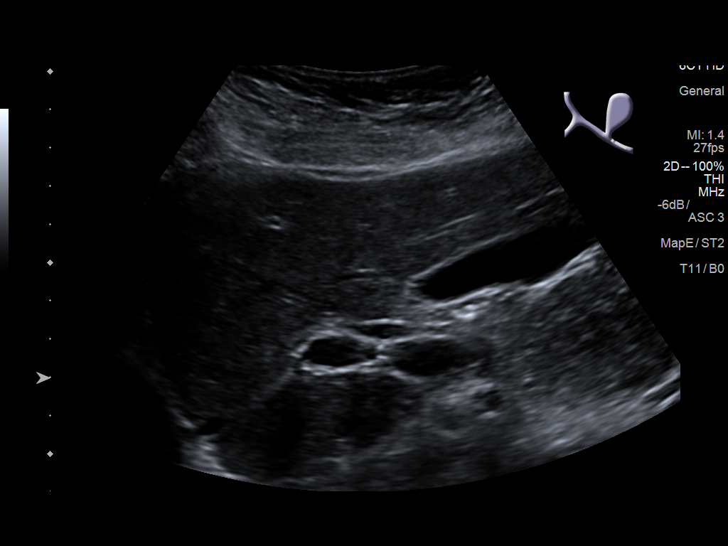
[im 27/80]
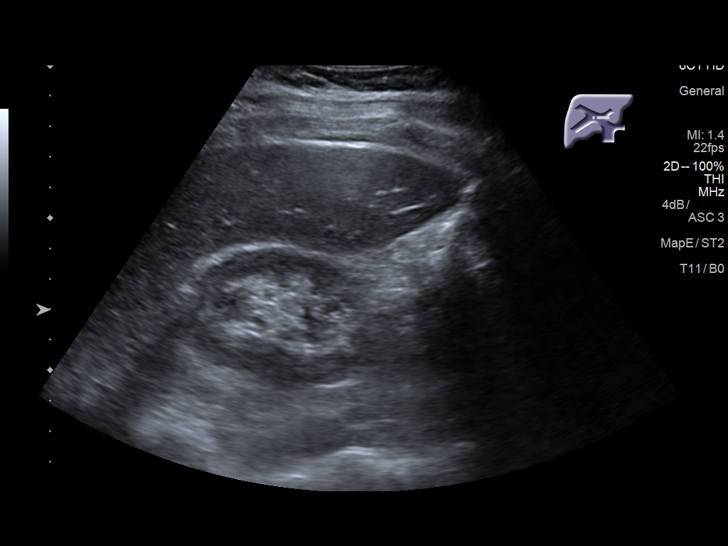
[im 30/80]
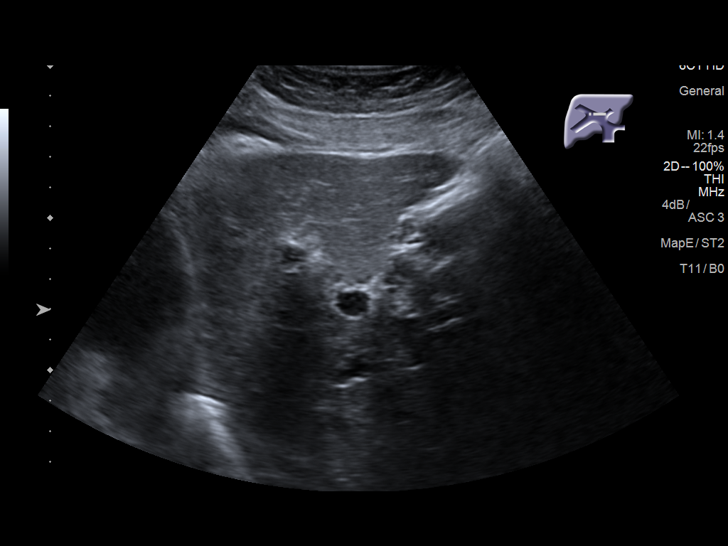
[im 37/80]
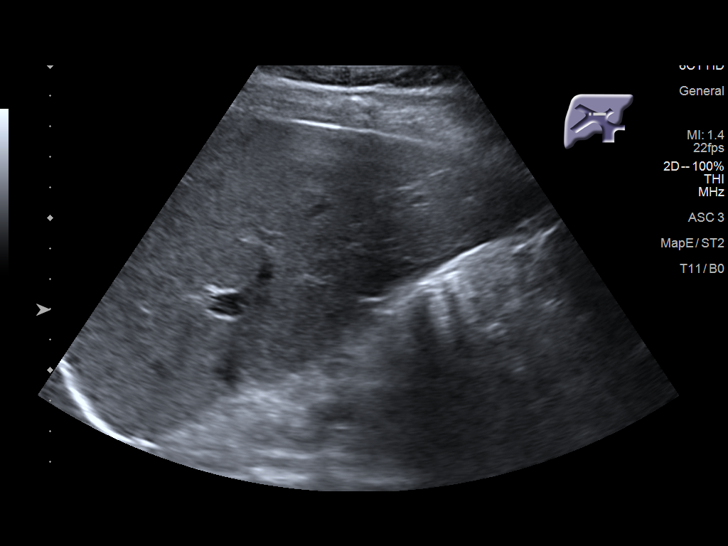
[im 43/80]
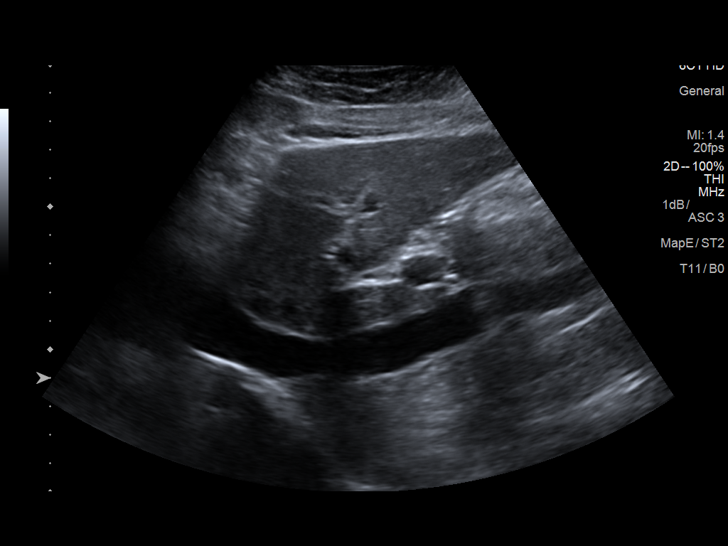
[im 50/80]
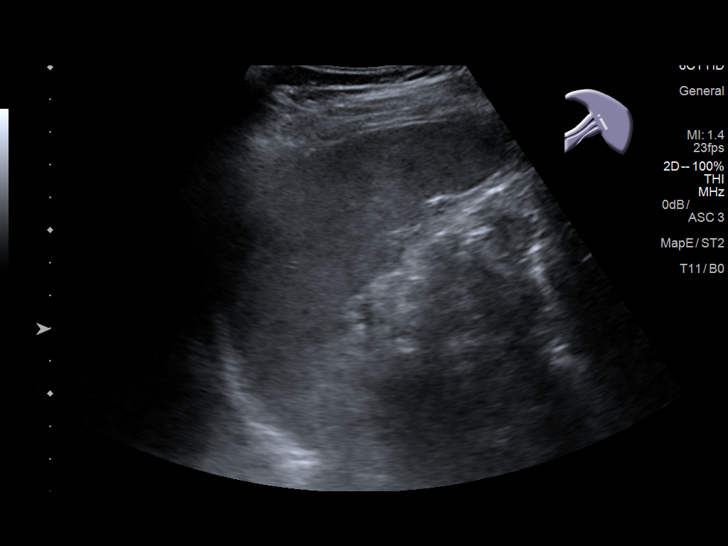
[im 53/80]
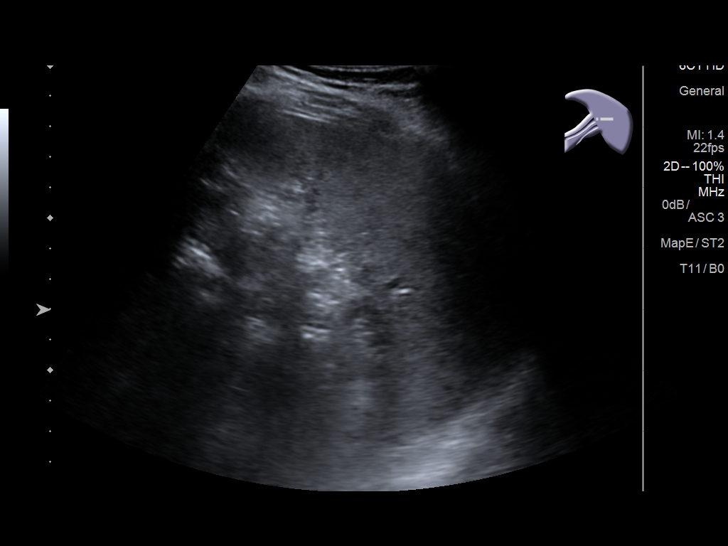
[im 60/80]
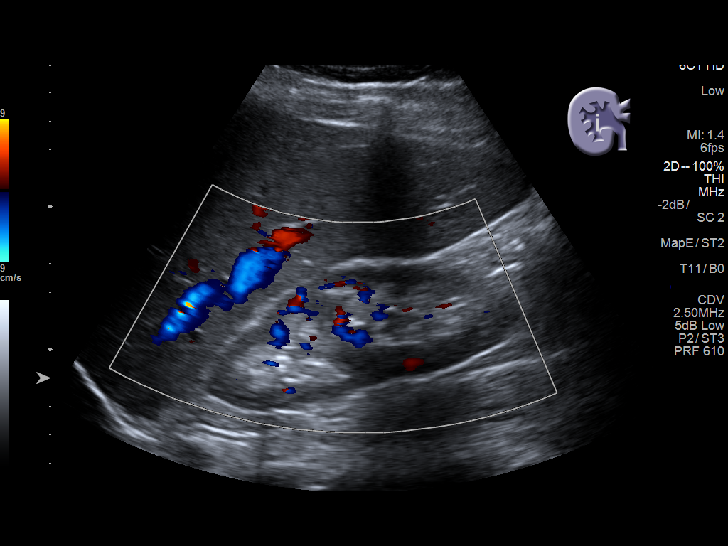
[im 66/80]
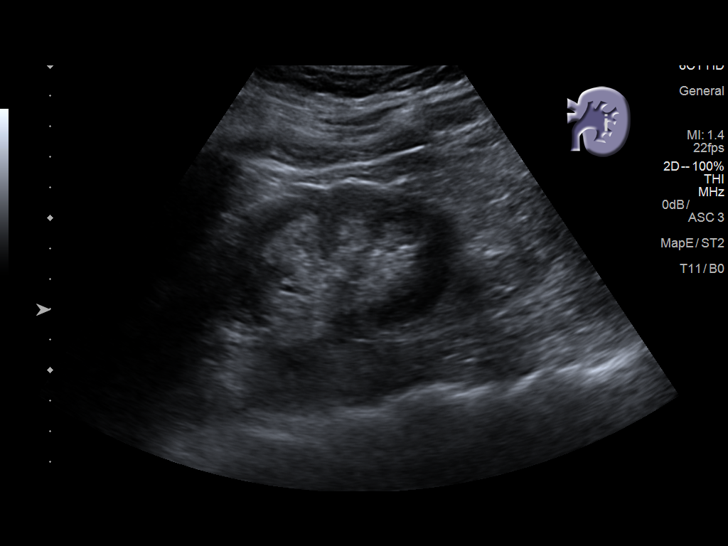
[im 73/80]
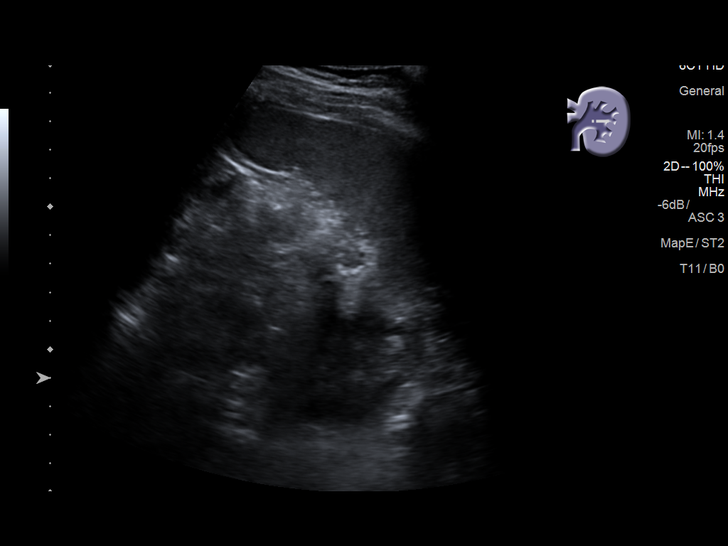
[im 80/80]
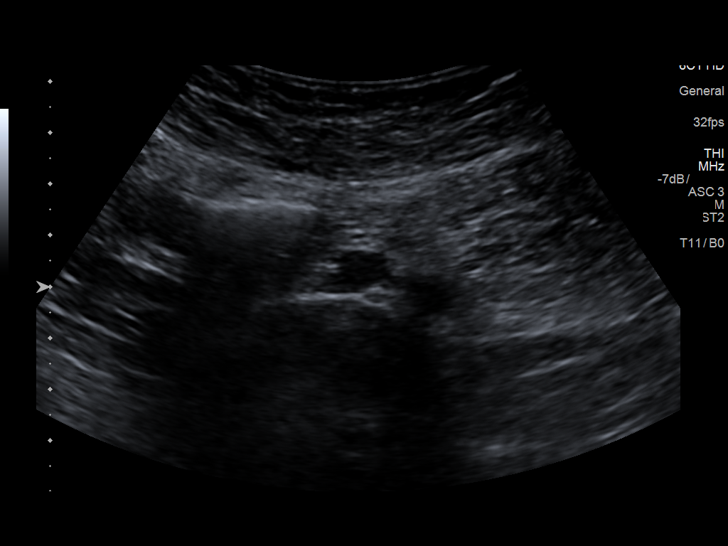

[14 of 25 positions shown; findings below may reference images not displayed]

FINDINGS: Gallbladder: No gallstones or wall thickening visualized. No
sonographic Murphy sign noted by sonographer.

Common bile duct: Diameter: Normal caliber, 4 mm

Liver: No focal lesion identified. Within normal limits in
parenchymal echogenicity. Portal vein is patent on color Doppler
imaging with normal direction of blood flow towards the liver.

IVC: No abnormality visualized.

Pancreas: Visualized portion unremarkable.

Spleen: Size and appearance within normal limits.

Right Kidney: Length: 12.0 cm. Echogenicity within normal limits. No
mass or hydronephrosis visualized.

Left Kidney: Length: 12.3 cm. Echogenicity within normal limits. No
mass or hydronephrosis visualized.

Abdominal aorta: No aneurysm visualized.

Other findings: None.
IMPRESSION: Unremarkable abdominal ultrasound.

## 2017-11-11 MED FILL — VYVANSE 10 MG CAPSULE: 10 | 30 days supply | Qty: 30 | Fill #0

## 2017-11-11 MED FILL — VYVANSE 30 MG CAPSULE: 30 | 30 days supply | Qty: 30 | Fill #0

## 2017-11-27 ENCOUNTER — Encounter: Payer: Self-pay | Admitting: Pediatrics

## 2017-11-27 DIAGNOSIS — B373 Candidiasis of vulva and vagina: Secondary | ICD-10-CM

## 2017-11-27 DIAGNOSIS — B3731 Acute candidiasis of vulva and vagina: Secondary | ICD-10-CM

## 2017-11-27 DIAGNOSIS — G43809 Other migraine, not intractable, without status migrainosus: Secondary | ICD-10-CM

## 2017-11-27 MED ORDER — FLUCONAZOLE 150 MG PO TABS: 150.0000 mg | ORAL_TABLET | Freq: Once | ORAL | 0 refills | Status: AC

## 2017-11-27 MED ORDER — ONDANSETRON 4 MG PO TBDP: 4.0000 mg | ORAL_TABLET | Freq: Two times a day (BID) | ORAL | 2 refills | Status: DC | PRN

## 2017-11-27 MED FILL — FLUCONAZOLE 150 MG TABLET: 150 | 1 days supply | Qty: 1 | Fill #0

## 2017-11-27 MED FILL — ONDANSETRON ODT 4 MG TABLET: 4 | 3 days supply | Qty: 6 | Fill #0

## 2017-11-28 ENCOUNTER — Telehealth: Payer: Self-pay | Admitting: Pediatrics

## 2017-11-28 DIAGNOSIS — Z20828 Contact with and (suspected) exposure to other viral communicable diseases: Secondary | ICD-10-CM

## 2017-11-28 MED ORDER — OSELTAMIVIR PHOSPHATE 75 MG PO CAPS: 75.0000 mg | ORAL_CAPSULE | Freq: Every day | ORAL | 0 refills | Status: AC

## 2017-11-28 NOTE — Telephone Encounter (Signed)
Please advise on script for tamiflu.

## 2017-11-28 NOTE — Telephone Encounter (Signed)
Does she have any symptoms? If she has no flu symptoms she can take tamiflu once a day for ten days as prophylaxis. If she starts having flu-like symptoms she should take it twice a day. Tamiflu may help decrease symptom severity and length of time being sick by about a day. I sent Rx in

## 2017-11-28 NOTE — Telephone Encounter (Signed)
*  below. 

## 2017-11-28 NOTE — Telephone Encounter (Signed)
Pt aware.

## 2017-11-28 NOTE — Telephone Encounter (Signed)
Please send RX to [facility] in [place]

## 2017-12-25 DIAGNOSIS — F902 Attention-deficit hyperactivity disorder, combined type: Secondary | ICD-10-CM | POA: Diagnosis not present

## 2017-12-25 DIAGNOSIS — Z79899 Other long term (current) drug therapy: Secondary | ICD-10-CM | POA: Diagnosis not present

## 2017-12-25 MED FILL — VYVANSE 10 MG CAPSULE: 10 | 30 days supply | Qty: 30 | Fill #0

## 2017-12-25 MED FILL — VYVANSE 30 MG CAPSULE: 30 | 30 days supply | Qty: 30 | Fill #0

## 2018-01-27 MED FILL — VYVANSE 10 MG CAPSULE: 10 | 30 days supply | Qty: 30 | Fill #0

## 2018-01-27 MED FILL — VYVANSE 30 MG CAPSULE: 30 | 30 days supply | Qty: 30 | Fill #0

## 2018-03-18 ENCOUNTER — Encounter: Payer: Self-pay | Admitting: Pediatrics

## 2018-03-18 ENCOUNTER — Ambulatory Visit (INDEPENDENT_AMBULATORY_CARE_PROVIDER_SITE_OTHER): Payer: 59 | Admitting: Pediatrics

## 2018-03-18 VITALS — BP 110/77 | HR 81 | Temp 98.0°F | Ht 62.0 in | Wt 156.8 lb

## 2018-03-18 DIAGNOSIS — Z6828 Body mass index (BMI) 28.0-28.9, adult: Secondary | ICD-10-CM | POA: Diagnosis not present

## 2018-03-18 DIAGNOSIS — Z0000 Encounter for general adult medical examination without abnormal findings: Secondary | ICD-10-CM | POA: Diagnosis not present

## 2018-03-18 NOTE — Progress Notes (Signed)
  Subjective:   Patient ID: [name], female    DOB: 08/18/1986, 32 y.o.   MRN: [id number] CC: Annual Exam (Physical for [employer])  HPI: [name] is a 32 y.o. 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  Overall is been feeling well.  Following with psychiatry for ADHD.  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  Stays fairly active at work.  Trying to avoid sugary foods.  Due for Pap smear.  She follows with gynecology, has an upcoming appointment.  Relevant past medical, surgical, family and social history reviewed. Allergies and medications reviewed and updated. Social History   Tobacco Use  Smoking Status Never Smoker  Smokeless Tobacco Never Used   ROS: All systems negative other than what is in the HPI  Objective:    BP 110/77   Pulse 81   Temp 98 F (36.7 C) (Oral)   Ht 5\' 2"  (1.575 m)   Wt 156 lb 12.8 oz (71.1 kg)   BMI 28.68 kg/m   Wt Readings from Last 3 Encounters:  03/18/18 156 lb 12.8 oz (71.1 kg)  08/07/17 155 lb 12.8 oz (70.7 kg)  06/04/17 153 lb (69.4 kg)    Gen: NAD, alert, cooperative with exam, NCAT EYES: EOMI, no conjunctival injection, or no icterus ENT:  TMs pearly gray b/l, OP without erythema LYMPH: no cervical LAD CV: NRRR, normal S1/S2, no murmur, distal pulses 2+ b/l Resp: CTABL, no wheezes, normal WOB Abd: +BS, soft, NTND. no guarding or organomegaly Ext: No edema, warm Neuro: Alert and oriented, strength equal b/l UE and LE, coordination grossly normal MSK: normal muscle bulk  Assessment & Plan:  [name] was seen today for annual exam.  Diagnoses and all orders for this visit:  Encounter for preventative adult health care examination Patient to follow-up with gynecology for Pap smear.  Needs TB test for school. -     QuantiFERON-TB Gold Plus  BMI 28 Continue lifestyle changes  Follow up plan: Return in about 1 year (around 03/19/2019). Caitlin Found, MD [place]

## 2018-03-20 DIAGNOSIS — F902 Attention-deficit hyperactivity disorder, combined type: Secondary | ICD-10-CM | POA: Diagnosis not present

## 2018-03-20 DIAGNOSIS — Z79899 Other long term (current) drug therapy: Secondary | ICD-10-CM | POA: Diagnosis not present

## 2018-03-23 LAB — QUANTIFERON-TB GOLD PLUS
QUANTIFERON TB1 AG VALUE: 0.02 [IU]/mL
QUANTIFERON TB2 AG VALUE: 0.02 [IU]/mL
QuantiFERON Mitogen Value: >10 IU/mL
QuantiFERON Nil Value: 0.02 IU/mL
QuantiFERON-TB Gold Plus: NEGATIVE

## 2018-03-30 MED FILL — VYVANSE 40 MG CAPSULE: 40 | 30 days supply | Qty: 30 | Fill #0

## 2018-05-05 MED FILL — VYVANSE 40 MG CAPSULE: 40 | 30 days supply | Qty: 30 | Fill #0

## 2018-05-06 DIAGNOSIS — Z130 Encounter for screening for diseases of the blood and blood-forming organs and certain disorders involving the immune mechanism: Secondary | ICD-10-CM | POA: Diagnosis not present

## 2018-05-06 DIAGNOSIS — Z1389 Encounter for screening for other disorder: Secondary | ICD-10-CM | POA: Diagnosis not present

## 2018-05-06 DIAGNOSIS — N644 Mastodynia: Secondary | ICD-10-CM | POA: Diagnosis not present

## 2018-05-06 DIAGNOSIS — Z1151 Encounter for screening for human papillomavirus (HPV): Secondary | ICD-10-CM | POA: Diagnosis not present

## 2018-05-06 DIAGNOSIS — Z01419 Encounter for gynecological examination (general) (routine) without abnormal findings: Secondary | ICD-10-CM | POA: Diagnosis not present

## 2018-05-06 DIAGNOSIS — Z30431 Encounter for routine checking of intrauterine contraceptive device: Secondary | ICD-10-CM | POA: Diagnosis not present

## 2018-05-06 DIAGNOSIS — Z6829 Body mass index (BMI) 29.0-29.9, adult: Secondary | ICD-10-CM | POA: Diagnosis not present

## 2018-05-06 DIAGNOSIS — Z124 Encounter for screening for malignant neoplasm of cervix: Secondary | ICD-10-CM | POA: Diagnosis not present

## 2018-06-05 MED FILL — VYVANSE 40 MG CAPSULE: 40 | 30 days supply | Qty: 30 | Fill #0

## 2018-06-25 DIAGNOSIS — Z79899 Other long term (current) drug therapy: Secondary | ICD-10-CM | POA: Diagnosis not present

## 2018-06-25 DIAGNOSIS — F902 Attention-deficit hyperactivity disorder, combined type: Secondary | ICD-10-CM | POA: Diagnosis not present

## 2018-07-08 MED FILL — VYVANSE 40 MG CAPSULE: 40 | 30 days supply | Qty: 30 | Fill #0

## 2018-07-08 MED FILL — VYVANSE 10 MG CAPSULE: 10 | 30 days supply | Qty: 30 | Fill #0

## 2018-08-03 ENCOUNTER — Ambulatory Visit (INDEPENDENT_AMBULATORY_CARE_PROVIDER_SITE_OTHER): Payer: Self-pay | Admitting: Family Medicine

## 2018-08-03 VITALS — BP 116/80 | HR 75 | Temp 98.2°F | Resp 20 | Wt 157.2 lb

## 2018-08-03 DIAGNOSIS — H04123 Dry eye syndrome of bilateral lacrimal glands: Secondary | ICD-10-CM | POA: Diagnosis not present

## 2018-08-03 DIAGNOSIS — B354 Tinea corporis: Secondary | ICD-10-CM

## 2018-08-03 DIAGNOSIS — H5203 Hypermetropia, bilateral: Secondary | ICD-10-CM | POA: Diagnosis not present

## 2018-08-03 DIAGNOSIS — H52223 Regular astigmatism, bilateral: Secondary | ICD-10-CM | POA: Diagnosis not present

## 2018-08-03 DIAGNOSIS — L739 Follicular disorder, unspecified: Secondary | ICD-10-CM

## 2018-08-03 MED ORDER — MUPIROCIN 2 % EX OINT: TOPICAL_OINTMENT | CUTANEOUS | 0 refills | Status: DC

## 2018-08-03 MED ORDER — FLUCONAZOLE 150 MG PO TABS: ORAL_TABLET | ORAL | 0 refills | Status: DC

## 2018-08-03 MED FILL — FLUCONAZOLE 150 MG TABS: 150 | 7 days supply | Qty: 2 | Fill #0

## 2018-08-03 MED FILL — MUPIROCIN 2% OINTMENT: 2 | 10 days supply | Qty: 22 | Fill #0

## 2018-08-03 NOTE — Patient Instructions (Signed)

## 2018-08-03 NOTE — Progress Notes (Signed)
[name] is a 32 y.o. female who presents today with concerns of skin rash/irritation to her right groin area. She reports that the area has been itchy and bothersome for the last 3 weeks. She denies any precipitating event of cause. She does report that she has refrained from shaving for the last 3 weeks since symptoms began- she reports that the recent shaving has once again aggravated symptoms. She reports she has attempted to use a myriad of treatments from Nystatin to vagisil without much relief or improvement.  Review of Systems  Constitutional: Negative for chills, fever and malaise/fatigue.  HENT: Negative for congestion, ear discharge, ear pain, sinus pain and sore throat.   Eyes: Negative.   Respiratory: Negative for cough, sputum production and shortness of breath.   Cardiovascular: Negative.  Negative for chest pain.  Gastrointestinal: Negative for abdominal pain, diarrhea, nausea and vomiting.  Genitourinary: Negative for dysuria, frequency, hematuria and urgency.  Musculoskeletal: Negative for myalgias.  Skin: Positive for itching and rash.  Neurological: Negative for headaches.  Endo/Heme/Allergies: Negative.   Psychiatric/Behavioral: Negative.     O: Vitals:   08/03/18 1343  BP: 116/80  Pulse: 75  Resp: 20  Temp: 98.2 F (36.8 C)  SpO2: 98%     Physical Exam  Constitutional: Vital signs are normal. She appears well-developed and well-nourished.  HENT:  Head: Normocephalic.  Right Ear: Hearing, tympanic membrane, external ear and ear canal normal.  Left Ear: Hearing, tympanic membrane, external ear and ear canal normal.  Nose: Nose normal. Right sinus exhibits no maxillary sinus tenderness and no frontal sinus tenderness. Left sinus exhibits no maxillary sinus tenderness and no frontal sinus tenderness.  Cardiovascular: Regular rhythm.  Pulmonary/Chest: Effort normal and breath sounds normal.  Lymphadenopathy:    She has no cervical adenopathy.    She has  no axillary adenopathy.  Skin: Rash noted. No abrasion, no bruising, no burn, no ecchymosis, no laceration, no lesion, no petechiae and no purpura noted. Rash is not macular, not papular, not maculopapular, not nodular, not pustular, not vesicular and not urticarial. There is erythema.     Follicular irritation and erythema evidence of pinpoint lesion x 1 and skin discoloration consistent with recent irritation- photo provided provided evidence of historical presentation that is consistent with single fungal lesion with raised edges.   A: 1. Folliculitis   2. Tinea corporis    P: Discussed exam findings, diagnosis etiology and medication use and indications reviewed with patient. Follow- Up and discharge instructions provided. No emergent/urgent issues found on exam.  Patient verbalized understanding of information provided and agrees with plan of care (POC), all questions answered.  1. Folliculitis - mupirocin ointment (BACTROBAN) 2 %; AAA twice daily for 10 days. Apply small amount to clean dry skin. May mix with OTC hydrocortisone for relief of itching and erythema.  2. Tinea corporis - fluconazole (DIFLUCAN) 150 MG tablet; Take 1 tablet today and then another in 7 days.

## 2018-08-18 MED FILL — VYVANSE 40 MG CAPSULE: 40 | 30 days supply | Qty: 30 | Fill #0

## 2018-08-22 ENCOUNTER — Encounter: Payer: Self-pay | Admitting: Family Medicine

## 2018-08-22 ENCOUNTER — Ambulatory Visit: Payer: 59 | Admitting: Family Medicine

## 2018-08-22 VITALS — BP 118/83 | HR 75 | Temp 98.5°F | Ht 62.0 in | Wt 161.8 lb

## 2018-08-22 DIAGNOSIS — B009 Herpesviral infection, unspecified: Secondary | ICD-10-CM | POA: Diagnosis not present

## 2018-08-22 DIAGNOSIS — K121 Other forms of stomatitis: Secondary | ICD-10-CM | POA: Diagnosis not present

## 2018-08-22 DIAGNOSIS — B359 Dermatophytosis, unspecified: Secondary | ICD-10-CM | POA: Diagnosis not present

## 2018-08-22 MED ORDER — AMOXICILLIN-POT CLAVULANATE 875-125 MG PO TABS: 1.0000 | ORAL_TABLET | Freq: Two times a day (BID) | ORAL | 0 refills | Status: DC

## 2018-08-22 MED ORDER — VALACYCLOVIR HCL 500 MG PO TABS: 500.0000 mg | ORAL_TABLET | Freq: Two times a day (BID) | ORAL | 0 refills | Status: AC

## 2018-08-22 MED ORDER — TERBINAFINE HCL 250 MG PO TABS: 250.0000 mg | ORAL_TABLET | Freq: Every day | ORAL | 0 refills | Status: DC

## 2018-08-22 NOTE — Progress Notes (Signed)
Subjective: CC: Rash PCP: Eustaquio Maize, MD TKZ:[id number] Dix is a 32 y.o. female presenting to clinic today for:  1. Rash Patient reports onset of rash in groin several weeks ago.  She notes that there is a spot starting on the left side of her groin now as well.  She was self treating with over-the-counter Lotrimin cream for about 2 to 3 weeks with no resolution of symptoms.  She was then seen at an urgent care facility and had fluconazole prescribed to take once weekly for 2 weeks.  She notes that after each dose of fluconazole symptoms seem to get somewhat better but then ultimately got worse again.  She describes diffuse itching.  She is applied Bactroban mixed with an over-the-counter hydrocortisone cream in efforts to relieve symptoms.  She has been using cotton underwear in efforts to let the region air out.  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  Additionally, she notes that she seems to be having an HSV outbreak, which she has not had in many years.  She has not done anything for treatment of this yet.  2.  Oral lesion Patient reports that she recently had dental work done and that she is now developed a skin flap that is tender in the inside of the right side of her mouth.  Denies any exudate or purulence.  No fevers.   ROS: Per HPI  Allergies  Allergen Reactions  . Prozac [Fluoxetine Hcl] Nausea Only  . Latex Rash   Past Medical History:  Diagnosis Date  . Anxiety   . Asthma   . Concussion   . Depression   . GERD (gastroesophageal reflux disease)   . Headache(784.0)    Migraines  . Herpes   . Keratosis   . Kidney stone   . Nephrolithiasis   . PONV (postoperative nausea and vomiting)    prolonged sedation  . Postural orthostatic tachycardia syndrome   . Tingling in extremities    occ    Current Outpatient Medications:  .  albuterol (PROAIR HFA) 108 (90 Base) MCG/ACT inhaler, ProAir  HFA 90 mcg/actuation aerosol inhaler, Disp: , Rfl:  .  fluticasone (FLONASE) 50 MCG/ACT nasal spray, Place 2 sprays into both nostrils daily., Disp: 1 g, Rfl: 0 .  levonorgestrel (MIRENA, 52 MG,) 20 MCG/24HR IUD, Mirena 20 mcg/24 hr (5 years) intrauterine device  Take 1 insert by intrauterine route., Disp: , Rfl:  .  mupirocin ointment (BACTROBAN) 2 %, AAA twice daily for 10 days. Apply small amount to clean dry skin., Disp: 15 g, Rfl: 0 .  rizatriptan (MAXALT-MLT) 10 MG disintegrating tablet, Take 1 tablet (10 mg total) by mouth as needed for migraine. May repeat in 2 hours if needed, Disp: 10 tablet, Rfl: 1 .  VYVANSE 10 MG capsule, , Disp: , Rfl:  .  VYVANSE 30 MG capsule, , Disp: , Rfl: 0 Social History   Socioeconomic History  . Marital status: Married    Spouse name: Not on file  . Number of children: Not on file  . Years of education: Not on file  . Highest education level: Not on file  Occupational History  . Not on file  Social Needs  . Financial resource strain: Not on file  . Food insecurity:    Worry: Not on file    Inability: Not on file  . Transportation needs:    Medical: Not on file  Non-medical: Not on file  Tobacco Use  . Smoking status: Never Smoker  . Smokeless tobacco: Never Used  Substance and Sexual Activity  . Alcohol use: No    Comment: occas  . Drug use: No  . Sexual activity: Yes    Birth control/protection: IUD  Lifestyle  . Physical activity:    Days per week: Not on file    Minutes per session: Not on file  . Stress: Not on file  Relationships  . Social connections:    Talks on phone: Not on file    Gets together: Not on file    Attends religious service: Not on file    Active member of club or organization: Not on file    Attends meetings of clubs or organizations: Not on file    Relationship status: Not on file  . Intimate partner violence:    Fear of current or ex partner: Not on file    Emotionally abused: Not on file    Physically  abused: Not on file    Forced sexual activity: Not on file  Other Topics Concern  . Not on file  Social History Narrative   Married, 2 children.    Family History  Problem Relation Age of Onset  . Alcohol abuse Father   . Diabetes Father   . Arthritis Maternal Aunt   . Arthritis Maternal Grandmother   . COPD Maternal Grandmother   . Thyroid disease Maternal Grandmother   . Alcohol abuse Maternal Grandfather   . Cancer Maternal Grandfather        liver, pancreatic  . Cancer Paternal Grandmother        brain  . Alcohol abuse Paternal Grandfather   . Stroke Paternal Grandfather   . Heart disease Neg Hx        early heart disease  . Sudden death Neg Hx        cardiac  . Cardiomyopathy Neg Hx     Objective: Office vital signs reviewed. BP 118/83   Pulse 75   Temp 98.5 F (36.9 C) (Oral)   Ht 5\' 2"  (1.575 m)   Wt 161 lb 12.8 oz (73.4 kg)   SpO2 99%   BMI 29.59 kg/m   Physical Examination:  General: Awake, alert, well nourished, well appearing. No acute distress HEENT: Normal    Eyes: Sclera white    Throat: moist mucus membranes, mildly erythematous and somewhat ulcerated appearance of the buccal mucosa in the right superior region of the mouth.  There is no purulence or significant fluctuance noted in this region. GU: Somewhat macerated, erythematous rash noted in the right inguinal fold.  Left inguinal fold with a small area of similar.  No exudate or bleeding.  At the apex of the left labia majora there is a small herpetic ulceration noted.  Assessment/ Plan: 32 y.o. female   1. Tinea Tinea versus candidal intertrigo.  She has failed topical treatments as well as oral fluconazole.  We discussed consideration for Lamisil versus pursuing a longer duration of a prescribed antifungal.  Patient wishes to proceed with Lamisil orally for the next 1 to 2 weeks.  This has been sent.  If symptoms do not resolve, would consider referral to dermatology.  I did advise her to let  the region air out as much as possible and reduce any moisture buildup.  2. HSV-2 infection Valtrex 500 mg p.o. twice daily for the next 3 days.  Enough given for a couple of outbreaks.  She will follow-up with PCP PRN.  3. Ulceration, oral mucosa Likely related to instrumental manipulation and irritation of the mucosa while performing the dental procedure.  At this point no evidence of bacterial infection but I have given her a written prescription for Augmentin to use should any signs or symptoms of infection begin.  She is a Marine scientist in the emergency department and I think that she has good judgment as to when to start this medication.  She will follow-up for ongoing needs with PCP.   No orders of the defined types were placed in this encounter.  Meds ordered this encounter  Medications  . terbinafine (LAMISIL) 250 MG tablet    Sig: Take 1 tablet (250 mg total) by mouth daily. x7-14.    Dispense:  14 tablet    Refill:  0  . valACYclovir (VALTREX) 500 MG tablet    Sig: Take 1 tablet (500 mg total) by mouth 2 (two) times daily for 3 days.    Dispense:  15 tablet    Refill:  0  . amoxicillin-clavulanate (AUGMENTIN) 875-125 MG tablet    Sig: Take 1 tablet by mouth 2 (two) times daily.    Dispense:  20 tablet    Refill:  [place], DO [place] [phone]

## 2018-08-22 NOTE — Patient Instructions (Signed)
I have placed you on Valtrex for HSV and Lamisil for the rash.  You can use the lamisil for 1-2 weeks.

## 2018-09-07 ENCOUNTER — Telehealth: Payer: Self-pay | Admitting: Family Medicine

## 2018-09-07 DIAGNOSIS — R21 Rash and other nonspecific skin eruption: Secondary | ICD-10-CM

## 2018-09-07 NOTE — Telephone Encounter (Signed)
Ok to place derm referral.  She has been on multiple creams and nothing helped.

## 2018-09-07 NOTE — Telephone Encounter (Signed)
Pt states the rash has gotten worse and the terbinafine didn't help at all. Is there anything else we could send in? And did you want to put referral into dermatology?

## 2018-09-07 NOTE — Telephone Encounter (Signed)
Pt aware derm referral placed.

## 2018-09-17 ENCOUNTER — Telehealth: Payer: Self-pay | Admitting: Pediatrics

## 2018-09-17 NOTE — Telephone Encounter (Signed)
Patient states that rash will get better and will get worse after worki/ng 12 hour shift.  Patient can not get in with derm until 10/07/2018 and would like to have diflucan or fungal powder sent to pharmacy

## 2018-09-17 NOTE — Telephone Encounter (Signed)
Dermatology apt is 1/8 pt is wanting to know if she can have something sent in for the rash she has at her groin area until she can be seen by the specialist  PT is wanting to talk to the nurse about the different options for treatment before something is sent in.   Pharmacy [facility] [place].

## 2018-09-28 MED FILL — VYVANSE 40 MG CAPSULE: 40 | 30 days supply | Qty: 30 | Fill #0

## 2018-10-03 ENCOUNTER — Encounter: Payer: Self-pay | Admitting: Nurse Practitioner

## 2018-10-03 ENCOUNTER — Ambulatory Visit (INDEPENDENT_AMBULATORY_CARE_PROVIDER_SITE_OTHER): Payer: Self-pay | Admitting: Nurse Practitioner

## 2018-10-03 VITALS — BP 100/80 | HR 74 | Temp 98.7°F | Resp 10 | Wt 157.6 lb

## 2018-10-03 DIAGNOSIS — Z20828 Contact with and (suspected) exposure to other viral communicable diseases: Secondary | ICD-10-CM

## 2018-10-03 MED ORDER — OSELTAMIVIR PHOSPHATE 75 MG PO CAPS: 75.0000 mg | ORAL_CAPSULE | Freq: Every day | ORAL | 0 refills | Status: AC

## 2018-10-03 NOTE — Patient Instructions (Signed)
Influenza Exposure, Adult Influenza, more commonly known as "the flu," is a viral infection that mainly affects the respiratory tract. The respiratory tract includes organs that help you breathe, such as the lungs, nose, and throat. The flu causes many symptoms similar to the common cold along with high fever and body aches. The flu spreads easily from person to person (is contagious). Getting a flu shot (influenza vaccination) every year is the best way to prevent the flu. What are the causes? This condition is caused by the influenza virus. You can get the virus by:  Breathing in droplets that are in the air from an infected person's cough or sneeze.  Touching something that has been exposed to the virus (has been contaminated) and then touching your mouth, nose, or eyes. What increases the risk? The following factors may make you more likely to get the flu:  Not washing or sanitizing your hands often.  Having close contact with many people during cold and flu season.  Touching your mouth, eyes, or nose without first washing or sanitizing your hands.  Not getting a yearly (annual) flu shot. You may have a higher risk for the flu, including serious problems such as a lung infection (pneumonia), if you:  Are older than 65.  Are pregnant.  Have a weakened disease-fighting system (immune system). You may have a weakened immune system if you: ? Have HIV or AIDS. ? Are undergoing chemotherapy. ? Are taking medicines that reduce (suppress) the activity of your immune system.  Have a long-term (chronic) illness, such as heart disease, kidney disease, diabetes, or lung disease.  Have a liver disorder.  Are severely overweight (morbidly obese).  Have anemia. This is a condition that affects your red blood cells.  Have asthma. What are the signs or symptoms? Symptoms of this condition usually begin suddenly and last 4-14 days. They may include:  Fever and chills.  Headaches, body  aches, or muscle aches.  Sore throat.  Cough.  Runny or stuffy (congested) nose.  Chest discomfort.  Poor appetite.  Weakness or fatigue.  Dizziness.  Nausea or vomiting. How is this diagnosed? This condition may be diagnosed based on:  Your symptoms and medical history.  A physical exam.  Swabbing your nose or throat and testing the fluid for the influenza virus. How is this treated? If the flu is diagnosed early, you can be treated with medicine that can help reduce how severe the illness is and how long it lasts (antiviral medicine). This may be given by mouth (orally) or through an IV. Taking care of yourself at home can help relieve symptoms. Your health care provider may recommend:  Taking over-the-counter medicines.  Drinking plenty of fluids. In many cases, the flu goes away on its own. If you have severe symptoms or complications, you may be treated in a hospital. Follow these instructions at home: Activity  Rest as needed and get plenty of sleep.  Stay home from work or school as told by your health care provider. Unless you are visiting your health care provider, avoid leaving home until your fever has been gone for 24 hours without taking medicine. Eating and drinking  Take an oral rehydration solution (ORS). This is a drink that is sold at pharmacies and retail stores.  Drink enough fluid to keep your urine pale yellow.  Drink clear fluids in small amounts as you are able. Clear fluids include water, ice chips, diluted fruit juice, and low-calorie sports drinks.  Eat bland, easy-to-digest  foods in small amounts as you are able. These foods include bananas, applesauce, rice, lean meats, toast, and crackers.  Avoid drinking fluids that contain a lot of sugar or caffeine, such as energy drinks, regular sports drinks, and soda.  Avoid alcohol.  Avoid spicy or fatty foods. General instructions      Take over-the-counter and prescription medicines  only as told by your health care provider.  Use a cool mist humidifier to add humidity to the air in your home. This can make it easier to breathe.  Cover your mouth and nose when you cough or sneeze.  Wash your hands with soap and water often, especially after you cough or sneeze. If soap and water are not available, use alcohol-based hand sanitizer.  Keep all follow-up visits as told by your health care provider. This is important. How is this prevented?   Get an annual flu shot. You may get the flu shot in late summer, fall, or winter. Ask your health care provider when you should get your flu shot.  Avoid contact with people who are sick during cold and flu season. This is generally fall and winter. Contact a health care provider if:  You develop new symptoms.  You have: ? Chest pain. ? Diarrhea. ? A fever.  Your cough gets worse.  You produce more mucus.  You feel nauseous or you vomit. Get help right away if:  You develop shortness of breath or difficulty breathing.  Your skin or nails turn a bluish color.  You have severe pain or stiffness in your neck.  You develop a sudden headache or sudden pain in your face or ear.  You cannot eat or drink without vomiting. Summary  Influenza, more commonly known as "the flu," is a viral infection that primarily affects your respiratory tract.  Symptoms of the flu usually begin suddenly and last 4-14 days.  Getting an annual flu shot is the best way to prevent getting the flu.  Stay home from work or school as told by your health care provider. Unless you are visiting your health care provider, avoid leaving home until your fever has been gone for 24 hours without taking medicine.  Keep all follow-up visits as told by your health care provider. This is important. This information is not intended to replace advice given to you by your health care provider. Make sure you discuss any questions you have with your health care  provider. Document Released: 09/13/2000 Document Revised: 03/04/2018 Document Reviewed: 03/04/2018 Elsevier Interactive Patient Education  2019 [employer].

## 2018-10-03 NOTE — Progress Notes (Signed)
Subjective:     [name] is a 33 y.o. female who presents for evaluation after influenza exposure.  Patient is not having any active symptoms at this time.  Patient denies fever, chills, headache, nausea, vomiting, body aches, or sore throat.  Patient's husband was just diagnosed with influenza A, and her daughter was diagnosed with influenza B earlier this week.  Patient is here for prophylactic treatment.  The following portions of the patient's history were reviewed and updated as appropriate: allergies, current medications and past medical history.  Review of Systems Constitutional: negative Eyes: negative Ears, nose, mouth, throat, and face: negative Respiratory: negative Cardiovascular: negative Gastrointestinal: negative Neurological: negative     Objective:    BP 100/80   Pulse 74   Temp 98.7 F (37.1 C)   Resp 10   Wt 157 lb 9.6 oz (71.5 kg)   SpO2 97%   BMI 28.83 kg/m   General Appearance:    Alert, cooperative, no distress, appears stated age  Head:    Normocephalic, without obvious abnormality, atraumatic  Eyes:    PERRL, conjunctiva/corneas clear, EOM's intact, fundi    benign, both eyes  Ears:    Normal TM's and external ear canals, both ears  Nose:   Nares normal, septum midline, mucosa normal, no drainage    or sinus tenderness  Throat:   Lips, mucosa, and tongue normal; teeth and gums normal  Neck:   Supple, symmetrical, trachea midline, no adenopathy;    thyroid:  no enlargement/tenderness/nodules; no carotid   bruit or JVD  Lungs:     Clear to auscultation bilaterally, respirations unlabored   Heart:    Regular rate and rhythm, S1 and S2 normal, no murmur, rub   or gallop  Abdomen:     Soft, non-tender, bowel sounds active all four quadrants,    no masses, no organomegaly  Pulses:   2+ and symmetric all extremities  Skin:   Skin color, texture, turgor normal, no rashes or lesions  Lymph nodes:   Cervical, submandibular nodes normal  Neurologic:    CNII-XII intact, normal strength, sensation and reflexes    throughout      Assessment:    Influenza Exposure  Plan:     Exam findings, diagnosis etiology and medication use and indications reviewed with patient. Follow- Up and discharge instructions provided. No emergent/urgent issues found on exam.  Given the patient's double influenza exposure, will go ahead and treat prophylactically with Tamiflu.  Patient will follow-up as needed if symptoms develop.  Patient education was provided. Patient verbalized understanding of information provided and agrees with plan of care (POC), all questions answered. The patient is advised to call or return to clinic if condition does not see an improvement in symptoms, or to seek the care of the closest emergency department if condition worsens with the above plan.   1. Exposure to influenza  - oseltamivir (TAMIFLU) 75 MG capsule; Take 1 capsule (75 mg total) by mouth daily for 7 days.  Dispense: 7 capsule; Refill: 0 -If symptoms develop: -Take medication as prescribed. -Ibuprofen or Tylenol for pain, fever, or general discomfort. -Increase fluids. -Sleep elevated on at least 2 pillows at bedtime to help with cough. -Use a humidifier or vaporizer when at home and during sleep to help with cough. -May use a teaspoon of honey or over-the-counter cough drops to help with cough. -Remain home until fever free for 24 hours. -Get plenty of rest. -Follow-up if symptoms do not improve.

## 2018-10-07 DIAGNOSIS — B356 Tinea cruris: Secondary | ICD-10-CM | POA: Diagnosis not present

## 2018-10-07 DIAGNOSIS — L308 Other specified dermatitis: Secondary | ICD-10-CM | POA: Diagnosis not present

## 2018-10-07 DIAGNOSIS — D2272 Melanocytic nevi of left lower limb, including hip: Secondary | ICD-10-CM | POA: Diagnosis not present

## 2018-10-07 DIAGNOSIS — D485 Neoplasm of uncertain behavior of skin: Secondary | ICD-10-CM | POA: Diagnosis not present

## 2018-10-26 DIAGNOSIS — I499 Cardiac arrhythmia, unspecified: Secondary | ICD-10-CM | POA: Diagnosis not present

## 2018-10-26 DIAGNOSIS — Z79899 Other long term (current) drug therapy: Secondary | ICD-10-CM | POA: Diagnosis not present

## 2018-10-26 DIAGNOSIS — F902 Attention-deficit hyperactivity disorder, combined type: Secondary | ICD-10-CM | POA: Diagnosis not present

## 2018-10-26 DIAGNOSIS — F419 Anxiety disorder, unspecified: Secondary | ICD-10-CM | POA: Diagnosis not present

## 2018-10-29 ENCOUNTER — Encounter: Payer: Self-pay | Admitting: Pediatrics

## 2018-11-02 ENCOUNTER — Encounter: Payer: Self-pay | Admitting: Family Medicine

## 2018-11-02 ENCOUNTER — Ambulatory Visit (INDEPENDENT_AMBULATORY_CARE_PROVIDER_SITE_OTHER): Payer: 59 | Admitting: Family Medicine

## 2018-11-02 VITALS — BP 114/79 | HR 79 | Temp 98.2°F | Ht 62.0 in | Wt 161.0 lb

## 2018-11-02 DIAGNOSIS — K219 Gastro-esophageal reflux disease without esophagitis: Secondary | ICD-10-CM | POA: Diagnosis not present

## 2018-11-02 DIAGNOSIS — Z87898 Personal history of other specified conditions: Secondary | ICD-10-CM

## 2018-11-02 MED ORDER — OMEPRAZOLE 20 MG PO CPDR: 20.0000 mg | DELAYED_RELEASE_CAPSULE | Freq: Every day | ORAL | 3 refills | Status: DC

## 2018-11-02 MED ORDER — ONDANSETRON 4 MG PO TBDP: 4.0000 mg | ORAL_TABLET | Freq: Three times a day (TID) | ORAL | 0 refills | Status: DC | PRN

## 2018-11-02 MED ORDER — SCOPOLAMINE 1 MG/3DAYS TD PT72: 1.0000 | MEDICATED_PATCH | TRANSDERMAL | 12 refills | Status: DC

## 2018-11-02 MED FILL — SCOPOLAMINE 1 MG/3DAYS PT72: 1 | 30 days supply | Qty: 10 | Fill #0

## 2018-11-02 MED FILL — ONDANSETRON ODT 4 MG TABLET: 4 | 6 days supply | Qty: 20 | Fill #0

## 2018-11-02 MED FILL — VYVANSE 40 MG CAPSULE: 40 | 30 days supply | Qty: 30 | Fill #0

## 2018-11-02 MED FILL — OMEPRAZOLE 20 MG CPDR: 20 | 30 days supply | Qty: 30 | Fill #0

## 2018-11-02 NOTE — Progress Notes (Signed)
Subjective:    Patient ID: [name], female    DOB: Feb 05, 1986, 33 y.o.   MRN: [id number]  Chief Complaint:  Medication Refill (Zofran and nausea (going on cruise)) and Abdominal Pain (hurts all the time, worse after eating, would like to start PPI)   HPI: [name] is a 33 y.o. female presenting on 11/02/2018 for Medication Refill (Zofran and nausea (going on cruise)) and Abdominal Pain (hurts all the time, worse after eating, would like to start PPI)   1. Gastroesophageal reflux disease without esophagitis  Pt presents today for complaints of epigastric pain. She states this started several weeks ago and is getting worse. States it has become worse after starting to take motrin for headaches. States the pain is worse after eating or drinking. States at times the pain radiates into her lower abdomen. States she has been taking Pepcid complete with some relief of the symptoms. She denies blood in stool, hemoptysis, or dark tarry stools.    2. H/O motion sickness  Pt is planning a cruise and would like to get zofran and scopolamine patches to prevent motion sickness. States she took a cruise in the past. States she had significant motion sickness with nausea and vomiting. States she has taken zofran in the past without complications.        Relevant past medical, surgical, family, and social history reviewed and updated as indicated.  Allergies and medications reviewed and updated.   Past Medical History:  Diagnosis Date  . Anxiety   . Asthma   . Concussion   . Depression   . GERD (gastroesophageal reflux disease)   . Headache(784.0)    Migraines  . Herpes   . Keratosis   . Kidney stone   . Nephrolithiasis   . PONV (postoperative nausea and vomiting)    prolonged sedation  . Postural orthostatic tachycardia syndrome   . Tingling in extremities    occ    Past Surgical History:  Procedure Laterality Date  . BLADDER SUSPENSION N/A 05/01/2016   Procedure:  TRANSVAGINAL TAPE (TVT) PROCEDURE;  Surgeon: Cheri Fowler, MD;  Location: [facility];  Service: Gynecology;  Laterality: N/A;  . BREAST ENHANCEMENT SURGERY    . CYSTOCELE REPAIR N/A 05/01/2016   Procedure: ANTERIOR REPAIR (CYSTOCELE);  Surgeon: Cheri Fowler, MD;  Location: [facility];  Service: Gynecology;  Laterality: N/A;  . NO PAST SURGERIES      Social History   Socioeconomic History  . Marital status: Married    Spouse name: Not on file  . Number of children: Not on file  . Years of education: Not on file  . Highest education level: Not on file  Occupational History  . Not on file  Social Needs  . Financial resource strain: Not on file  . Food insecurity:    Worry: Not on file    Inability: Not on file  . Transportation needs:    Medical: Not on file    Non-medical: Not on file  Tobacco Use  . Smoking status: Never Smoker  . Smokeless tobacco: Never Used  Substance and Sexual Activity  . Alcohol use: No    Comment: occas  . Drug use: No  . Sexual activity: Yes    Birth control/protection: I.U.D.  Lifestyle  . Physical activity:    Days per week: Not on file    Minutes per session: Not on file  . Stress: Not on file  Relationships  . Social connections:    Talks  on phone: Not on file    Gets together: Not on file    Attends religious service: Not on file    Active member of club or organization: Not on file    Attends meetings of clubs or organizations: Not on file    Relationship status: Not on file  . Intimate partner violence:    Fear of current or ex partner: Not on file    Emotionally abused: Not on file    Physically abused: Not on file    Forced sexual activity: Not on file  Other Topics Concern  . Not on file  Social History Narrative   Married, 2 children.     Outpatient Encounter Medications as of 11/02/2018  Medication Sig  . albuterol (PROAIR HFA) 108 (90 Base) MCG/ACT inhaler ProAir HFA 90 mcg/actuation aerosol inhaler  . levonorgestrel (MIRENA,  52 MG,) 20 MCG/24HR IUD Mirena 20 mcg/24 hr (5 years) intrauterine device  Take 1 insert by intrauterine route.  . rizatriptan (MAXALT-MLT) 10 MG disintegrating tablet Take 1 tablet (10 mg total) by mouth as needed for migraine. May repeat in 2 hours if needed  . VYVANSE 10 MG capsule   . VYVANSE 30 MG capsule   . omeprazole (PRILOSEC) 20 MG capsule Take 1 capsule (20 mg total) by mouth daily for 30 days.  . ondansetron (ZOFRAN ODT) 4 MG disintegrating tablet Take 1 tablet (4 mg total) by mouth every 8 (eight) hours as needed for nausea or vomiting.  Marland Kitchen scopolamine (TRANSDERM-SCOP, 1.5 MG,) 1 MG/3DAYS Place 1 patch (1.5 mg total) onto the skin every 3 (three) days.  . [DISCONTINUED] amoxicillin-clavulanate (AUGMENTIN) 875-125 MG tablet Take 1 tablet by mouth 2 (two) times daily.  . [DISCONTINUED] fluticasone (FLONASE) 50 MCG/ACT nasal spray Place 2 sprays into both nostrils daily.  . [DISCONTINUED] mupirocin ointment (BACTROBAN) 2 % AAA twice daily for 10 days. Apply small amount to clean dry skin.  . [DISCONTINUED] terbinafine (LAMISIL) 250 MG tablet Take 1 tablet (250 mg total) by mouth daily. x7-14.   No facility-administered encounter medications on file as of 11/02/2018.     Allergies  Allergen Reactions  . Prozac [Fluoxetine Hcl] Nausea Only  . Latex Rash    Review of Systems  Constitutional: Negative for chills, fatigue and fever.  HENT: Negative for sore throat, trouble swallowing and voice change.   Respiratory: Negative for cough, choking, chest tightness and shortness of breath.   Cardiovascular: Negative for chest pain and palpitations.  Gastrointestinal: Positive for abdominal pain. Negative for abdominal distention, anal bleeding, blood in stool, constipation, diarrhea, nausea, rectal pain and vomiting.  Genitourinary: Negative.   Musculoskeletal: Negative for back pain and myalgias.  Skin: Negative for color change.  Neurological: Positive for headaches. Negative for  dizziness, weakness and light-headedness.  Hematological: Negative for adenopathy. Does not bruise/bleed easily.  Psychiatric/Behavioral: Negative for confusion.  All other systems reviewed and are negative.       Objective:    BP 114/79   Pulse 79   Temp 98.2 F (36.8 C) (Oral)   Ht 5\' 2"  (1.575 m)   Wt 161 lb (73 kg)   BMI 29.45 kg/m    Wt Readings from Last 3 Encounters:  11/02/18 161 lb (73 kg)  10/03/18 157 lb 9.6 oz (71.5 kg)  08/22/18 161 lb 12.8 oz (73.4 kg)    Physical Exam Vitals signs and nursing note reviewed.  Constitutional:      General: She is not in acute distress.  Appearance: Normal appearance. She is well-developed and well-groomed.  HENT:     Head: Normocephalic and atraumatic.     Mouth/Throat:     Mouth: Mucous membranes are moist.     Pharynx: Oropharynx is clear.  Eyes:     Extraocular Movements: Extraocular movements intact.     Pupils: Pupils are equal, round, and reactive to light.  Neck:     Musculoskeletal: Normal range of motion and neck supple.  Cardiovascular:     Rate and Rhythm: Normal rate and regular rhythm.     Heart sounds: No murmur. No friction rub. No gallop.   Pulmonary:     Effort: Pulmonary effort is normal. No respiratory distress.     Breath sounds: Normal breath sounds.  Abdominal:     General: Abdomen is flat. Bowel sounds are normal.     Palpations: Abdomen is soft.     Tenderness: There is no abdominal tenderness.  Skin:    General: Skin is warm and dry.     Capillary Refill: Capillary refill takes less than 2 seconds.  Neurological:     General: No focal deficit present.     Mental Status: She is alert and oriented to person, place, and time.  Psychiatric:        Mood and Affect: Mood normal.        Behavior: Behavior normal. Behavior is cooperative.        Thought Content: Thought content normal.        Judgment: Judgment normal.     Results for orders placed or performed in visit on 03/18/18    QuantiFERON-TB Gold Plus  Result Value Ref Range   QuantiFERON Incubation Incubation performed.    QuantiFERON Criteria Comment    QuantiFERON TB1 Ag Value 0.02 IU/mL   QuantiFERON TB2 Ag Value 0.02 IU/mL   QuantiFERON Nil Value 0.02 IU/mL   QuantiFERON Mitogen Value >10.00 IU/mL   QuantiFERON-TB Gold Plus Negative Negative       Pertinent labs & imaging results that were available during my care of the patient were reviewed by me and considered in my medical decision making.  Assessment & Plan:  Zafira was seen today for medication refill and abdominal pain.  Diagnoses and all orders for this visit:  Gastroesophageal reflux disease without esophagitis Avoid spicy and greasy foods. Try to avoid caffeine, alcohol, NSAIDs, or tobacco. Medications as prescribed. Report any new or worsening symptoms.  -     omeprazole (PRILOSEC) 20 MG capsule; Take 1 capsule (20 mg total) by mouth daily for 30 days.  H/O motion sickness Pt planning a cruise and would like preventative medications. She has had significant motion sickness in the past. Pt aware of proper use of scopolamine patches. Pt aware to avoid contact with eyes after placing the patch.  -     scopolamine (TRANSDERM-SCOP, 1.5 MG,) 1 MG/3DAYS; Place 1 patch (1.5 mg total) onto the skin every 3 (three) days. -     ondansetron (ZOFRAN ODT) 4 MG disintegrating tablet; Take 1 tablet (4 mg total) by mouth every 8 (eight) hours as needed for nausea or vomiting.     Continue all other maintenance medications.  Follow up plan: Return in about 3 months (around 01/31/2019).  Educational handout given for GERD  The above assessment and management plan was discussed with the patient. The patient verbalized understanding of and has agreed to the management plan. Patient is aware to call the clinic if symptoms persist or worsen. Patient  is aware when to return to the clinic for a follow-up visit. Patient educated on when it is appropriate to go  to the emergency department.   Monia Pouch, FNP-C [facility] [phone]

## 2018-11-02 NOTE — Patient Instructions (Signed)

## 2018-12-07 MED FILL — VYVANSE 40 MG CAPSULE: 40 | 30 days supply | Qty: 30 | Fill #0

## 2018-12-27 ENCOUNTER — Telehealth: Payer: 59 | Admitting: Nurse Practitioner

## 2018-12-27 DIAGNOSIS — Z20828 Contact with and (suspected) exposure to other viral communicable diseases: Secondary | ICD-10-CM

## 2018-12-27 DIAGNOSIS — Z20822 Contact with and (suspected) exposure to covid-19: Secondary | ICD-10-CM

## 2018-12-27 NOTE — Progress Notes (Signed)
E-Visit for Corona Virus Screening  Based on your current symptoms, you may very well have the virus, however your symptoms are mild. Currently, not all patients are being tested. If the symptoms are mild and there is not a known exposure, performing the test is not indicated.   You cannot be cleared form work until you no longer have symptoms.  Coronavirus disease 2019 (COVID-19) is a respiratory illness that can spread from person to person. The virus that causes COVID-19 is a new virus that was first identified in the country of [place] but is now found in multiple other countries and has spread to the [place].  Symptoms associated with the virus are mild to severe fever, cough, and shortness of breath. There is currently no vaccine to protect against COVID-19, and there is no specific antiviral treatment for the virus.   To be considered HIGH RISK for Coronavirus (COVID-19), you have to meet the following criteria:  . Traveled to [place], [place], [place], [place] or [place]; or in the [place] to [place], [place], [place], or [place]; and have fever, cough, and shortness of breath within the last 2 weeks of travel OR  . Been in close contact with a person diagnosed with COVID-19 within the last 2 weeks and have fever, cough, and shortness of breath  . IF YOU DO NOT MEET THESE CRITERIA, YOU ARE CONSIDERED LOW RISK FOR COVID-19.   It is vitally important that if you feel that you have an infection such as this virus or any other virus that you stay home and away from places where you may spread it to others.  You should self-quarantine for 14 days if you have symptoms that could potentially be coronavirus and avoid contact with people age 32 and older.   You can use medication such as delsym or mucinex OTC for cough  You may also take acetaminophen (Tylenol) as needed for fever.   Reduce your risk of any infection by using the same precautions used for avoiding the common cold  or flu:  Marland Kitchen Wash your hands often with soap and warm water for at least 20 seconds.  If soap and water are not readily available, use an alcohol-based hand sanitizer with at least 60% alcohol.  . If coughing or sneezing, cover your mouth and nose by coughing or sneezing into the elbow areas of your shirt or coat, into a tissue or into your sleeve (not your hands). . Avoid shaking hands with others and consider head nods or verbal greetings only. . Avoid touching your eyes, nose, or mouth with unwashed hands.  . Avoid close contact with people who are sick. . Avoid places or events with large numbers of people in one location, like concerts or sporting events. . Carefully consider travel plans you have or are making. . If you are planning any travel outside or inside the [place], visit the CDC's Travelers' Health webpage for the latest health notices. . If you have some symptoms but not all symptoms, continue to monitor at home and seek medical attention if your symptoms worsen. . If you are having a medical emergency, call 911.  HOME CARE . Only take medications as instructed by your medical team. . Drink plenty of fluids and get plenty of rest. . A steam or ultrasonic humidifier can help if you have congestion.   GET HELP RIGHT AWAY IF: . You develop worsening fever. . You become short of breath . You cough up  blood. . Your symptoms become more severe MAKE SURE YOU   Understand these instructions.  Will watch your condition.  Will get help right away if you are not doing well or get worse.  Your e-visit answers were reviewed by a board certified advanced clinical practitioner to complete your personal care plan.  Depending on the condition, your plan could have included both over the counter or prescription medications.  If there is a problem please reply once you have received a response from your provider. Your safety is important to [place].  If you have drug allergies check your prescription  carefully.    You can use MyChart to ask questions about today's visit, request a non-urgent call back, or ask for a work or school excuse for 24 hours related to this e-Visit. If it has been greater than 24 hours you will need to follow up with your provider, or enter a new e-Visit to address those concerns. You will get an e-mail in the next two days asking about your experience.  I hope that your e-visit has been valuable and will speed your recovery. Thank you for using e-visits.  5 minutes spent reviewing and documenting in chart.

## 2018-12-28 ENCOUNTER — Encounter: Payer: Self-pay | Admitting: Family Medicine

## 2018-12-28 ENCOUNTER — Ambulatory Visit (INDEPENDENT_AMBULATORY_CARE_PROVIDER_SITE_OTHER): Payer: 59 | Admitting: Family Medicine

## 2018-12-28 ENCOUNTER — Other Ambulatory Visit: Payer: Self-pay

## 2018-12-28 DIAGNOSIS — J301 Allergic rhinitis due to pollen: Secondary | ICD-10-CM | POA: Diagnosis not present

## 2018-12-28 NOTE — Progress Notes (Signed)
Telephone visit  Subjective: CC: f/u rhinorrhea PCP: Baruch Gouty, FNP GEZ:MOQHUT Boom is a 33 y.o. female calls for telephone consult today. Patient provides verbal consent for consult held via phone.  Location of patient: home Location of provider: [facility] Others present for call: none  1. Rhinorrhea Patient reports that on Thursday of last week she developed mild rhinorrhea, which is chronic for her during allergy season.  She had associated chest pressure but states that she was wearing a CAPR all day and wonders if this caused some dryness in her throat.  She never had overt shortness of breath she just noted that her breathing was somewhat different than her baseline.  Of note she also has a history of pots and asthma.  She has not been using her inhaler.  Denies any cough, congestion, fevers.  She has had exposure to folks with COVID-19 but again has always been in full PPE.  She is instructed to stay home because of symptoms.  She was told that she possibly can go back on Wednesday.  She is been asymptomatic since Saturday.  Currently taking Zyrtec for symptoms.   ROS: Per HPI  Allergies  Allergen Reactions  . Prozac [Fluoxetine Hcl] Nausea Only  . Latex Rash   Past Medical History:  Diagnosis Date  . Anxiety   . Asthma   . Concussion   . Depression   . GERD (gastroesophageal reflux disease)   . Headache(784.0)    Migraines  . Herpes   . Keratosis   . Kidney stone   . Nephrolithiasis   . PONV (postoperative nausea and vomiting)    prolonged sedation  . Postural orthostatic tachycardia syndrome   . Tingling in extremities    occ    Current Outpatient Medications:  .  albuterol (PROAIR HFA) 108 (90 Base) MCG/ACT inhaler, ProAir HFA 90 mcg/actuation aerosol inhaler, Disp: , Rfl:  .  levonorgestrel (MIRENA, 52 MG,) 20 MCG/24HR IUD, Mirena 20 mcg/24 hr (5 years) intrauterine device  Take 1 insert by intrauterine route., Disp: , Rfl:  .  omeprazole (PRILOSEC) 20 MG  capsule, Take 1 capsule (20 mg total) by mouth daily for 30 days., Disp: 30 capsule, Rfl: 3 .  ondansetron (ZOFRAN ODT) 4 MG disintegrating tablet, Take 1 tablet (4 mg total) by mouth every 8 (eight) hours as needed for nausea or vomiting., Disp: 20 tablet, Rfl: 0 .  rizatriptan (MAXALT-MLT) 10 MG disintegrating tablet, Take 1 tablet (10 mg total) by mouth as needed for migraine. May repeat in 2 hours if needed, Disp: 10 tablet, Rfl: 1 .  scopolamine (TRANSDERM-SCOP, 1.5 MG,) 1 MG/3DAYS, Place 1 patch (1.5 mg total) onto the skin every 3 (three) days., Disp: 10 patch, Rfl: 12 .  VYVANSE 10 MG capsule, , Disp: , Rfl:  .  VYVANSE 30 MG capsule, , Disp: , Rfl: 0  Assessment/ Plan: 33 y.o. female   1. Seasonal allergic rhinitis due to pollen I agree that the symptoms are likely from seasonal allergic rhinitis.  She is not demonstrating any signs or symptoms that are suggestive of COVID-19 infection at this time, particularly since symptoms have resolved.  I do agree because she has had positive contacts that it is appropriate for her to stay out the full 72 hours.  She may return as long as she has been completely asymptomatic during those 72 hours.  I will place a note in her chart that she may print if needed to return to work on Wednesday.  She is very well aware of signs and symptoms of COVID-19 and reasons to seek medical evaluation.  She will follow-up PRN.   Start time: 12:34pm End time: 12:42pm  No orders of the defined types were placed in this encounter.   Janora Norlander, DO [facility] [phone]

## 2018-12-30 ENCOUNTER — Other Ambulatory Visit: Payer: Self-pay | Admitting: *Deleted

## 2018-12-30 ENCOUNTER — Encounter: Payer: Self-pay | Admitting: Family Medicine

## 2018-12-30 MED ORDER — ALBUTEROL SULFATE HFA 108 (90 BASE) MCG/ACT IN AERS: INHALATION_SPRAY | RESPIRATORY_TRACT | 6 refills | Status: DC

## 2019-01-05 MED FILL — OMEPRAZOLE 20 MG CPDR: 20 | 30 days supply | Qty: 30 | Fill #1

## 2019-01-28 DIAGNOSIS — F902 Attention-deficit hyperactivity disorder, combined type: Secondary | ICD-10-CM | POA: Diagnosis not present

## 2019-01-28 MED FILL — VYVANSE 40 MG CAPSULE: 40 | 30 days supply | Qty: 30 | Fill #0

## 2019-01-30 MED FILL — OMEPRAZOLE 20 MG CPDR: 20 | 30 days supply | Qty: 30 | Fill #2

## 2019-02-25 MED FILL — VYVANSE 10 MG CAPSULE: 10 | 30 days supply | Qty: 30 | Fill #0

## 2019-02-25 MED FILL — VYVANSE 40 MG CAPSULE: 40 | 30 days supply | Qty: 30 | Fill #0

## 2019-03-02 MED FILL — ALBUTEROL SULFATE HFA 108 (: 108 (90 BAS | 20 days supply | Qty: 9 | Fill #0

## 2019-03-02 MED FILL — valACYclovir HCL 1 GM TABS: 1 | 14 days supply | Qty: 14 | Fill #0

## 2019-03-04 DIAGNOSIS — L292 Pruritus vulvae: Secondary | ICD-10-CM | POA: Diagnosis not present

## 2019-03-04 DIAGNOSIS — B373 Candidiasis of vulva and vagina: Secondary | ICD-10-CM | POA: Diagnosis not present

## 2019-03-05 MED FILL — NYSTATIN-TRIAMCINOLONE CRM: 100000-0.1 | 30 days supply | Qty: 30 | Fill #0

## 2019-03-05 MED FILL — FLUCONAZOLE 150 MG TABS: 150 | 4 days supply | Qty: 2 | Fill #0

## 2019-03-16 DIAGNOSIS — N9089 Other specified noninflammatory disorders of vulva and perineum: Secondary | ICD-10-CM | POA: Diagnosis not present

## 2019-03-16 DIAGNOSIS — N898 Other specified noninflammatory disorders of vagina: Secondary | ICD-10-CM | POA: Diagnosis not present

## 2019-04-13 MED FILL — VYVANSE 40 MG CAPSULE: 40 | 30 days supply | Qty: 30 | Fill #0

## 2019-04-30 ENCOUNTER — Encounter: Payer: Self-pay | Admitting: Family Medicine

## 2019-05-07 ENCOUNTER — Ambulatory Visit (INDEPENDENT_AMBULATORY_CARE_PROVIDER_SITE_OTHER): Payer: 59 | Admitting: Family

## 2019-05-07 ENCOUNTER — Encounter: Payer: Self-pay | Admitting: Family

## 2019-05-07 ENCOUNTER — Other Ambulatory Visit: Payer: Self-pay

## 2019-05-07 DIAGNOSIS — B373 Candidiasis of vulva and vagina: Secondary | ICD-10-CM

## 2019-05-07 DIAGNOSIS — T7840XD Allergy, unspecified, subsequent encounter: Secondary | ICD-10-CM | POA: Diagnosis not present

## 2019-05-07 DIAGNOSIS — B3731 Acute candidiasis of vulva and vagina: Secondary | ICD-10-CM

## 2019-05-07 MED ORDER — CLOBETASOL PROPIONATE 0.05 % EX CREA: 1.0000 "application " | TOPICAL_CREAM | Freq: Two times a day (BID) | CUTANEOUS | 0 refills | Status: DC

## 2019-05-07 MED ORDER — FLUCONAZOLE 150 MG PO TABS: 150.0000 mg | ORAL_TABLET | ORAL | 0 refills | Status: DC | PRN

## 2019-05-07 MED ORDER — HYDROXYZINE PAMOATE 25 MG PO CAPS: 25.0000 mg | ORAL_CAPSULE | Freq: Three times a day (TID) | ORAL | 0 refills | Status: DC | PRN

## 2019-05-07 NOTE — Progress Notes (Signed)
Virtual Visit via telephone Note Due to COVID-19 pandemic this visit was conducted virtually. This visit type was conducted due to national recommendations for restrictions regarding the COVID-19 Pandemic (e.g. social distancing, sheltering in place) in an effort to limit this patient's exposure and mitigate transmission in our community. All issues noted in this document were discussed and addressed.  A physical exam was not performed with this format.  I connected with [name] on 05/07/19 at 12:10 pm by telephone and verified that I am speaking with the correct person using two identifiers. [name] is currently located at home  and no one is currently with her during visit. The provider, [name], FNP is located in their office at time of visit.  I discussed the limitations, risks, security and privacy concerns of performing an evaluation and management service by telephone and the availability of in person appointments. I also discussed with the patient that there may be a patient responsible charge related to this service. The patient expressed understanding and agreed to proceed.   History and Present Illness:  Pt calls the office today with a recurrent rash in her right thigh and labia. She states she had something similar occur last fall and was treated for "ringworm" with antifungal cream. She states the area did improve. However, the bumps and irration on her labia recurs and can last for several hours with intense itching. She has not changed any of her clothing, laundry detergent, soaps, or lotions.   She states she saw her GYN about this, but by the time she had seen her in the office the area had greatly improved. She did have pictures, but was told it looks more allergic. She was told it was not STD related. She is very frustrated as this is very irritating. She has tried OTC hydrocortisone cream with relief.  Rash This is a new problem. The current episode started  more than 1 month ago. The problem has been waxing and waning since onset. Location: right labia. The rash is characterized by pain, redness, swelling and itchiness. She was exposed to nothing. Pertinent negatives include no shortness of breath or sore throat.  Vaginal Discharge The patient's primary symptoms include genital itching and vaginal discharge. The current episode started 1 to 4 weeks ago. The problem occurs intermittently. Associated symptoms include rash. Pertinent negatives include no sore throat. The vaginal discharge was white and thick.      Review of Systems  HENT: Negative for sore throat.   Respiratory: Negative for shortness of breath.   Genitourinary: Positive for vaginal discharge.  Skin: Positive for rash.     Observations/Objective: No SOB or distress noted, patient did send several pictures of the area. On her right thigh there are 3 hive like spots. Her right labia is swollen and atrophy present.    Assessment and Plan: 1. Allergic reaction, subsequent encounter Keep Derm appt Places need to be biopsied  to confirm allergic  Keep follow up with Derm Start daily zyrtec - hydrOXYzine (VISTARIL) 25 MG capsule; Take 1 capsule (25 mg total) by mouth 3 (three) times daily as needed.  Dispense: 30 capsule; Refill: 0 - clobetasol cream (TEMOVATE) 0.05 %; Apply 1 application topically 2 (two) times daily.  Dispense: 30 g; Refill: 0  2. Vagina, candidiasis Keep clean and dry Cotton underwear Start probiotic  - fluconazole (DIFLUCAN) 150 MG tablet; Take 1 tablet (150 mg total) by mouth every three (3) days as needed.  Dispense: 3 tablet; Refill:  0      I discussed the assessment and treatment plan with the patient. The patient was provided an opportunity to ask questions and all were answered. The patient agreed with the plan and demonstrated an understanding of the instructions.   The patient was advised to call back or seek an in-person evaluation if the  symptoms worsen or if the condition fails to improve as anticipated.  The above assessment and management plan was discussed with the patient. The patient verbalized understanding of and has agreed to the management plan. Patient is aware to call the clinic if symptoms persist or worsen. Patient is aware when to return to the clinic for a follow-up visit. Patient educated on when it is appropriate to go to the emergency department.   Time call ended:  12:45 pm   I provided 35 minutes of non-face-to-face time during this encounter.    [name], FNP

## 2019-05-17 DIAGNOSIS — B356 Tinea cruris: Secondary | ICD-10-CM | POA: Diagnosis not present

## 2019-05-17 DIAGNOSIS — L503 Dermatographic urticaria: Secondary | ICD-10-CM | POA: Diagnosis not present

## 2019-05-17 DIAGNOSIS — L258 Unspecified contact dermatitis due to other agents: Secondary | ICD-10-CM | POA: Diagnosis not present

## 2019-05-20 DIAGNOSIS — L308 Other specified dermatitis: Secondary | ICD-10-CM | POA: Diagnosis not present

## 2019-05-20 DIAGNOSIS — L700 Acne vulgaris: Secondary | ICD-10-CM | POA: Diagnosis not present

## 2019-05-20 DIAGNOSIS — L708 Other acne: Secondary | ICD-10-CM | POA: Diagnosis not present

## 2019-05-27 DIAGNOSIS — F902 Attention-deficit hyperactivity disorder, combined type: Secondary | ICD-10-CM | POA: Diagnosis not present

## 2019-05-27 DIAGNOSIS — Z79899 Other long term (current) drug therapy: Secondary | ICD-10-CM | POA: Diagnosis not present

## 2019-05-27 DIAGNOSIS — F419 Anxiety disorder, unspecified: Secondary | ICD-10-CM | POA: Diagnosis not present

## 2019-05-27 MED FILL — VYVANSE 30 MG CAPSULE: 30 | 30 days supply | Qty: 30 | Fill #0

## 2019-06-02 DIAGNOSIS — Z01419 Encounter for gynecological examination (general) (routine) without abnormal findings: Secondary | ICD-10-CM | POA: Diagnosis not present

## 2019-06-02 DIAGNOSIS — Z1389 Encounter for screening for other disorder: Secondary | ICD-10-CM | POA: Diagnosis not present

## 2019-06-02 DIAGNOSIS — N909 Noninflammatory disorder of vulva and perineum, unspecified: Secondary | ICD-10-CM | POA: Diagnosis not present

## 2019-06-02 DIAGNOSIS — Z30431 Encounter for routine checking of intrauterine contraceptive device: Secondary | ICD-10-CM | POA: Diagnosis not present

## 2019-06-02 DIAGNOSIS — Z130 Encounter for screening for diseases of the blood and blood-forming organs and certain disorders involving the immune mechanism: Secondary | ICD-10-CM | POA: Diagnosis not present

## 2019-06-02 DIAGNOSIS — Z6829 Body mass index (BMI) 29.0-29.9, adult: Secondary | ICD-10-CM | POA: Diagnosis not present

## 2019-06-02 DIAGNOSIS — Z113 Encounter for screening for infections with a predominantly sexual mode of transmission: Secondary | ICD-10-CM | POA: Diagnosis not present

## 2019-06-02 DIAGNOSIS — B009 Herpesviral infection, unspecified: Secondary | ICD-10-CM | POA: Diagnosis not present

## 2019-06-02 DIAGNOSIS — R21 Rash and other nonspecific skin eruption: Secondary | ICD-10-CM | POA: Diagnosis not present

## 2019-06-21 ENCOUNTER — Ambulatory Visit (INDEPENDENT_AMBULATORY_CARE_PROVIDER_SITE_OTHER): Payer: 59 | Admitting: Family

## 2019-06-21 ENCOUNTER — Other Ambulatory Visit: Payer: Self-pay

## 2019-06-21 ENCOUNTER — Encounter: Payer: Self-pay | Admitting: Family

## 2019-06-21 VITALS — BP 120/81 | HR 96 | Temp 98.2°F | Resp 20 | Ht 62.0 in | Wt 163.4 lb

## 2019-06-21 DIAGNOSIS — Z0001 Encounter for general adult medical examination with abnormal findings: Secondary | ICD-10-CM

## 2019-06-21 DIAGNOSIS — R21 Rash and other nonspecific skin eruption: Secondary | ICD-10-CM

## 2019-06-21 DIAGNOSIS — R5383 Other fatigue: Secondary | ICD-10-CM

## 2019-06-21 DIAGNOSIS — Z6833 Body mass index (BMI) 33.0-33.9, adult: Secondary | ICD-10-CM

## 2019-06-21 DIAGNOSIS — E663 Overweight: Secondary | ICD-10-CM

## 2019-06-21 DIAGNOSIS — J4520 Mild intermittent asthma, uncomplicated: Secondary | ICD-10-CM | POA: Diagnosis not present

## 2019-06-21 DIAGNOSIS — N898 Other specified noninflammatory disorders of vagina: Secondary | ICD-10-CM

## 2019-06-21 DIAGNOSIS — Z0000 Encounter for general adult medical examination without abnormal findings: Secondary | ICD-10-CM

## 2019-06-21 DIAGNOSIS — K219 Gastro-esophageal reflux disease without esophagitis: Secondary | ICD-10-CM | POA: Diagnosis not present

## 2019-06-21 NOTE — Patient Instructions (Signed)
Health Maintenance, Female Adopting a healthy lifestyle and getting preventive care are important in promoting health and wellness. Ask your health care provider about:  The right schedule for you to have regular tests and exams.  Things you can do on your own to prevent diseases and keep yourself healthy. What should I know about diet, weight, and exercise? Eat a healthy diet   Eat a diet that includes plenty of vegetables, fruits, low-fat dairy products, and lean protein.  Do not eat a lot of foods that are high in solid fats, added sugars, or sodium. Maintain a healthy weight Body mass index (BMI) is used to identify weight problems. It estimates body fat based on height and weight. Your health care provider can help determine your BMI and help you achieve or maintain a healthy weight. Get regular exercise Get regular exercise. This is one of the most important things you can do for your health. Most adults should:  Exercise for at least 150 minutes each week. The exercise should increase your heart rate and make you sweat (moderate-intensity exercise).  Do strengthening exercises at least twice a week. This is in addition to the moderate-intensity exercise.  Spend less time sitting. Even light physical activity can be beneficial. Watch cholesterol and blood lipids Have your blood tested for lipids and cholesterol at 32 years of age, then have this test every 5 years. Have your cholesterol levels checked more often if:  Your lipid or cholesterol levels are high.  You are older than 32 years of age.  You are at high risk for heart disease. What should I know about cancer screening? Depending on your health history and family history, you may need to have cancer screening at various ages. This may include screening for:  Breast cancer.  Cervical cancer.  Colorectal cancer.  Skin cancer.  Lung cancer. What should I know about heart disease, diabetes, and high blood  pressure? Blood pressure and heart disease  High blood pressure causes heart disease and increases the risk of stroke. This is more likely to develop in people who have high blood pressure readings, are of African descent, or are overweight.  Have your blood pressure checked: ? Every 3-5 years if you are 18-39 years of age. ? Every year if you are 40 years old or older. Diabetes Have regular diabetes screenings. This checks your fasting blood sugar level. Have the screening done:  Once every three years after age 40 if you are at a normal weight and have a low risk for diabetes.  More often and at a younger age if you are overweight or have a high risk for diabetes. What should I know about preventing infection? Hepatitis B If you have a higher risk for hepatitis B, you should be screened for this virus. Talk with your health care provider to find out if you are at risk for hepatitis B infection. Hepatitis C Testing is recommended for:  Everyone born from 1945 through 1965.  Anyone with known risk factors for hepatitis C. Sexually transmitted infections (STIs)  Get screened for STIs, including gonorrhea and chlamydia, if: ? You are sexually active and are younger than 32 years of age. ? You are older than 32 years of age and your health care provider tells you that you are at risk for this type of infection. ? Your sexual activity has changed since you were last screened, and you are at increased risk for chlamydia or gonorrhea. Ask your health care provider if   you are at risk.  Ask your health care provider about whether you are at high risk for HIV. Your health care provider may recommend a prescription medicine to help prevent HIV infection. If you choose to take medicine to prevent HIV, you should first get tested for HIV. You should then be tested every 3 months for as long as you are taking the medicine. Pregnancy  If you are about to stop having your period (premenopausal) and  you may become pregnant, seek counseling before you get pregnant.  Take 400 to 800 micrograms (mcg) of folic acid every day if you become pregnant.  Ask for birth control (contraception) if you want to prevent pregnancy. Osteoporosis and menopause Osteoporosis is a disease in which the bones lose minerals and strength with aging. This can result in bone fractures. If you are 65 years old or older, or if you are at risk for osteoporosis and fractures, ask your health care provider if you should:  Be screened for bone loss.  Take a calcium or vitamin D supplement to lower your risk of fractures.  Be given hormone replacement therapy (HRT) to treat symptoms of menopause. Follow these instructions at home: Lifestyle  Do not use any products that contain nicotine or tobacco, such as cigarettes, e-cigarettes, and chewing tobacco. If you need help quitting, ask your health care provider.  Do not use street drugs.  Do not share needles.  Ask your health care provider for help if you need support or information about quitting drugs. Alcohol use  Do not drink alcohol if: ? Your health care provider tells you not to drink. ? You are pregnant, may be pregnant, or are planning to become pregnant.  If you drink alcohol: ? Limit how much you use to 0-1 drink a day. ? Limit intake if you are breastfeeding.  Be aware of how much alcohol is in your drink. In the [place] one drink equals one 12 oz bottle of beer (355 mL), one 5 oz glass of wine (148 mL), or one 1 oz glass of hard liquor (44 mL). General instructions  Schedule regular health, dental, and eye exams.  Stay current with your vaccines.  Tell your health care provider if: ? You often feel depressed. ? You have ever been abused or do not feel safe at home. Summary  Adopting a healthy lifestyle and getting preventive care are important in promoting health and wellness.  Follow your health care provider's instructions about healthy  diet, exercising, and getting tested or screened for diseases.  Follow your health care provider's instructions on monitoring your cholesterol and blood pressure. This information is not intended to replace advice given to you by your health care provider. Make sure you discuss any questions you have with your health care provider. Document Released: 04/01/2011 Document Revised: 09/09/2018 Document Reviewed: 09/09/2018 Elsevier Patient Education  2020 [employer]  

## 2019-06-21 NOTE — Progress Notes (Signed)
Subjective:    Patient ID: [name], female    DOB: 1985-10-12, 33 y.o.   MRN: [id number]  Chief Complaint  Patient presents with  . check up and lab work   PT presents to the office today for CPE without pap. She is followed by her GYN annually. She has had a chronic vaginal rash that she has had a biopsy  that was negative for yeast. She was negative for STI"s. She has seen several Dermatologists and can not get answers. She states she was told to follow up with a specialists of vulvar diseases.  She is frustrated because she does not know what is wrong with her.  She is complaining of fatigue.   Asthma There is no cough or wheezing. This is a chronic problem. The current episode started more than 1 year ago. The problem occurs intermittently. The problem has been waxing and waning. Pertinent negatives include no heartburn. Her past medical history is significant for asthma.  Gastroesophageal Reflux She reports no belching, no coughing, no heartburn or no wheezing. This is a chronic problem. The current episode started more than 1 year ago. The problem occurs occasionally. She has tried a PPI for the symptoms. The treatment provided moderate relief.  Vaginal Itching The patient's primary symptoms include genital itching. The patient's pertinent negatives include no vaginal bleeding or vaginal discharge. This is a recurrent problem. The current episode started more than 1 month ago. The problem occurs intermittently. The problem has been waxing and waning. The pain is mild. Pertinent negatives include no back pain. She has tried antifungals for the symptoms. The treatment provided mild relief.      Review of Systems  Respiratory: Negative for cough and wheezing.   Gastrointestinal: Negative for heartburn.  Genitourinary: Negative for vaginal discharge.  Musculoskeletal: Negative for back pain.  All other systems reviewed and are negative.  Family History  Problem Relation Age of  Onset  . Alcohol abuse Father   . Diabetes Father   . Arthritis Maternal Aunt   . Arthritis Maternal Grandmother   . COPD Maternal Grandmother   . Thyroid disease Maternal Grandmother   . Alcohol abuse Maternal Grandfather   . Cancer Maternal Grandfather        liver, pancreatic  . Cancer Paternal Grandmother        brain  . Alcohol abuse Paternal Grandfather   . Stroke Paternal Grandfather   . Heart disease Neg Hx        early heart disease  . Sudden death Neg Hx        cardiac  . Cardiomyopathy Neg Hx     Social History   Socioeconomic History  . Marital status: Married    Spouse name: Not on file  . Number of children: Not on file  . Years of education: Not on file  . Highest education level: Not on file  Occupational History  . Not on file  Social Needs  . Financial resource strain: Not on file  . Food insecurity    Worry: Not on file    Inability: Not on file  . Transportation needs    Medical: Not on file    Non-medical: Not on file  Tobacco Use  . Smoking status: Never Smoker  . Smokeless tobacco: Never Used  Substance and Sexual Activity  . Alcohol use: No    Comment: occas  . Drug use: No  . Sexual activity: Yes    Birth control/protection: I.U.D.  Lifestyle  . Physical activity    Days per week: Not on file    Minutes per session: Not on file  . Stress: Not on file  Relationships  . Social Herbalist on phone: Not on file    Gets together: Not on file    Attends religious service: Not on file    Active member of club or organization: Not on file    Attends meetings of clubs or organizations: Not on file    Relationship status: Not on file  Other Topics Concern  . Not on file  Social History Narrative   Married, 2 children.        Objective:   Physical Exam Vitals signs reviewed.  Constitutional:      General: She is not in acute distress.    Appearance: She is well-developed.  HENT:     Head: Normocephalic and atraumatic.      Right Ear: Tympanic membrane normal.     Left Ear: Tympanic membrane normal.  Eyes:     Pupils: Pupils are equal, round, and reactive to light.  Neck:     Musculoskeletal: Normal range of motion and neck supple.     Thyroid: No thyromegaly.  Cardiovascular:     Rate and Rhythm: Normal rate and regular rhythm.     Heart sounds: Normal heart sounds. No murmur.  Pulmonary:     Effort: Pulmonary effort is normal. No respiratory distress.     Breath sounds: Normal breath sounds. No wheezing.  Abdominal:     General: Bowel sounds are normal. There is no distension.     Palpations: Abdomen is soft.     Tenderness: There is no abdominal tenderness.  Musculoskeletal: Normal range of motion.        General: No tenderness.  Skin:    General: Skin is warm and dry.  Neurological:     Mental Status: She is alert and oriented to person, place, and time.     Cranial Nerves: No cranial nerve deficit.     Deep Tendon Reflexes: Reflexes are normal and symmetric.  Psychiatric:        Behavior: Behavior normal.        Thought Content: Thought content normal.        Judgment: Judgment normal.       BP 120/81   Pulse 96   Temp 98.2 F (36.8 C) (Temporal)   Resp 20   Ht 5' 2"  (1.575 m)   Wt 163 lb 6.4 oz (74.1 kg)   SpO2 99%   BMI 29.89 kg/m      Assessment & Plan:  [name] comes in today with chief complaint of check up and lab work   Diagnosis and orders addressed:  1. Annual physical exam - CMP14+EGFR - Anemia Profile B - ANA, IFA Comprehensive Panel - Lipid panel - TSH - Sedimentation rate - QuantiFERON-TB Gold Plus  2. Mild intermittent chronic asthma without complication - WHQ75+FFMB  3. Overweight (BMI 25.0-29.9) - CMP14+EGFR  4. Vagina itching Will do referral to specialists - CMP14+EGFR - ANA, IFA Comprehensive Panel - Sedimentation rate - Ambulatory referral to Dermatology  5. Rash of genitalia - CMP14+EGFR - ANA, IFA Comprehensive Panel -  Sedimentation rate - Ambulatory referral to Dermatology  6. Fatigue, unspecified type - CMP14+EGFR - Anemia Profile B - ANA, IFA Comprehensive Panel - Lipid panel - TSH - Sedimentation rate  7. Gastroesophageal reflux disease without esophagitis  8. BMI 33.0-33.9,adult  Labs pending Health Maintenance reviewed Diet and exercise encouraged  Follow up plan: 1 year    [name], FNP

## 2019-06-23 ENCOUNTER — Other Ambulatory Visit: Payer: Self-pay | Admitting: Family

## 2019-06-23 DIAGNOSIS — R718 Other abnormality of red blood cells: Secondary | ICD-10-CM

## 2019-06-23 DIAGNOSIS — D582 Other hemoglobinopathies: Secondary | ICD-10-CM

## 2019-06-23 LAB — SEDIMENTATION RATE: Sed Rate: 3 mm/hr (ref 0–32)

## 2019-06-23 LAB — ANEMIA PROFILE B
Basophils Absolute: 0.1 10*3/uL (ref 0.0–0.2)
Basos: 1 %
EOS (ABSOLUTE): 0.1 10*3/uL (ref 0.0–0.4)
Eos: 2 %
Ferritin: 146 ng/mL (ref 15–150)
Folate: 10.7 ng/mL (ref >3.0)
Hematocrit: 50.3 % — ABNORMAL HIGH (ref 34.0–46.6)
Hemoglobin: 16.4 g/dL — ABNORMAL HIGH (ref 11.1–15.9)
Immature Grans (Abs): 0 10*3/uL (ref 0.0–0.1)
Immature Granulocytes: 0 %
Iron Saturation: 39 % (ref 15–55)
Iron: 122 ug/dL (ref 27–159)
Lymphocytes Absolute: 1.9 10*3/uL (ref 0.7–3.1)
Lymphs: 21 %
MCH: 29.4 pg (ref 26.6–33.0)
MCHC: 32.6 g/dL (ref 31.5–35.7)
MCV: 90 fL (ref 79–97)
Monocytes Absolute: 0.5 10*3/uL (ref 0.1–0.9)
Monocytes: 6 %
Neutrophils Absolute: 6.2 10*3/uL (ref 1.4–7.0)
Neutrophils: 70 %
Platelets: 297 10*3/uL (ref 150–450)
RBC: 5.57 x10E6/uL — ABNORMAL HIGH (ref 3.77–5.28)
RDW: 12.1 % (ref 11.7–15.4)
Retic Ct Pct: 1 % (ref 0.6–2.6)
Total Iron Binding Capacity: 310 ug/dL (ref 250–450)
UIBC: 188 ug/dL (ref 131–425)
Vitamin B-12: 566 pg/mL (ref 232–1245)
WBC: 8.8 10*3/uL (ref 3.4–10.8)

## 2019-06-23 LAB — CMP14+EGFR
ALT: 68 IU/L — ABNORMAL HIGH (ref 0–32)
AST: 35 IU/L (ref 0–40)
Albumin/Globulin Ratio: 1.8 (ref 1.2–2.2)
Albumin: 4.8 g/dL (ref 3.8–4.8)
Alkaline Phosphatase: 71 IU/L (ref 39–117)
BUN/Creatinine Ratio: 14 (ref 9–23)
BUN: 12 mg/dL (ref 6–20)
Bilirubin Total: 0.6 mg/dL (ref 0.0–1.2)
CO2: 22 mmol/L (ref 20–29)
Calcium: 9.1 mg/dL (ref 8.7–10.2)
Chloride: 104 mmol/L (ref 96–106)
Creatinine, Ser: 0.84 mg/dL (ref 0.57–1.00)
GFR calc Af Amer: 106 mL/min/{1.73_m2} (ref >59)
GFR calc non Af Amer: 92 mL/min/{1.73_m2} (ref >59)
Globulin, Total: 2.6 g/dL (ref 1.5–4.5)
Glucose: 105 mg/dL — ABNORMAL HIGH (ref 65–99)
Potassium: 4.3 mmol/L (ref 3.5–5.2)
Sodium: 140 mmol/L (ref 134–144)
Total Protein: 7.4 g/dL (ref 6.0–8.5)

## 2019-06-23 LAB — LIPID PANEL
Chol/HDL Ratio: 2.7 ratio (ref 0.0–4.4)
Cholesterol, Total: 136 mg/dL (ref 100–199)
HDL: 51 mg/dL (ref >39)
LDL Chol Calc (NIH): 71 mg/dL (ref 0–99)
Triglycerides: 72 mg/dL (ref 0–149)
VLDL Cholesterol Cal: 14 mg/dL (ref 5–40)

## 2019-06-23 LAB — TSH: TSH: 1.56 u[IU]/mL (ref 0.450–4.500)

## 2019-06-23 LAB — ANA COMPREHENSIVE PANEL
Anti JO-1: <0.2 AI (ref 0.0–0.9)
Centromere Ab Screen: <0.2 AI (ref 0.0–0.9)
Chromatin Ab SerPl-aCnc: <0.2 AI (ref 0.0–0.9)
ENA RNP Ab: <0.2 AI (ref 0.0–0.9)
ENA SM Ab Ser-aCnc: <0.2 AI (ref 0.0–0.9)
ENA SSA (RO) Ab: <0.2 AI (ref 0.0–0.9)
ENA SSB (LA) Ab: <0.2 AI (ref 0.0–0.9)
Scleroderma (Scl-70) (ENA) Antibody, IgG: <0.2 AI (ref 0.0–0.9)
dsDNA Ab: 1 IU/mL (ref 0–9)

## 2019-06-23 LAB — QUANTIFERON-TB GOLD PLUS
QuantiFERON Mitogen Value: 4.4 IU/mL
QuantiFERON Nil Value: 0.02 IU/mL
QuantiFERON TB1 Ag Value: 0.02 IU/mL
QuantiFERON TB2 Ag Value: 0.02 IU/mL
QuantiFERON-TB Gold Plus: NEGATIVE

## 2019-07-07 MED FILL — VYVANSE 30 MG CAPSULE: 30 | 30 days supply | Qty: 30 | Fill #0

## 2019-07-12 ENCOUNTER — Encounter (HOSPITAL_COMMUNITY): Payer: Self-pay | Admitting: *Deleted

## 2019-07-13 ENCOUNTER — Other Ambulatory Visit: Payer: Self-pay

## 2019-07-13 ENCOUNTER — Encounter (HOSPITAL_COMMUNITY): Payer: Self-pay | Admitting: Hematology

## 2019-07-13 ENCOUNTER — Inpatient Hospital Stay (HOSPITAL_COMMUNITY): Payer: 59

## 2019-07-13 ENCOUNTER — Inpatient Hospital Stay (HOSPITAL_COMMUNITY): Payer: 59 | Attending: Hematology | Admitting: Hematology

## 2019-07-13 DIAGNOSIS — Z803 Family history of malignant neoplasm of breast: Secondary | ICD-10-CM | POA: Diagnosis not present

## 2019-07-13 DIAGNOSIS — R0789 Other chest pain: Secondary | ICD-10-CM | POA: Insufficient documentation

## 2019-07-13 DIAGNOSIS — Z79899 Other long term (current) drug therapy: Secondary | ICD-10-CM | POA: Diagnosis not present

## 2019-07-13 DIAGNOSIS — D751 Secondary polycythemia: Secondary | ICD-10-CM | POA: Diagnosis not present

## 2019-07-13 LAB — CBC WITH DIFFERENTIAL/PLATELET
Abs Immature Granulocytes: 0.02 10*3/uL (ref 0.00–0.07)
Basophils Absolute: 0.1 10*3/uL (ref 0.0–0.1)
Basophils Relative: 1 %
Eosinophils Absolute: 0.1 10*3/uL (ref 0.0–0.5)
Eosinophils Relative: 1 %
HCT: 47.7 % — ABNORMAL HIGH (ref 36.0–46.0)
Hemoglobin: 15.7 g/dL — ABNORMAL HIGH (ref 12.0–15.0)
Immature Granulocytes: 0 %
Lymphocytes Relative: 19 %
Lymphs Abs: 2.1 10*3/uL (ref 0.7–4.0)
MCH: 29.3 pg (ref 26.0–34.0)
MCHC: 32.9 g/dL (ref 30.0–36.0)
MCV: 89.2 fL (ref 80.0–100.0)
Monocytes Absolute: 0.6 10*3/uL (ref 0.1–1.0)
Monocytes Relative: 5 %
Neutro Abs: 8.5 10*3/uL — ABNORMAL HIGH (ref 1.7–7.7)
Neutrophils Relative %: 74 %
Platelets: 297 10*3/uL (ref 150–400)
RBC: 5.35 MIL/uL — ABNORMAL HIGH (ref 3.87–5.11)
RDW: 12.2 % (ref 11.5–15.5)
WBC: 11.5 10*3/uL — ABNORMAL HIGH (ref 4.0–10.5)
nRBC: 0 % (ref 0.0–0.2)

## 2019-07-13 LAB — URINALYSIS, ROUTINE W REFLEX MICROSCOPIC
Bilirubin Urine: NEGATIVE
Glucose, UA: NEGATIVE mg/dL
Hgb urine dipstick: NEGATIVE
Ketones, ur: NEGATIVE mg/dL
Leukocytes,Ua: NEGATIVE
Nitrite: NEGATIVE
Protein, ur: NEGATIVE mg/dL
Specific Gravity, Urine: 1.013 (ref 1.005–1.030)
pH: 7 (ref 5.0–8.0)

## 2019-07-13 NOTE — Patient Instructions (Signed)
[facility] at [facility] Discharge Instructions  You were seen today by Dr. Katragadda. He went over your history, family history and how you've been feeling lately. He will have blood drawn today. He will see you back in 4 weeks for follow up.   Thank you for choosing [facility] at [facility] to provide your oncology and hematology care.  To afford each patient quality time with our provider, please arrive at least 15 minutes before your scheduled appointment time.   If you have a lab appointment with the Cancer Center please come in thru the  Main Entrance and check in at the main information desk  You need to re-schedule your appointment should you arrive 10 or more minutes late.  We strive to give you quality time with our providers, and arriving late affects you and other patients whose appointments are after yours.  Also, if you no show three or more times for appointments you may be dismissed from the clinic at the providers discretion.     Again, thank you for choosing [facility].  Our hope is that these requests will decrease the amount of time that you wait before being seen by our physicians.       _____________________________________________________________  Should you have questions after your visit to [facility], please contact our office at [phone] between the hours of 8:00 a.m. and 4:30 p.m.  Voicemails left after 4:00 p.m. will not be returned until the following business day.  For prescription refill requests, have your pharmacy contact our office and allow 72 hours.    Cancer Center Support Programs:   > Cancer Support Group  2nd Tuesday of the month 1pm-2pm, Journey Room    

## 2019-07-13 NOTE — Assessment & Plan Note (Addendum)
1.  Erythrocytosis: - CBC on 06/21/2019 showed hemoglobin of 16.4, hematocrit of 50.3.  This has increased from labs on 06/04/2017. -He denies any prior history of thrombosis.  No signs of erythromelalgia.  No aquagenic pruritus. -Occasional tingling and numbness in the fingertips, which he attributes to pinched nerve in the back. - She does report chest discomfort in the center and towards the left when she lies down from standing position. -She is a never smoker.  No secondhand smoke exposure at home. -Recent connective tissue disorder work-up was negative. -Ultrasound of the abdomen from September 2018 reviewed by me did not show any splenomegaly. -Denies any fevers, night sweats or weight loss in the last 6 months. - I have recommended repeating CBC today.  If the hemoglobin is elevated, we will send for Jak 2 V6 63F and other mutation reflex testing.  We will check her UA for hematuria.  We will also send a erythropoietin level. -She will come back in 4 weeks to discuss results.  2.  Family history: -No family history of polycythemia. -Maternal grandmother had breast cancer.  Paternal grandmother had brain tumor.  Maternal grandfather had pancreatic cancer.

## 2019-07-13 NOTE — Progress Notes (Signed)
CONSULT NOTE  Patient Care Team: Sharion Balloon, FNP as PCP - General (Family Medicine) Ronnald Collum, MD (Inactive) (Family Medicine)  CHIEF COMPLAINTS/PURPOSE OF CONSULTATION:  Erythrocytosis.  HISTORY OF PRESENTING ILLNESS:  [name] 33 y.o. female is seen in consultation today for further work-up and management of erythrocytosis.  Her most recent CBC on 06/21/2019 showed hemoglobin of 16.4 and hematocrit of 50.3.  White count and platelet count was normal.  Her hemoglobin was 15.5 in September 2018 with hematocrit of 46.  She denies any symptoms of erythromelalgia.  No aquagenic pruritus or other vasomotor symptoms.  No prior history of thrombosis.  Denies any fevers, night sweats or weight loss in the last 6 months.  Reports occasional tingling or numbness in the fingertips, attributes to pinched nerves in the back.  She does report some chest pain described as pressure sensation on the left side of the chest and in the central chest only when she lies down from standing position.  She also reported some ankle swellings when she was recently on a cruise.  She was reportedly diagnosed with POTS and has been eating foods rich in sodium and drinking lots of Gatorade.  Appetite is 100%.  Energy levels are 75%.  She lives at home with her husband and children.  She works as a Health visitor at [employer].  Family history significant for maternal grandmother with breast cancer and maternal grandfather with pancreatic cancer.  Paternal grandmother had brain tumor.  No family history of people requiring phlebotomies.    MEDICAL HISTORY:  Past Medical History:  Diagnosis Date  . Anxiety   . Asthma   . Concussion   . Depression   . GERD (gastroesophageal reflux disease)   . Headache(784.0)    Migraines  . Herpes   . Keratosis   . Kidney stone   . Nephrolithiasis   . PONV (postoperative nausea and vomiting)    prolonged sedation  . Postural orthostatic tachycardia  syndrome   . Tingling in extremities    occ    SURGICAL HISTORY: Past Surgical History:  Procedure Laterality Date  . BLADDER SUSPENSION N/A 05/01/2016   Procedure: TRANSVAGINAL TAPE (TVT) PROCEDURE;  Surgeon: Cheri Fowler, MD;  Location: [facility];  Service: Gynecology;  Laterality: N/A;  . BREAST ENHANCEMENT SURGERY    . CYSTOCELE REPAIR N/A 05/01/2016   Procedure: ANTERIOR REPAIR (CYSTOCELE);  Surgeon: Cheri Fowler, MD;  Location: [facility];  Service: Gynecology;  Laterality: N/A;  . NO PAST SURGERIES      SOCIAL HISTORY: Social History   Socioeconomic History  . Marital status: Married    Spouse name: Jeneen Rinks  . Number of children: Not on file  . Years of education: 3  . Highest education level: Not on file  Occupational History  . Not on file  Social Needs  . Financial resource strain: Not on file  . Food insecurity    Worry: Not on file    Inability: Not on file  . Transportation needs    Medical: Not on file    Non-medical: Not on file  Tobacco Use  . Smoking status: Never Smoker  . Smokeless tobacco: Never Used  Substance and Sexual Activity  . Alcohol use: No    Comment: occas  . Drug use: No  . Sexual activity: Yes    Birth control/protection: I.U.D.  Lifestyle  . Physical activity    Days per week: Not on file    Minutes per session:  Not on file  . Stress: Not on file  Relationships  . Social Herbalist on phone: Not on file    Gets together: Not on file    Attends religious service: Not on file    Active member of club or organization: Not on file    Attends meetings of clubs or organizations: Not on file    Relationship status: Not on file  . Intimate partner violence    Fear of current or ex partner: Not on file    Emotionally abused: Not on file    Physically abused: Not on file    Forced sexual activity: Not on file  Other Topics Concern  . Not on file  Social History Narrative   Married, 2 children.     FAMILY HISTORY: Family  History  Problem Relation Age of Onset  . Alcohol abuse Father   . Diabetes Father   . Arthritis Maternal Aunt   . Arthritis Maternal Grandmother   . COPD Maternal Grandmother   . Thyroid disease Maternal Grandmother   . Alcohol abuse Maternal Grandfather   . Cancer Maternal Grandfather        liver, pancreatic  . Cancer Paternal Grandmother        brain  . Alcohol abuse Paternal Grandfather   . Stroke Paternal Grandfather   . Heart disease Neg Hx        early heart disease  . Sudden death Neg Hx        cardiac  . Cardiomyopathy Neg Hx     ALLERGIES:  is allergic to prozac [fluoxetine hcl] and latex.  MEDICATIONS:  Current Outpatient Medications  Medication Sig Dispense Refill  . albuterol (PROAIR HFA) 108 (90 Base) MCG/ACT inhaler Inhale 1-2  puffs every 6 hours PRN (Patient not taking: Reported on 07/12/2019) 18 g 6  . alclomethasone (ACLOVATE) 0.05 % cream APPLY TO AFFECTED AREAS TWICE DAILY AS NEEDED    . clobetasol cream (TEMOVATE) 5.46 % Apply 1 application topically 2 (two) times daily. (Patient not taking: Reported on 06/21/2019) 30 g 0  . hydrOXYzine (ATARAX/VISTARIL) 25 MG tablet TAKE 1 TO 2 TABLETS BY MOUTH ONCE DAILY AT BEDTIME AS NEEDED FOR ITCHING    . ketoconazole (NIZORAL) 2 % cream Apply 1 application topically as needed.     Marland Kitchen levonorgestrel (MIRENA, 52 MG,) 20 MCG/24HR IUD Mirena 20 mcg/24 hr (5 years) intrauterine device  Take 1 insert by intrauterine route.    . nystatin-triamcinolone (MYCOLOG II) cream     . omeprazole (PRILOSEC) 20 MG capsule Take 1 capsule (20 mg total) by mouth daily for 30 days. (Patient not taking: Reported on 07/12/2019) 30 capsule 3  . ondansetron (ZOFRAN ODT) 4 MG disintegrating tablet Take 1 tablet (4 mg total) by mouth every 8 (eight) hours as needed for nausea or vomiting. (Patient not taking: Reported on 06/21/2019) 20 tablet 0  . scopolamine (TRANSDERM-SCOP, 1.5 MG,) 1 MG/3DAYS Place 1 patch (1.5 mg total) onto the skin every 3  (three) days. (Patient not taking: Reported on 06/21/2019) 10 patch 12  . VYVANSE 10 MG capsule Take 10 mg by mouth daily.     Marland Kitchen VYVANSE 30 MG capsule Take 30 mg by mouth daily.   0   No current facility-administered medications for this visit.     REVIEW OF SYSTEMS:   Constitutional: Denies fevers, chills or abnormal night sweats Eyes: Denies blurriness of vision, double vision or watery eyes Ears, nose, mouth, throat, and  face: Denies mucositis or sore throat Respiratory: Denies cough, dyspnea or wheezes Cardiovascular: Denies palpitation, chest discomfort or lower extremity swelling Gastrointestinal:  Denies nausea, heartburn or change in bowel habits Skin: Denies abnormal skin rashes Lymphatics: Denies new lymphadenopathy or easy bruising Neurological:Denies numbness, tingling or new weaknesses Behavioral/Psych: Mood is stable, no new changes  All other systems were reviewed with the patient and are negative.  PHYSICAL EXAMINATION: ECOG PERFORMANCE STATUS: 0 - Asymptomatic  Vitals:   07/13/19 1324  BP: 133/90  Pulse: 94  Resp: 18  Temp: 98.2 F (36.8 C)  SpO2: 99%   Filed Weights   07/13/19 1324  Weight: 164 lb 1.6 oz (74.4 kg)    GENERAL:alert, no distress and comfortable SKIN: skin color, texture, turgor are normal, no rashes or significant lesions EYES: normal, conjunctiva are pink and non-injected, sclera clear OROPHARYNX:no exudate, no erythema and lips, buccal mucosa, and tongue normal  NECK: supple, thyroid normal size, non-tender, without nodularity LYMPH:  no palpable lymphadenopathy in the cervical, axillary or inguinal LUNGS: clear to auscultation and percussion with normal breathing effort HEART: regular rate & rhythm and no murmurs and no lower extremity edema ABDOMEN:abdomen soft, non-tender and normal bowel sounds Musculoskeletal:no cyanosis of digits and no clubbing  PSYCH: alert & oriented x 3 with fluent speech NEURO: no focal motor/sensory  deficits  LABORATORY DATA:  I have reviewed the data as listed Recent Results (from the past 2160 hour(s))  CMP14+EGFR     Status: Abnormal   Collection Time: 06/21/19  3:27 PM  Result Value Ref Range   Glucose 105 (H) 65 - 99 mg/dL   BUN 12 6 - 20 mg/dL   Creatinine, Ser 0.84 0.57 - 1.00 mg/dL   GFR calc non Af Amer 92 >59 mL/min/1.73   GFR calc Af Amer 106 >59 mL/min/1.73   BUN/Creatinine Ratio 14 9 - 23   Sodium 140 134 - 144 mmol/L   Potassium 4.3 3.5 - 5.2 mmol/L   Chloride 104 96 - 106 mmol/L   CO2 22 20 - 29 mmol/L   Calcium 9.1 8.7 - 10.2 mg/dL   Total Protein 7.4 6.0 - 8.5 g/dL   Albumin 4.8 3.8 - 4.8 g/dL   Globulin, Total 2.6 1.5 - 4.5 g/dL   Albumin/Globulin Ratio 1.8 1.2 - 2.2   Bilirubin Total 0.6 0.0 - 1.2 mg/dL   Alkaline Phosphatase 71 39 - 117 IU/L   AST 35 0 - 40 IU/L   ALT 68 (H) 0 - 32 IU/L  Anemia Profile B     Status: Abnormal   Collection Time: 06/21/19  3:27 PM  Result Value Ref Range   Total Iron Binding Capacity 310 250 - 450 ug/dL   UIBC 188 131 - 425 ug/dL   Iron 122 27 - 159 ug/dL   Iron Saturation 39 15 - 55 %   Ferritin 146 15 - 150 ng/mL   Vitamin B-12 566 232 - 1,245 pg/mL   Folate 10.7 >3.0 ng/mL    Comment: A serum folate concentration of less than 3.1 ng/mL is considered to represent clinical deficiency.    WBC 8.8 3.4 - 10.8 x10E3/uL   RBC 5.57 (H) 3.77 - 5.28 x10E6/uL   Hemoglobin 16.4 (H) 11.1 - 15.9 g/dL   Hematocrit 50.3 (H) 34.0 - 46.6 %   MCV 90 79 - 97 fL   MCH 29.4 26.6 - 33.0 pg   MCHC 32.6 31.5 - 35.7 g/dL   RDW 12.1 11.7 - 15.4 %  Platelets 297 150 - 450 x10E3/uL   Neutrophils 70 Not Estab. %   Lymphs 21 Not Estab. %   Monocytes 6 Not Estab. %   Eos 2 Not Estab. %   Basos 1 Not Estab. %   Neutrophils Absolute 6.2 1.4 - 7.0 x10E3/uL   Lymphocytes Absolute 1.9 0.7 - 3.1 x10E3/uL   Monocytes Absolute 0.5 0.1 - 0.9 x10E3/uL   EOS (ABSOLUTE) 0.1 0.0 - 0.4 x10E3/uL   Basophils Absolute 0.1 0.0 - 0.2 x10E3/uL    Immature Granulocytes 0 Not Estab. %   Immature Grans (Abs) 0.0 0.0 - 0.1 x10E3/uL   Retic Ct Pct 1.0 0.6 - 2.6 %  Lipid panel     Status: None   Collection Time: 06/21/19  3:27 PM  Result Value Ref Range   Cholesterol, Total 136 100 - 199 mg/dL   Triglycerides 72 0 - 149 mg/dL   HDL 51 >39 mg/dL   VLDL Cholesterol Cal 14 5 - 40 mg/dL   LDL Chol Calc (NIH) 71 0 - 99 mg/dL   Chol/HDL Ratio 2.7 0.0 - 4.4 ratio    Comment:                                   T. Chol/HDL Ratio                                             Men  Women                               1/2 Avg.Risk  3.4    3.3                                   Avg.Risk  5.0    4.4                                2X Avg.Risk  9.6    7.1                                3X Avg.Risk 23.4   11.0   TSH     Status: None   Collection Time: 06/21/19  3:27 PM  Result Value Ref Range   TSH 1.560 0.450 - 4.500 uIU/mL  Sedimentation rate     Status: None   Collection Time: 06/21/19  3:27 PM  Result Value Ref Range   Sed Rate 3 0 - 32 mm/hr  QuantiFERON-TB Gold Plus     Status: None   Collection Time: 06/21/19  3:27 PM  Result Value Ref Range   QuantiFERON Incubation Incubation performed.    QuantiFERON Criteria Comment     Comment: The QuantiFERON-TB Gold Plus result is determined by subtracting the Nil value from either TB antigen (Ag) tube. The mitogen tube serves as a control for the test.    QuantiFERON TB1 Ag Value 0.02 IU/mL   QuantiFERON TB2 Ag Value 0.02 IU/mL   QuantiFERON Nil Value 0.02 IU/mL   QuantiFERON Mitogen Value 4.40 IU/mL   QuantiFERON-TB Gold Plus Negative Negative  ANA Comprehensive Panel     Status: None   Collection Time: 06/21/19  3:27 PM  Result Value Ref Range   dsDNA Ab 1 0 - 9 IU/mL    Comment:                                    Negative      <5                                    Equivocal  5 - 9                                    Positive      >9    ENA RNP Ab <0.2 0.0 - 0.9 AI   ENA SM Ab Ser-aCnc  <0.2 0.0 - 0.9 AI   Scleroderma (Scl-70) (ENA) Antibody, IgG <0.2 0.0 - 0.9 AI   ENA SSA (RO) Ab <0.2 0.0 - 0.9 AI   ENA SSB (LA) Ab <0.2 0.0 - 0.9 AI   Chromatin Ab SerPl-aCnc <0.2 0.0 - 0.9 AI   Anti JO-1 <0.2 0.0 - 0.9 AI   Centromere Ab Screen <0.2 0.0 - 0.9 AI   See below: Comment     Comment: Autoantibody                       Disease Association ------------------------------------------------------------                         Condition                  Frequency ---------------------   ------------------------   --------- Antinuclear Antibody,    SLE, mixed connective Direct (ANA-D)           tissue diseases ---------------------   ------------------------   --------- dsDNA                    SLE                        40 - 60% ---------------------   ------------------------   --------- Chromatin                Drug induced SLE                90%                          SLE                        48 - 97% ---------------------   ------------------------   --------- SSA (Ro)                 SLE                        25 - 35%                          Sjogren's Syndrome         40 - 70%  Neonatal Lupus                 100% ---------------------   ------------------------   --------- SSB (La)                 SLE                              10%                          Sjogren's Syndrome              30% ---------------------   -----------------------    --------- Sm (anti-Smith)          SLE                        15 - 30% ---------------------   -----------------------    --------- RNP                      Mixed Connective Tissue                          Disease                         95% (U1 nRNP,                SLE                        30 - 50% anti-ribonucleoprotein)  Polymyositis and/or                          Dermatomyositis                 20% ---------------------   ------------------------   --------- Scl-70 (antiDNA           Scleroderma (diffuse)      20 - 35% topoisomerase)           Crest                           13% ---------------------   ------------------------   --------- Jo-1                     Polymyositis and/or                          Dermatomyositis            20 - 40% ---------------------   ------------------------   --------- Centromere B             Scleroderma -  Crest                          variant                         80%   Urinalysis, Routine w reflex microscopic     Status: Abnormal   Collection Time: 07/13/19  2:02 PM  Result Value Ref Range   Color, Urine YELLOW YELLOW   APPearance CLOUDY (A) CLEAR   Specific Gravity, Urine 1.013 1.005 - 1.030  pH 7.0 5.0 - 8.0   Glucose, UA NEGATIVE NEGATIVE mg/dL   Hgb urine dipstick NEGATIVE NEGATIVE   Bilirubin Urine NEGATIVE NEGATIVE   Ketones, ur NEGATIVE NEGATIVE mg/dL   Protein, ur NEGATIVE NEGATIVE mg/dL   Nitrite NEGATIVE NEGATIVE   Leukocytes,Ua NEGATIVE NEGATIVE    Comment: Performed at [facility], [address], [place], [place] [address]  CBC with Differential     Status: Abnormal   Collection Time: 07/13/19  2:17 PM  Result Value Ref Range   WBC 11.5 (H) 4.0 - 10.5 K/uL   RBC 5.35 (H) 3.87 - 5.11 MIL/uL   Hemoglobin 15.7 (H) 12.0 - 15.0 g/dL   HCT 47.7 (H) 36.0 - 46.0 %   MCV 89.2 80.0 - 100.0 fL   MCH 29.3 26.0 - 34.0 pg   MCHC 32.9 30.0 - 36.0 g/dL   RDW 12.2 11.5 - 15.5 %   Platelets 297 150 - 400 K/uL   nRBC 0.0 0.0 - 0.2 %   Neutrophils Relative % 74 %   Neutro Abs 8.5 (H) 1.7 - 7.7 K/uL   Lymphocytes Relative 19 %   Lymphs Abs 2.1 0.7 - 4.0 K/uL   Monocytes Relative 5 %   Monocytes Absolute 0.6 0.1 - 1.0 K/uL   Eosinophils Relative 1 %   Eosinophils Absolute 0.1 0.0 - 0.5 K/uL   Basophils Relative 1 %   Basophils Absolute 0.1 0.0 - 0.1 K/uL   Immature Granulocytes 0 %   Abs Immature Granulocytes 0.02 0.00 - 0.07 K/uL    Comment: Performed at [facility], [address] [place], [place] [address]     RADIOGRAPHIC STUDIES: I have personally reviewed the radiological images as listed and agreed with the findings in the report.  ASSESSMENT & PLAN:  Erythrocytosis 1.  Erythrocytosis: - CBC on 06/21/2019 showed hemoglobin of 16.4, hematocrit of 50.3.  This has increased from labs on 06/04/2017. -He denies any prior history of thrombosis.  No signs of erythromelalgia.  No aquagenic pruritus. -Occasional tingling and numbness in the fingertips, which he attributes to pinched nerve in the back. - She does report chest discomfort in the center and towards the left when she lies down from standing position. -She is a never smoker.  No secondhand smoke exposure at home. -Recent connective tissue disorder work-up was negative. -Ultrasound of the abdomen from September 2018 reviewed by me did not show any splenomegaly. -Denies any fevers, night sweats or weight loss in the last 6 months. - I have recommended repeating CBC today.  If the hemoglobin is elevated, we will send for Jak 2 V6 30F and other mutation reflex testing.  We will check her UA for hematuria.  We will also send a erythropoietin level. -She will come back in 4 weeks to discuss results.  2.  Family history: -No family history of polycythemia. -Maternal grandmother had breast cancer.  Paternal grandmother had brain tumor.  Maternal grandfather had pancreatic cancer.     All questions were answered. The patient knows to call the clinic with any problems, questions or concerns.     Derek Jack, MD 07/13/19 6:53 PM

## 2019-07-14 LAB — ERYTHROPOIETIN: Erythropoietin: 4.8 m[IU]/mL (ref 2.6–18.5)

## 2019-07-20 ENCOUNTER — Other Ambulatory Visit: Payer: Self-pay | Admitting: Family

## 2019-07-20 DIAGNOSIS — T7840XD Allergy, unspecified, subsequent encounter: Secondary | ICD-10-CM

## 2019-07-20 MED ORDER — CLOBETASOL PROPIONATE 0.05 % EX CREA: 1.0000 "application " | TOPICAL_CREAM | Freq: Two times a day (BID) | CUTANEOUS | 3 refills | Status: DC

## 2019-07-22 LAB — CALR + JAK2 E12-15 + MPL (REFLEXED)

## 2019-07-22 LAB — JAK2 V617F, W REFLEX TO CALR/E12/MPL

## 2019-08-09 MED FILL — VYVANSE 30 MG CAPSULE: 30 | 30 days supply | Qty: 30 | Fill #0

## 2019-08-10 ENCOUNTER — Ambulatory Visit (HOSPITAL_COMMUNITY): Payer: 59 | Admitting: Hematology

## 2019-08-11 ENCOUNTER — Ambulatory Visit (HOSPITAL_COMMUNITY): Payer: 59 | Admitting: Hematology

## 2019-08-19 DIAGNOSIS — F902 Attention-deficit hyperactivity disorder, combined type: Secondary | ICD-10-CM | POA: Diagnosis not present

## 2019-08-19 DIAGNOSIS — F419 Anxiety disorder, unspecified: Secondary | ICD-10-CM | POA: Diagnosis not present

## 2019-08-19 DIAGNOSIS — Z79899 Other long term (current) drug therapy: Secondary | ICD-10-CM | POA: Diagnosis not present

## 2019-08-19 MED FILL — VYVANSE 10 MG CAPSULE: 10 | 30 days supply | Qty: 30 | Fill #0

## 2019-09-06 MED FILL — VYVANSE 30 MG CAPSULE: 30 | 30 days supply | Qty: 30 | Fill #0

## 2019-09-11 ENCOUNTER — Encounter: Payer: Self-pay | Admitting: Medical Oncology

## 2019-09-11 ENCOUNTER — Other Ambulatory Visit: Payer: Self-pay

## 2019-09-11 ENCOUNTER — Emergency Department
Admission: EM | Admit: 2019-09-11 | Discharge: 2019-09-11 | Disposition: A | Discharge status: Home / Self Care | Payer: 59 | Attending: Emergency Medicine | Admitting: Emergency Medicine

## 2019-09-11 ENCOUNTER — Emergency Department: Payer: 59

## 2019-09-11 DIAGNOSIS — J45909 Unspecified asthma, uncomplicated: Secondary | ICD-10-CM | POA: Insufficient documentation

## 2019-09-11 DIAGNOSIS — Z91040 Latex allergy status: Secondary | ICD-10-CM | POA: Insufficient documentation

## 2019-09-11 DIAGNOSIS — M545 Low back pain, unspecified: Secondary | ICD-10-CM

## 2019-09-11 DIAGNOSIS — M7918 Myalgia, other site: Secondary | ICD-10-CM | POA: Diagnosis not present

## 2019-09-11 DIAGNOSIS — Z79899 Other long term (current) drug therapy: Secondary | ICD-10-CM | POA: Insufficient documentation

## 2019-09-11 DIAGNOSIS — M546 Pain in thoracic spine: Secondary | ICD-10-CM | POA: Diagnosis not present

## 2019-09-11 MED ORDER — CYCLOBENZAPRINE HCL 10 MG PO TABS
10.0000 mg | ORAL_TABLET | Freq: Once | ORAL | Status: AC
  Administered 2019-09-11: 10 mg via ORAL

## 2019-09-11 MED ORDER — CYCLOBENZAPRINE HCL 10 MG PO TABS
10.0000 mg | ORAL_TABLET | Freq: Once | ORAL | Status: AC
  Administered 2019-09-11: 03:00:00 10 mg via ORAL

## 2019-09-11 MED ORDER — CYCLOBENZAPRINE HCL 10 MG PO TABS: 10.0000 mg | ORAL_TABLET | Freq: Three times a day (TID) | ORAL | 0 refills | Status: DC | PRN
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
## 2019-09-11 NOTE — ED Provider Notes (Signed)
I assume care the patient from Dr. Cinda Quest recommendation to follow-up on thoracic and lumbar spine x-rays both of which were negative with the exception of an incidental finding of a possible 7 mm stone in the upper pole of the right kidney.  Patient was given Flexeril 10 mg tablet in the emergency department and prescribed the same for home.  I spoke with the patient at length regarding the necessity of follow-up with orthopedic surgery if pain persists   Caitlin Hams, MD 09/11/19 [phone]

## 2019-09-14 DIAGNOSIS — M545 Low back pain: Secondary | ICD-10-CM | POA: Diagnosis not present

## 2019-09-14 DIAGNOSIS — M546 Pain in thoracic spine: Secondary | ICD-10-CM | POA: Diagnosis not present

## 2019-09-14 DIAGNOSIS — M5412 Radiculopathy, cervical region: Secondary | ICD-10-CM | POA: Diagnosis not present

## 2019-10-08 MED FILL — VYVANSE 30 MG CAPSULE: 30 | 30 days supply | Qty: 30 | Fill #0

## 2019-10-25 ENCOUNTER — Other Ambulatory Visit: Payer: Self-pay | Admitting: Family

## 2019-10-25 MED ORDER — FLUCONAZOLE 150 MG PO TABS: 150.0000 mg | ORAL_TABLET | ORAL | 0 refills | Status: DC | PRN

## 2019-10-29 MED FILL — HYDROCORTISONE-PRAMOXINE CR: 2.5-1 | 14 days supply | Qty: 28 | Fill #0

## 2019-10-29 MED FILL — CIMETIDINE 400 MG TABLET: 400 | 30 days supply | Qty: 60 | Fill #0

## 2019-10-29 MED FILL — CETIRIZINE HCL 10 MG TABS: 10 | 30 days supply | Qty: 60 | Fill #0

## 2019-11-05 MED FILL — VYVANSE 30 MG CAPSULE: 30 | 30 days supply | Qty: 30 | Fill #0

## 2019-11-08 DIAGNOSIS — Z79899 Other long term (current) drug therapy: Secondary | ICD-10-CM | POA: Diagnosis not present

## 2019-11-08 DIAGNOSIS — F902 Attention-deficit hyperactivity disorder, combined type: Secondary | ICD-10-CM | POA: Diagnosis not present

## 2019-11-08 DIAGNOSIS — F419 Anxiety disorder, unspecified: Secondary | ICD-10-CM | POA: Diagnosis not present

## 2019-11-22 ENCOUNTER — Ambulatory Visit (INDEPENDENT_AMBULATORY_CARE_PROVIDER_SITE_OTHER): Payer: 59 | Admitting: Family

## 2019-11-22 ENCOUNTER — Other Ambulatory Visit: Payer: Self-pay

## 2019-11-22 ENCOUNTER — Encounter: Payer: Self-pay | Admitting: Family

## 2019-11-22 VITALS — BP 117/82 | HR 95 | Temp 98.9°F | Ht 62.0 in | Wt 176.0 lb

## 2019-11-22 DIAGNOSIS — M79652 Pain in left thigh: Secondary | ICD-10-CM | POA: Diagnosis not present

## 2019-11-22 DIAGNOSIS — N816 Rectocele: Secondary | ICD-10-CM | POA: Diagnosis not present

## 2019-11-22 DIAGNOSIS — I781 Nevus, non-neoplastic: Secondary | ICD-10-CM | POA: Diagnosis not present

## 2019-11-22 DIAGNOSIS — R748 Abnormal levels of other serum enzymes: Secondary | ICD-10-CM

## 2019-11-22 DIAGNOSIS — M79651 Pain in right thigh: Secondary | ICD-10-CM | POA: Diagnosis not present

## 2019-11-22 NOTE — Progress Notes (Signed)
Subjective:    Patient ID: Alvira Monday, female    DOB: Feb 09, 1986, 34 y.o.   MRN: [id number]  Chief Complaint  Patient presents with  . Muscle Pain    Both thighs. Spider veins. Warm to touch. Denies injury      HPI Pt presents to the office today with bilateral lateral thigh pain that started over the last month. She reports it has gradually worsen over the last two weeks. She states when she is sitting or driving and is an aching burning pain of 6 out 10.   Denies any injury. She does report noticing increasing spider veins. She does do 12 hour shifts and is standing on her feet constantly. She wears compression hose daily, but only knee high.   She also is complaining of rectocele that she noticed a few months ago. She reports she will have pressure when having a BM and have push on her vaginal wall to relieve the pressure.   She also reports she has had elevated liver enzymes in the past and would like these to be rechecked today.    Review of Systems  All other systems reviewed and are negative.      Objective:   Physical Exam Vitals reviewed.  Constitutional:      General: She is not in acute distress.    Appearance: She is well-developed.  HENT:     Head: Normocephalic and atraumatic.  Eyes:     Pupils: Pupils are equal, round, and reactive to light.  Neck:     Thyroid: No thyromegaly.  Cardiovascular:     Rate and Rhythm: Normal rate and regular rhythm.     Heart sounds: Normal heart sounds. No murmur.  Pulmonary:     Effort: Pulmonary effort is normal. No respiratory distress.     Breath sounds: Normal breath sounds. No wheezing.  Abdominal:     General: Bowel sounds are normal. There is no distension.     Palpations: Abdomen is soft.     Tenderness: There is no abdominal tenderness.  Musculoskeletal:        General: No tenderness. Normal range of motion.     Cervical back: Normal range of motion and neck supple.  Skin:    General: Skin is warm and dry.      Comments: Small telangiectasias noted on bilateral lateral thighs  Neurological:     Mental Status: She is alert and oriented to person, place, and time.     Cranial Nerves: No cranial nerve deficit.     Deep Tendon Reflexes: Reflexes are normal and symmetric.  Psychiatric:        Behavior: Behavior normal.        Thought Content: Thought content normal.        Judgment: Judgment normal.     BP 117/82   Pulse 95   Temp 98.9 F (37.2 C) (Temporal)   Ht 5' 2"  (1.575 m)   Wt 176 lb (79.8 kg)   SpO2 98%   BMI 32.19 kg/m        Assessment & Plan:  Penne Rosenstock comes in today with chief complaint of Muscle Pain (Both thighs. Spider veins. Warm to touch. Denies injury  )   Diagnosis and orders addressed:  1. Pain in both thighs - Compression stockings - Ambulatory referral to Vascular Surgery - BMP8+EGFR - Hepatic function panel  2. Telangiectasias - Ambulatory referral to Vascular Surgery - BMP8+EGFR - Hepatic function panel  3. Rectocele - Ambulatory  referral to Gastroenterology - BMP8+EGFR - Hepatic function panel  4. Elevated liver enzymes - BMP8+EGFR - Hepatic function panel   Labs pending Health Maintenance reviewed Diet and exercise encouraged  Follow up plan: As needed or if symptoms worsen  [name], FNP

## 2019-11-22 NOTE — Patient Instructions (Addendum)
Varicose Veins Varicose veins are veins that have become enlarged, bulged, and twisted. They most often appear in the legs. What are the causes? This condition is caused by damage to the valves in the vein. These valves help blood return to your heart. When they are damaged and they stop working properly, blood may flow backward and back up in the veins near the skin, causing the veins to get larger and appear twisted. The condition can result from any issue that causes blood to back up, like pregnancy, prolonged standing, or obesity. What increases the risk? This condition is more likely to develop in people who are:  On their feet a lot.  Pregnant.  Overweight. What are the signs or symptoms? Symptoms of this condition include:  Bulging, twisted, and bluish veins.  A feeling of heaviness. This may be worse at the end of the day.  Leg pain. This may be worse at the end of the day.  Swelling in the leg.  Changes in skin color over the veins. How is this diagnosed? This condition may be diagnosed based on your symptoms, a physical exam, and an ultrasound test. How is this treated? Treatment for this condition may involve:  Avoiding sitting or standing in one position for long periods of time.  Wearing compression stockings. These stockings help to prevent blood clots and reduce swelling in the legs.  Raising (elevating) the legs when resting.  Losing weight.  Exercising regularly. If you have persistent symptoms or want to improve the way your varicose veins look, you may choose to have a procedure to close the varicose veins off or to remove them. Treatments to close off the veins include:  Sclerotherapy. In this treatment, a solution is injected into a vein to close it off.  Laser treatment. In this treatment, the vein is heated with a laser to close it off.  Radiofrequency vein ablation. In this treatment, an electrical current produced by radio waves is used to close  off the vein. Treatments to remove the veins include:  Phlebectomy. In this treatment, the veins are removed through small incisions made over the veins.  Vein ligation and stripping. In this treatment, incisions are made over the veins. The veins are then removed after being tied (ligated) with stitches (sutures). Follow these instructions at home: Activity  Walk as much as possible. Walking increases blood flow. This helps blood return to the heart and takes pressure off your veins. It also increases your cardiovascular strength.  Follow your health care provider's instructions about exercising.  Do not stand or sit in one position for a long period of time.  Do not sit with your legs crossed.  Rest with your legs raised during the day. General instructions   Follow any diet instructions given to you by your health care provider.  Wear compression stockings as directed by your health care provider. Do not wear other kinds of tight clothing around your legs, pelvis, or waist.  Elevate your legs at night to above the level of your heart.  If you get a cut in the skin over the varicose vein and the vein bleeds: ? Lie down with your leg raised. ? Apply firm pressure to the cut with a clean cloth until the bleeding stops. ? Place a bandage (dressing) on the cut. Contact a health care provider if:  The skin around your varicose veins starts to break down.  You have pain, redness, tenderness, or hard swelling over a vein.  You   are uncomfortable because of pain.  You get a cut in the skin over a varicose vein and it will not stop bleeding. Summary  Varicose veins are veins that have become enlarged, bulged, and twisted. They most often appear in the legs.  This condition is caused by damage to the valves in the vein. These valves help blood return to your heart.  Treatment for this condition includes frequent movements, wearing compression stockings, losing weight, and  exercising regularly. In some cases, procedures are done to close off or remove the veins.  Treatment for this condition may include wearing compression stockings, elevating the legs, losing weight, and engaging in regular activity. In some cases, procedures are done to close off or remove the veins. This information is not intended to replace advice given to you by your health care provider. Make sure you discuss any questions you have with your health care provider. Document Revised: 11/12/2018 Document Reviewed: 10/09/2016 Elsevier Patient Education  [place]. About Rectocele  Overview  A rectocele is a type of hernia which causes different degrees of bulging of the rectal tissues into the vaginal wall.  You may even notice that it presses against the vaginal wall so much that some vaginal tissues droop outside of the opening of your vagina.  Causes of Rectocele  The most common cause is childbirth.  The muscles and ligaments in the pelvis that hold up and support the female organs and vagina become stretched and weakened during labor and delivery.  The more babies you have, the more the support tissues are stretched and weakened.  Not everyone who has a baby will develop a rectocele.  Some women have stronger supporting tissue in the pelvis and may not have as much of a problem as others.  Women who have a Cesarean section usually do not get rectocele's unless they pushed a long time prior to the cesarean delivery.  Other conditions that can cause a rectocele include chronic constipation, a chronic cough, a lot of heavy lifting, and obesity.  Older women may have this problem because the loss of female hormones causes the vaginal tissue to become weaker.  Symptoms  There may not be any symptoms.  If you do have symptoms, they may include:  Pelvic pressure in the rectal area  Protrusion of the lower part of the vagina through the opening of the vagina  Constipation and trapping  of the stool, making it difficult to have a bowel movement.  In severe cases, you may have to press on the lower part of your vagina to help push the stool out of you rectum.  This is called splinting to empty.  Diagnosing Rectocele  Your health care provider will ask about your symptoms and perform a pelvic exam.  S/he will ask you to bear down, pushing like you are having a bowel movement so as to see how far the lower part of the vagina protrudes into the vagina and possible outside of the vagina.  Your provider will also ask you to contract the muscles of your pelvis (like you are stopping the stream in the middle of urinating) to determine the strength of your pelvic muscles.  Your provider may also do a rectal exam.  Treatment Options  If you do not have any symptoms, no treatment may be necessary.  Other treatment options include:  Pelvic floor exercises: Contracting the muscles in your genital area may help strengthen your muscles and support the organs.  Be sure to  get proper exercise instruction from you physical therapist.  A pessary (removealbe pelvic support device) sometimes helps rectocele symptoms.  Surgery: Surgical repair may be necessary. In some cases the uterus may need to be taken out ( a hysterectomy) as well.  There are many types of surgery for pelvic support problems.  Look for physicians who specialize in repair procedures.  You can take care of yourself by:  Treating and preventing constipation  Avoiding heavy lifting, and lifting correctly (with your legs, not with you waist or back)  Treating a chronic cough or bronchitis  Not smoking  avoiding too much weight gain  Doing pelvic floor exercises   2007, [employer] Doc.33

## 2019-11-23 LAB — HEPATIC FUNCTION PANEL
ALT: 25 IU/L (ref 0–32)
AST: 15 IU/L (ref 0–40)
Albumin: 4.2 g/dL (ref 3.8–4.8)
Alkaline Phosphatase: 77 IU/L (ref 39–117)
Bilirubin Total: 0.3 mg/dL (ref 0.0–1.2)
Bilirubin, Direct: 0.1 mg/dL (ref 0.00–0.40)
Total Protein: 6.5 g/dL (ref 6.0–8.5)

## 2019-11-23 LAB — BMP8+EGFR
BUN/Creatinine Ratio: 9 (ref 9–23)
BUN: 7 mg/dL (ref 6–20)
CO2: 21 mmol/L (ref 20–29)
Calcium: 8.9 mg/dL (ref 8.7–10.2)
Chloride: 104 mmol/L (ref 96–106)
Creatinine, Ser: 0.78 mg/dL (ref 0.57–1.00)
GFR calc Af Amer: 116 mL/min/{1.73_m2} (ref >59)
GFR calc non Af Amer: 101 mL/min/{1.73_m2} (ref >59)
Glucose: 100 mg/dL — ABNORMAL HIGH (ref 65–99)
Potassium: 4.1 mmol/L (ref 3.5–5.2)
Sodium: 138 mmol/L (ref 134–144)

## 2019-11-30 ENCOUNTER — Ambulatory Visit (INDEPENDENT_AMBULATORY_CARE_PROVIDER_SITE_OTHER): Payer: 59 | Admitting: Gastroenterology

## 2019-11-30 ENCOUNTER — Encounter: Payer: Self-pay | Admitting: Gastroenterology

## 2019-11-30 VITALS — BP 118/64 | HR 87 | Temp 97.8°F | Ht 62.0 in | Wt 174.0 lb

## 2019-11-30 DIAGNOSIS — R1011 Right upper quadrant pain: Secondary | ICD-10-CM

## 2019-11-30 DIAGNOSIS — N816 Rectocele: Secondary | ICD-10-CM

## 2019-11-30 DIAGNOSIS — G8929 Other chronic pain: Secondary | ICD-10-CM | POA: Diagnosis not present

## 2019-11-30 DIAGNOSIS — K625 Hemorrhage of anus and rectum: Secondary | ICD-10-CM

## 2019-11-30 MED ORDER — NA SULFATE-K SULFATE-MG SULF 17.5-3.13-1.6 GM/177ML PO SOLN: 1.0000 | Freq: Once | ORAL | 0 refills | Status: AC

## 2019-11-30 MED FILL — SUPREP BOWEL PREP KIT: 17.5-3.13-1 | 1 days supply | Qty: 354 | Fill #0

## 2019-11-30 NOTE — Patient Instructions (Addendum)
If you are age 33 or older, your body mass index should be between 23-30. Your Body mass index is 31.83 kg/m. If this is out of the aforementioned range listed, please consider follow up with your Primary Care Provider.  If you are age 52 or younger, your body mass index should be between 19-25. Your Body mass index is 31.83 kg/m. If this is out of the aformentioned range listed, please consider follow up with your Primary Care Provider.   You have been scheduled for a colonoscopy. Please follow written instructions given to you at your visit today.  Please pick up your prep supplies at the pharmacy within the next 1-3 days. If you use inhalers (even only as needed), please bring them with you on the day of your procedure.  It was a pleasure to see you today!  Dr. Loletha Carrow

## 2019-11-30 NOTE — Progress Notes (Signed)
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  For about the last 6 months, she has had a feeling of pelvic and rectal pressure during and after bowel movements.  It feels like there is a protrusion and "something in there".  She may have to put pressure on the posterior vaginal wall because it feels like there is "a pocket" where the stool is stuck and she needs to relieve the pressure.  She wonders if the mucosal wall there has become thinned out, because there might be some tinge of blood on her finger when she does that. For the last few year she has had intermittent rectal bleeding that she has attributed to hemorrhoids.  There is also itching that feels both external and internal at times, sometimes severe and does not seem to get much better with use of Preparation H cream. She had a bladder sling years ago for stress incontinence, and recalls being told that a cystocele was repaired at that time as well.  As a separate symptom, she wonders if she may have a hernia, where she would get intermittent sharp pain in the right upper quadrant if she lays back and stretches and then sits forward.  At times that may cause a protrusion in the area and she has to massage it back down to relieve the discomfort. Denies nausea vomiting or odynophagia.  Intermittent heartburn sometimes cause feeling of lump in the throat.  ROS:  Review of Systems  Constitutional: Negative for appetite change and unexpected weight  change.  HENT: Negative for mouth sores and voice change.   Eyes: Negative for pain and redness.  Respiratory: Negative for cough and shortness of breath.   Cardiovascular: Negative for chest pain and palpitations.  Genitourinary: Negative for dysuria and hematuria.  Musculoskeletal: Negative for arthralgias and myalgias.  Skin: Negative for pallor and rash.  Neurological: Negative for weakness and headaches.  Hematological: Negative for adenopathy.     Past Medical History: Past Medical History:  Diagnosis Date  . Anxiety   . Asthma   . Concussion   . Depression   . GERD (gastroesophageal reflux disease)   . Headache(784.0)    Migraines  . Herpes   . Keratosis   . Kidney stone   . Nephrolithiasis   . PONV (postoperative nausea and vomiting)    prolonged sedation  . Postural orthostatic tachycardia syndrome   . Tingling in extremities    occ     Past Surgical History: Past Surgical History:  Procedure Laterality Date  . BLADDER SUSPENSION N/A 05/01/2016   Procedure: TRANSVAGINAL TAPE (TVT) PROCEDURE;  Surgeon: Cheri Fowler, MD;  Location: [facility];  Service: Gynecology;  Laterality: N/A;  . BREAST ENHANCEMENT SURGERY    . CYSTOCELE REPAIR N/A 05/01/2016   Procedure: ANTERIOR REPAIR (CYSTOCELE);  Surgeon: Cheri Fowler, MD;  Location: [facility];  Service: Gynecology;  Laterality: N/A;     Family History: Family History  Problem Relation Age of Onset  . Alcohol abuse Father   . Diabetes Father   .  Arthritis Maternal Aunt   . Arthritis Maternal Grandmother   . COPD Maternal Grandmother   . Thyroid disease Maternal Grandmother   . Alcohol abuse Maternal Grandfather   . Cancer Maternal Grandfather        liver, pancreatic  . Cancer Paternal Grandmother        brain  . Alcohol abuse Paternal Grandfather   . Stroke Paternal Grandfather   . Heart disease Neg Hx        early heart disease  . Sudden death Neg Hx        cardiac  . Cardiomyopathy Neg Hx     Social  History: Social History   Socioeconomic History  . Marital status: Married    Spouse name: Jeneen Rinks  . Number of children: Not on file  . Years of education: 3  . Highest education level: Not on file  Occupational History  . Not on file  Tobacco Use  . Smoking status: Never Smoker  . Smokeless tobacco: Never Used  Substance and Sexual Activity  . Alcohol use: Yes    Comment: occas  . Drug use: No  . Sexual activity: Yes    Birth control/protection: I.U.D.  Other Topics Concern  . Not on file  Social History Narrative   Married, 2 children.    Social Determinants of Health   Financial Resource Strain:   . Difficulty of Paying Living Expenses: Not on file  Food Insecurity:   . Worried About Charity fundraiser in the Last Year: Not on file  . Ran Out of Food in the Last Year: Not on file  Transportation Needs:   . Lack of Transportation (Medical): Not on file  . Lack of Transportation (Non-Medical): Not on file  Physical Activity:   . Days of Exercise per Week: Not on file  . Minutes of Exercise per Session: Not on file  Stress:   . Feeling of Stress : Not on file  Social Connections:   . Frequency of Communication with Friends and Family: Not on file  . Frequency of Social Gatherings with Friends and Family: Not on file  . Attends Religious Services: Not on file  . Active Member of Clubs or Organizations: Not on file  . Attends Archivist Meetings: Not on file  . Marital Status: Not on file    Allergies: Allergies  Allergen Reactions  . Prozac [Fluoxetine Hcl] Nausea Only  . Latex Rash    Outpatient Meds: Current Outpatient Medications  Medication Sig Dispense Refill  . albuterol (PROAIR HFA) 108 (90 Base) MCG/ACT inhaler Inhale 1-2  puffs every 6 hours PRN 18 g 6  . cetirizine (ZYRTEC) 10 MG tablet     . clobetasol cream (TEMOVATE) [id number] % Apply 1 application topically 2 (two) times daily. 60 g 3  . levonorgestrel (MIRENA, 52 MG,) 20 MCG/24HR IUD  Mirena 20 mcg/24 hr (5 years) intrauterine device  Take 1 insert by intrauterine route.    . ondansetron (ZOFRAN ODT) 4 MG disintegrating tablet Take 1 tablet (4 mg total) by mouth every 8 (eight) hours as needed for nausea or vomiting. 20 tablet 0  . scopolamine (TRANSDERM-SCOP, 1.5 MG,) 1 MG/3DAYS Place 1 patch (1.5 mg total) onto the skin every 3 (three) days. 10 patch 12  . VYVANSE 10 MG capsule Take 10 mg by mouth daily.     Marland Kitchen VYVANSE 30 MG capsule Take 30 mg by mouth daily.   0   No current facility-administered medications for  this visit.      ___________________________________________________________________ Objective   Exam:  BP 118/64   Pulse 87   Temp 97.8 F (36.6 C)   Ht 5\' 2"  (1.575 m)   Wt 174 lb (78.9 kg)   SpO2 98%   BMI 31.83 kg/m  Exam chaperoned by our MA Jan  General: Well-appearing  Eyes: sclera anicteric, no redness  ENT: oral mucosa moist without lesions, no cervical or supraclavicular lymphadenopathy.  Tongue stud  CV: RRR without murmur, S1/S2, no JVD, no peripheral edema  Resp: clear to auscultation bilaterally, normal RR and effort noted  GI: soft, no tenderness, with active bowel sounds. No guarding or palpable organomegaly noted.  Skin; warm and dry, no rash or jaundice noted  Neuro: awake, alert and oriented x 3. Normal gross motor function and fluent speech Rectal: 2 small skin tags, otherwise healthy appearing perianal skin.  On DRE, no fissure mass or tenderness.  Resting and voluntary sphincter tone including puborectalis normal.  Significant cervical descent with Valsalva.  Data:  CLINICAL DATA:  Right upper quadrant pain   EXAM: ABDOMEN ULTRASOUND COMPLETE   COMPARISON:  CT 01/19/2015   FINDINGS: Gallbladder: No gallstones or wall thickening visualized. No sonographic Murphy sign noted by sonographer.   Common bile duct: Diameter: Normal caliber, 4 mm   Liver: No focal lesion identified. Within normal limits in  parenchymal echogenicity. Portal vein is patent on color Doppler imaging with normal direction of blood flow towards the liver.   IVC: No abnormality visualized.   Pancreas: Visualized portion unremarkable.   Spleen: Size and appearance within normal limits.   Right Kidney: Length: 12.0 cm. Echogenicity within normal limits. No mass or hydronephrosis visualized.   Left Kidney: Length: 12.3 cm. Echogenicity within normal limits. No mass or hydronephrosis visualized.   Abdominal aorta: No aneurysm visualized.   Other findings: None.   IMPRESSION: Unremarkable abdominal ultrasound.     Electronically Signed   By: Rolm Baptise M.D.   On: 06/06/2017 12:49  Assessment: Encounter Diagnoses  Name Primary?  . Rectal bleeding Yes  . Rectocele     Rectal bleeding most likely hemorrhoidal in nature.  She reports her father had polyps of some kind in his 20s.Teiara believes she saw a GI physician years ago for all that, and was advised to have a colonoscopy, but did not pursue it. Her symptoms sound like a rectocele, but complete evaluation of that requires a gynecologic bimanual exam.  She saw her gynecologist within the last year as best she recalls, but was not having this trouble the time and did not have that degree of examination.  Right upper quadrant pain was evaluated previously, no gallstones, and it does not sound like biliary colic.  It does sound related to the abdominal wall musculature, but I is unable to elicit a hernia on exam today.  Plan:  Colonoscopy to evaluate rectal bleeding, rule out chronic colitis/proctitis or neoplasia, as I expect she will need surgical repair of her rectocele.  The benefits and risks of the planned procedure were described in detail with the patient or (when appropriate) their health care proxy.  Risks were outlined as including, but not limited to, bleeding, infection, perforation, adverse medication reaction leading to cardiac or pulmonary  decompensation, pancreatitis (if ERCP).  The limitation of incomplete mucosal visualization was also discussed.  No guarantees or warranties were given.   She will call her gynecologist office today and set up an appointment for evaluation.  I will  fax my note to them as well.  Thank you for the courtesy of this consult.  Please call me with any questions or concerns.  Nelida Meuse III  CC: Referring provider noted above Also Tereso Newcomer, [facility]

## 2019-12-03 DIAGNOSIS — L282 Other prurigo: Secondary | ICD-10-CM | POA: Diagnosis not present

## 2019-12-03 DIAGNOSIS — N816 Rectocele: Secondary | ICD-10-CM | POA: Diagnosis not present

## 2019-12-03 MED FILL — VYVANSE 30 MG CAPSULE: 30 | 30 days supply | Qty: 30 | Fill #0

## 2019-12-07 ENCOUNTER — Encounter: Payer: Self-pay | Admitting: Gastroenterology

## 2019-12-08 ENCOUNTER — Ambulatory Visit (AMBULATORY_SURGERY_CENTER): Payer: 59 | Admitting: Gastroenterology

## 2019-12-08 ENCOUNTER — Encounter: Payer: Self-pay | Admitting: Gastroenterology

## 2019-12-08 ENCOUNTER — Other Ambulatory Visit: Payer: Self-pay

## 2019-12-08 VITALS — BP 109/64 | HR 75 | Temp 97.3°F | Resp 15 | Ht 62.0 in | Wt 174.0 lb

## 2019-12-08 DIAGNOSIS — K625 Hemorrhage of anus and rectum: Secondary | ICD-10-CM

## 2019-12-08 MED ORDER — SODIUM CHLORIDE 0.9 % IV SOLN: 500.0000 mL | Freq: Once | INTRAVENOUS | Status: DC

## 2019-12-08 NOTE — Progress Notes (Signed)
PT taken to PACU. Monitors in place. VSS. Report given to RN. 

## 2019-12-08 NOTE — Patient Instructions (Signed)
Discharge instructions given. Normal exam. Resume previous medications. YOU HAD AN ENDOSCOPIC PROCEDURE TODAY AT THE [facility]:   Refer to the procedure report that was given to you for any specific questions about what was found during the examination.  If the procedure report does not answer your questions, please call your gastroenterologist to clarify.  If you requested that your care partner not be given the details of your procedure findings, then the procedure report has been included in a sealed envelope for you to review at your convenience later.  YOU SHOULD EXPECT: Some feelings of bloating in the abdomen. Passage of more gas than usual.  Walking can help get rid of the air that was put into your GI tract during the procedure and reduce the bloating. If you had a lower endoscopy (such as a colonoscopy or flexible sigmoidoscopy) you may notice spotting of blood in your stool or on the toilet paper. If you underwent a bowel prep for your procedure, you may not have a normal bowel movement for a few days.  Please Note:  You might notice some irritation and congestion in your nose or some drainage.  This is from the oxygen used during your procedure.  There is no need for concern and it should clear up in a day or so.  SYMPTOMS TO REPORT IMMEDIATELY:  Following lower endoscopy (colonoscopy or flexible sigmoidoscopy):  Excessive amounts of blood in the stool  Significant tenderness or worsening of abdominal pains  Swelling of the abdomen that is new, acute  Fever of 100F or higher   For urgent or emergent issues, a gastroenterologist can be reached at any hour by calling [phone]. Do not use MyChart messaging for urgent concerns.    DIET:  We do recommend a small meal at first, but then you may proceed to your regular diet.  Drink plenty of fluids but you should avoid alcoholic beverages for 24 hours.  ACTIVITY:  You should plan to take it easy for the rest of  today and you should NOT DRIVE or use heavy machinery until tomorrow (because of the sedation medicines used during the test).    FOLLOW UP: Our staff will call the number listed on your records 48-72 hours following your procedure to check on you and address any questions or concerns that you may have regarding the information given to you following your procedure. If we do not reach you, we will leave a message.  We will attempt to reach you two times.  During this call, we will ask if you have developed any symptoms of COVID 19. If you develop any symptoms (ie: fever, flu-like symptoms, shortness of breath, cough etc.) before then, please call [phone].  If you test positive for Covid 19 in the 2 weeks post procedure, please call and report this information to us.    If any biopsies were taken you will be contacted by phone or by letter within the next 1-3 weeks.  Please call us at [phone] if you have not heard about the biopsies in 3 weeks.    SIGNATURES/CONFIDENTIALITY: You and/or your care partner have signed paperwork which will be entered into your electronic medical record.  These signatures attest to the fact that that the information above on your After Visit Summary has been reviewed and is understood.  Full responsibility of the confidentiality of this discharge information lies with you and/or your care-partner.  

## 2019-12-08 NOTE — Progress Notes (Signed)
Pt's states no medical or surgical changes since previsit or office visit.  JB - temp CW - vitals. 

## 2019-12-08 NOTE — Op Note (Signed)
[facility] Patient Name: [name] Procedure Date: 12/08/2019 8:53 AM MRN: [id number] Endoscopist: Mallie Mussel L. Loletha Carrow , MD Age: 34 Referring MD:  Date of Birth: [birth year] Gender: Female Account #: [id number] Procedure:                Colonoscopy Indications:              Rectal bleeding Medicines:                Monitored Anesthesia Care Procedure:                Pre-Anesthesia Assessment:                           - Prior to the procedure, a History and Physical                            was performed, and patient medications and                            allergies were reviewed. The patient's tolerance of                            previous anesthesia was also reviewed. The risks                            and benefits of the procedure and the sedation                            options and risks were discussed with the patient.                            All questions were answered, and informed consent                            was obtained. Prior Anticoagulants: The patient has                            taken no previous anticoagulant or antiplatelet                            agents. ASA Grade Assessment: II - A patient with                            mild systemic disease. After reviewing the risks                            and benefits, the patient was deemed in                            satisfactory condition to undergo the procedure.                           After obtaining informed consent, the colonoscope  was passed under direct vision. Throughout the                            procedure, the patient's blood pressure, pulse, and                            oxygen saturations were monitored continuously. The                            Colonoscope was introduced through the anus and                            advanced to the the terminal ileum, with                            identification of the appendiceal orifice and IC                        valve. The colonoscopy was performed without                            difficulty. The patient tolerated the procedure                            well. The quality of the bowel preparation was                            excellent. The terminal ileum, ileocecal valve,                            appendiceal orifice, and rectum were photographed. Scope In: 9:08:01 AM Scope Out: 9:22:30 AM Scope Withdrawal Time: 0 hours 11 minutes 13 seconds  Total Procedure Duration: 0 hours 14 minutes 29 seconds  Findings:                 The perianal and digital rectal examinations were                            normal.                           The terminal ileum appeared normal.                           The entire examined colon appeared normal on direct                            and retroflexion views. Complications:            No immediate complications. Estimated Blood Loss:     Estimated blood loss: none. Impression:               - The examined portion of the ileum was normal.                           - The entire examined colon is normal on direct and  retroflexion views.                           - No specimens collected.                           No internal hemorrhoids seen on this exam. Benign                            anal bleeding related to chronic constipation,                            expected to improve after surgical repair of                            rectocele. Recommendation:           - Patient has a contact number available for                            emergencies. The signs and symptoms of potential                            delayed complications were discussed with the                            patient. Return to normal activities tomorrow.                            Written discharge instructions were provided to the                            patient.                           - Resume previous diet.                            - Continue present medications.                           - No recommendation at this time regarding repeat                            colonoscopy. Henry L. Loletha Carrow, MD 12/08/2019 9:26:14 AM This report has been signed electronically.

## 2019-12-10 ENCOUNTER — Telehealth: Payer: Self-pay

## 2019-12-10 NOTE — Telephone Encounter (Signed)
  Follow up Call-  Call back number 12/08/2019  Post procedure Call Back phone  # [phone]  Permission to leave phone message Yes  Some recent data might be hidden     Patient questions:  Do you have a fever, pain , or abdominal swelling? No. Pain Score  0 *  Have you tolerated food without any problems? Yes.    Have you been able to return to your normal activities? Yes.    Do you have any questions about your discharge instructions: Diet   No. Medications  No. Follow up visit  No.  Do you have questions or concerns about your Care? No.  Actions: * If pain score is 4 or above: No action needed, pain <4. 1. Have you developed a fever since your procedure? no  2.   Have you had an respiratory symptoms (SOB or cough) since your procedure? no  3.   Have you tested positive for COVID 19 since your procedure no  4.   Have you had any family members/close contacts diagnosed with the COVID 19 since your procedure?  no   If yes to any of these questions please route to Joylene John, RN and Alphonsa Gin, Therapist, sports.

## 2019-12-22 ENCOUNTER — Other Ambulatory Visit: Payer: Self-pay | Admitting: *Deleted

## 2019-12-22 DIAGNOSIS — M25562 Pain in left knee: Secondary | ICD-10-CM

## 2019-12-22 DIAGNOSIS — M25561 Pain in right knee: Secondary | ICD-10-CM

## 2019-12-22 MED FILL — HYDROCORTISONE-PRAMOXINE CR: 2.5-1 | 14 days supply | Qty: 28 | Fill #1

## 2019-12-23 ENCOUNTER — Other Ambulatory Visit: Payer: Self-pay

## 2019-12-23 ENCOUNTER — Ambulatory Visit: Payer: 59 | Admitting: Physician Assistant

## 2019-12-23 ENCOUNTER — Ambulatory Visit (HOSPITAL_COMMUNITY)
Admission: RE | Admit: 2019-12-23 | Discharge: 2019-12-23 | Disposition: A | Discharge status: Home / Self Care | Payer: 59 | Source: Ambulatory Visit | Attending: Physician Assistant | Admitting: Physician Assistant

## 2019-12-23 ENCOUNTER — Encounter: Payer: Self-pay | Admitting: Vascular Surgery

## 2019-12-23 VITALS — BP 118/84 | Temp 97.6°F | Resp 16 | Ht 62.0 in | Wt 177.1 lb

## 2019-12-23 DIAGNOSIS — I872 Venous insufficiency (chronic) (peripheral): Secondary | ICD-10-CM | POA: Diagnosis not present

## 2019-12-23 DIAGNOSIS — M25561 Pain in right knee: Secondary | ICD-10-CM | POA: Diagnosis not present

## 2019-12-23 DIAGNOSIS — R609 Edema, unspecified: Secondary | ICD-10-CM

## 2019-12-23 DIAGNOSIS — M25562 Pain in left knee: Secondary | ICD-10-CM | POA: Insufficient documentation

## 2019-12-23 NOTE — Progress Notes (Signed)
Requested by:  Sharion Balloon, [place] [place],  [place] [address]  Reason for consultation: spider veins, edema BLE    History of Present Illness   [name] is a 34 y.o. 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 Anxiety   . Asthma   . Concussion   . Depression   . GERD (gastroesophageal reflux disease)   . Headache(784.0)    Migraines  . Herpes   . Keratosis   . Kidney stone   . Nephrolithiasis   . PONV (postoperative nausea and vomiting)    prolonged sedation  . Postural orthostatic tachycardia syndrome   . Tingling in extremities    occ    Past Surgical History:  Procedure Laterality Date  . BLADDER SUSPENSION N/A 05/01/2016   Procedure: TRANSVAGINAL TAPE (TVT) PROCEDURE;  Surgeon: Cheri Fowler, MD;  Location: [facility];  Service: Gynecology;  Laterality: N/A;  . BREAST ENHANCEMENT SURGERY    . CYSTOCELE REPAIR N/A 05/01/2016   Procedure: ANTERIOR REPAIR (CYSTOCELE);  Surgeon: Cheri Fowler, MD;  Location: [facility];  Service: Gynecology;  Laterality: N/A;    Social History   Socioeconomic History  . Marital status: Married    Spouse name: Jeneen Rinks  . Number of children: Not on file  . Years of education: 3  . Highest education level: Not on file  Occupational History    . Not on file  Tobacco Use  . Smoking status: Never Smoker  . Smokeless tobacco: Never Used  Substance and Sexual Activity  . Alcohol use: Yes    Comment: occas  . Drug use: No  . Sexual activity: Yes    Birth control/protection: I.U.D.  Other Topics Concern  . Not on file  Social History Narrative   Married, 2 children.    Social Determinants of Health   Financial Resource Strain:   . Difficulty of Paying Living Expenses:   Food Insecurity:   . Worried About Charity fundraiser in the Last Year:   . Arboriculturist in the Last Year:   Transportation Needs:   . Film/video editor (Medical):   Marland Kitchen Lack of Transportation (Non-Medical):   Physical Activity:   . Days of Exercise per Week:   . Minutes of Exercise per Session:   Stress:   . Feeling of Stress :   Social Connections:   . Frequency of Communication with Friends and Family:   . Frequency of Social Gatherings with Friends and Family:   . Attends Religious Services:   . Active Member of Clubs or Organizations:   . Attends Archivist Meetings:   Marland Kitchen Marital Status:   Intimate Partner Violence:   . Fear of Current or Ex-Partner:   . Emotionally Abused:   Marland Kitchen Physically Abused:   . Sexually Abused:     Family History  Problem  Relation Age of Onset  . Alcohol abuse Father   . Diabetes Father   . Arthritis Maternal Aunt   . Arthritis Maternal Grandmother   . COPD Maternal Grandmother   . Thyroid disease Maternal Grandmother   . Alcohol abuse Maternal Grandfather   . Cancer Maternal Grandfather        liver, pancreatic  . Cancer Paternal Grandmother        brain  . Alcohol abuse Paternal Grandfather   . Stroke Paternal Grandfather   . Heart disease Neg Hx        early heart disease  . Sudden death Neg Hx        cardiac  . Cardiomyopathy Neg Hx   . Rectal cancer Neg Hx   . Stomach cancer Neg Hx   . Esophageal cancer Neg Hx   . Colon cancer Neg Hx     Current Outpatient Medications   Medication Sig Dispense Refill  . albuterol (PROAIR HFA) 108 (90 Base) MCG/ACT inhaler Inhale 1-2  puffs every 6 hours PRN 18 g 6  . cetirizine (ZYRTEC) 10 MG tablet     . clobetasol cream (TEMOVATE) [id number] % Apply 1 application topically 2 (two) times daily. 60 g 3  . levonorgestrel (MIRENA, 52 MG,) 20 MCG/24HR IUD Mirena 20 mcg/24 hr (5 years) intrauterine device  Take 1 insert by intrauterine route.    . ondansetron (ZOFRAN ODT) 4 MG disintegrating tablet Take 1 tablet (4 mg total) by mouth every 8 (eight) hours as needed for nausea or vomiting. 20 tablet 0  . scopolamine (TRANSDERM-SCOP, 1.5 MG,) 1 MG/3DAYS Place 1 patch (1.5 mg total) onto the skin every 3 (three) days. 10 patch 12  . VYVANSE 10 MG capsule Take 10 mg by mouth daily.     Marland Kitchen VYVANSE 30 MG capsule Take 30 mg by mouth daily.   0   No current facility-administered medications for this visit.    Allergies  Allergen Reactions  . Prozac [Fluoxetine Hcl] Nausea Only  . Latex Rash    REVIEW OF SYSTEMS (negative unless checked):   Cardiac:  []  Chest pain or chest pressure? []  Shortness of breath upon activity? []  Shortness of breath when lying flat? []  Irregular heart rhythm?  Vascular:  []  Pain in calf, thigh, or hip brought on by walking? []  Pain in feet at night that wakes you up from your sleep? []  Blood clot in your veins? [x]  Leg swelling?  Pulmonary:  []  Oxygen at home? []  Productive cough? []  Wheezing?  Neurologic:  []  Sudden weakness in arms or legs? []  Sudden numbness in arms or legs? []  Sudden onset of difficult speaking or slurred speech? []  Temporary loss of vision in one eye? []  Problems with dizziness?  Gastrointestinal:  []  Blood in stool? []  Vomited blood?  Genitourinary:  []  Burning when urinating? []  Blood in urine?  Psychiatric:  []  Major depression  Hematologic:  []  Bleeding problems? []  Problems with blood clotting?  Dermatologic:  [x]  Rashes or ulcers? Inner thigh rash  with use of thigh high compression   Constitutional:  []  Fever or chills?  Ear/Nose/Throat:  []  Change in hearing? []  Nose bleeds? []  Sore throat?  Musculoskeletal:  []  Back pain? []  Joint pain? []  Muscle pain?   Physical Examination     Vitals:   12/23/19 1344  BP: 118/84  Resp: 16  Temp: 97.6 F (36.4 C)  TempSrc: Oral  SpO2: 99%  Weight: 177 lb 1.6 oz (80.3 kg)  Height:  5\' 2"  (1.575 m)   Body mass index is 32.39 kg/m.  General:  WDWN in NAD; vital signs documented above Gait: Not observed HENT: WNL, normocephalic Pulmonary: normal non-labored breathing , without Rales, rhonchi,  wheezing Cardiac: regular HR Abdomen: soft Skin: without rashes Vascular Exam/Pulses:  Right Left  Radial 2+ (normal) 2+ (normal)  DP 2+ (normal) 2+ (normal)   Extremities: no prominent spider veins or varicose veins noted BLE Musculoskeletal: no muscle wasting or atrophy  Neurologic: A&O X 3;  No focal weakness or paresthesias are detected Psychiatric:  The pt has Normal affect.  Non-invasive Vascular Imaging   BLE Venous Insufficiency Duplex:   RLE:   Negative for DVT and SVT,   GSV reflux at [facility] and proximal thigh,  GSV diameter 20mm throughout thigh  Negative for SSV reflux,  CFV deep venous reflux   LLE:  Negative for DVT and SVT,   GSV reflux at [facility] and at knee,   GSV diameter 30mm throughout thigh  Negative for SSV reflux,  CFV deep venous reflux   Medical Decision Making   Olukemi Clotfelter is a 34 y.o. female who presents with chronic venous insufficiency   Venous duplex negative for DVT bilateral lower extremities  Based on venous reflux study, she does have evidence of reflux bilateral saphenofemoral junction however greater saphenous vein is competent through most thigh segment bilaterally  Encourage patient to continue to wear compression especially during long work shifts  Also encouraged NSAIDs for any discomfort associated with venous  symptoms and elevation when possible during the day  She would like to repeat venous reflux study in 1 year  Return sooner if venous symptoms worsen   Dagoberto Ligas, PA-C [facility] Office: [phone]  12/23/2019, 2:29 PM  Clinic MD: Oneida Alar

## 2019-12-27 ENCOUNTER — Other Ambulatory Visit: Payer: Self-pay | Admitting: *Deleted

## 2019-12-27 DIAGNOSIS — I872 Venous insufficiency (chronic) (peripheral): Secondary | ICD-10-CM

## 2020-01-12 MED FILL — VYVANSE 30 MG CAPSULE: 30 | 30 days supply | Qty: 30 | Fill #0

## 2020-01-12 MED FILL — VYVANSE 10 MG CAPSULE: 10 | 30 days supply | Qty: 30 | Fill #0

## 2020-02-08 ENCOUNTER — Other Ambulatory Visit: Payer: Self-pay | Admitting: Family

## 2020-02-08 ENCOUNTER — Other Ambulatory Visit: Payer: Self-pay | Admitting: Family Medicine

## 2020-02-08 DIAGNOSIS — F902 Attention-deficit hyperactivity disorder, combined type: Secondary | ICD-10-CM | POA: Diagnosis not present

## 2020-02-08 DIAGNOSIS — Z79899 Other long term (current) drug therapy: Secondary | ICD-10-CM | POA: Diagnosis not present

## 2020-02-08 DIAGNOSIS — Z87898 Personal history of other specified conditions: Secondary | ICD-10-CM

## 2020-02-15 ENCOUNTER — Other Ambulatory Visit: Payer: 59

## 2020-02-15 NOTE — Patient Instructions (Addendum)
[name]       Your procedure is scheduled on 02/24/20   Report to [facility]  At 5:30   A.M.   Call this number if you have problems the morning of [phone]    OUR ADDRESS IS [address], WE ARE LOCATED [facility] THE [facility] WITH [facility].   Remember:  Do not eat food after midnight.  You may have clear liquids until 4:30 AM   Take these medicines the morning of surgery with A SIP OF WATER: Zyrtec, Contraceptive . Use your inhaler and bring it to the hospital with you   Do not wear jewelry, make-up or nail polish.  Do not wear lotions, powders, or perfumes, or deoderant.  Do not shave 48 hours prior to surgery.  .  Do not bring valuables to the hospital.  [facility] is not responsible for any belongings or valuables.  Contacts, dentures or bridgework may not be worn into surgery.    For patients admitted to the hospital, discharge time will be determined by your treatment team.  Patients discharged the day of surgery will not be allowed to drive home.   Special instructions:  Bring your prescription meds in their original bottles  Please read over the following fact sheets that you were given:       [facility] - Preparing for Surgery Before surgery, you can play an important role.   Because skin is not sterile, your skin needs to be as free of germs as possible .  You can reduce the number of germs on your skin by washing with CHG (chlorahexidine gluconate) soap before surgery.   CHG is an antiseptic cleaner which kills germs and bonds with the skin to continue killing germs even after washing. Please DO NOT use if you have an allergy to CHG or antibacterial soaps.   If your skin becomes reddened/irritated stop using the CHG and inform your nurse when you arrive at Short Stay. Do not shave (including legs and underarms) for at least 48 hours prior to the first CHG shower.    Please follow these instructions  carefully:  1.  Shower with CHG Soap the night before surgery and the  morning of Surgery.  2.  If you choose to wash your hair, wash your hair first as usual with your  normal  shampoo.  3.  After you shampoo, rinse your hair and body thoroughly to remove the  shampoo.                                        4.  Use CHG as you would any other liquid soap.  You can apply chg directly  to the skin and wash                       Gently with a scrungie or clean washcloth.  5.  Apply the CHG Soap to your body ONLY FROM THE NECK DOWN.   Do not use on face/ open                           Wound or open sores. Avoid contact with eyes, ears mouth and genitals (private parts).  Wash face,  Genitals (private parts) with your normal soap.             6.  Wash thoroughly, paying special attention to the area where your surgery  will be performed.  7.  Thoroughly rinse your body with warm water from the neck down.  8.  DO NOT shower/wash with your normal soap after using and rinsing off  the CHG Soap.             9.  Pat yourself dry with a clean towel.            10.  Wear clean pajamas.            11.  Place clean sheets on your bed the night of your first shower and do not  sleep with pets. Day of Surgery : Do not apply any lotions/deodorants the morning of surgery.  Please wear clean clothes to the hospital/surgery center.  FAILURE TO FOLLOW THESE INSTRUCTIONS MAY RESULT IN THE CANCELLATION OF YOUR SURGERY PATIENT SIGNATURE_________________________________  NURSE SIGNATURE__________________________________  ________________________________________________________________________

## 2020-02-16 ENCOUNTER — Encounter (HOSPITAL_COMMUNITY): Payer: Self-pay

## 2020-02-16 ENCOUNTER — Encounter (HOSPITAL_COMMUNITY)
Admission: RE | Admit: 2020-02-16 | Discharge: 2020-02-16 | Disposition: A | Discharge status: Home / Self Care | Payer: 59 | Source: Ambulatory Visit | Attending: Obstetrics and Gynecology | Admitting: Obstetrics and Gynecology

## 2020-02-16 ENCOUNTER — Other Ambulatory Visit: Payer: Self-pay

## 2020-02-16 DIAGNOSIS — Z01812 Encounter for preprocedural laboratory examination: Secondary | ICD-10-CM | POA: Diagnosis not present

## 2020-02-16 DIAGNOSIS — Z01818 Encounter for other preprocedural examination: Secondary | ICD-10-CM | POA: Diagnosis not present

## 2020-02-16 HISTORY — DX: Family history of other specified conditions: Z84.89

## 2020-02-16 HISTORY — DX: Cardiac arrhythmia, unspecified: I49.9

## 2020-02-16 HISTORY — DX: Personal history of urinary calculi: Z87.442

## 2020-02-16 LAB — COMPREHENSIVE METABOLIC PANEL
ALT: 24 U/L (ref 0–44)
AST: 19 U/L (ref 15–41)
Albumin: 4.3 g/dL (ref 3.5–5.0)
Alkaline Phosphatase: 63 U/L (ref 38–126)
Anion gap: 7 (ref 5–15)
BUN: 11 mg/dL (ref 6–20)
CO2: 27 mmol/L (ref 22–32)
Calcium: 8.7 mg/dL — ABNORMAL LOW (ref 8.9–10.3)
Chloride: 104 mmol/L (ref 98–111)
Creatinine, Ser: 0.86 mg/dL (ref 0.44–1.00)
GFR calc Af Amer: >60 mL/min (ref >60)
GFR calc non Af Amer: >60 mL/min (ref >60)
Glucose, Bld: 113 mg/dL — ABNORMAL HIGH (ref 70–99)
Potassium: 4 mmol/L (ref 3.5–5.1)
Sodium: 138 mmol/L (ref 135–145)
Total Bilirubin: 0.9 mg/dL (ref 0.3–1.2)
Total Protein: 7.1 g/dL (ref 6.5–8.1)

## 2020-02-16 LAB — CBC
HCT: 47.2 % — ABNORMAL HIGH (ref 36.0–46.0)
Hemoglobin: 15.6 g/dL — ABNORMAL HIGH (ref 12.0–15.0)
MCH: 29.4 pg (ref 26.0–34.0)
MCHC: 33.1 g/dL (ref 30.0–36.0)
MCV: 88.9 fL (ref 80.0–100.0)
Platelets: 301 10*3/uL (ref 150–400)
RBC: 5.31 MIL/uL — ABNORMAL HIGH (ref 3.87–5.11)
RDW: 12 % (ref 11.5–15.5)
WBC: 6.9 10*3/uL (ref 4.0–10.5)
nRBC: 0 % (ref 0.0–0.2)

## 2020-02-16 LAB — ABO/RH: ABO/RH(D): O POS

## 2020-02-16 NOTE — Progress Notes (Signed)
PCP - [name] FNP Cardiologist - no  Chest x-ray - no EKG - no Stress Test - no ECHO - no Cardiac Cath - no  Sleep Study - no CPAP -   Fasting Blood Sugar - NA Checks Blood Sugar _____ times a day  Blood Thinner Instructions:NA Aspirin Instructions: Last Dose:  Anesthesia review:   Patient denies shortness of breath, fever, cough and chest pain at PAT appointment yes  Patient verbalized understanding of instructions that were given to them at the PAT appointment. Patient was also instructed that they will need to review over the PAT instructions again at home before surgery. Yes  Pt has Postural orthostatic tachycardia syndrome but hasn't had problems recently.

## 2020-02-21 ENCOUNTER — Other Ambulatory Visit (HOSPITAL_COMMUNITY): Payer: 59

## 2020-02-22 ENCOUNTER — Other Ambulatory Visit: Payer: Self-pay

## 2020-02-22 ENCOUNTER — Other Ambulatory Visit
Admission: RE | Admit: 2020-02-22 | Discharge: 2020-02-22 | Disposition: A | Discharge status: Home / Self Care | Payer: 59 | Source: Ambulatory Visit | Attending: Obstetrics and Gynecology | Admitting: Obstetrics and Gynecology

## 2020-02-22 DIAGNOSIS — Z20822 Contact with and (suspected) exposure to covid-19: Secondary | ICD-10-CM | POA: Insufficient documentation

## 2020-02-22 DIAGNOSIS — Z01812 Encounter for preprocedural laboratory examination: Secondary | ICD-10-CM | POA: Diagnosis not present

## 2020-02-22 LAB — SARS CORONAVIRUS 2 (TAT 6-24 HRS): SARS Coronavirus 2: NEGATIVE

## 2020-02-22 NOTE — H&P (Signed)
[name] is an 34 y.o. female [phone] with pelvic relaxation, needs to splint to have BM.  Has h/o SUI and anterior relaxation - s/p anterior repair and Solyx sling.  D/W pt r/b/a of surgery (LAVH/BS/posterior repair)  Pertinent Gynecological History:.   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 (Menstrual status: IUD).    Past Medical History:  Diagnosis Date  . Anxiety   . Asthma   . Concussion   . Depression   . Dysrhythmia   . Family history of adverse reaction to anesthesia   . GERD (gastroesophageal reflux disease)   . Headache(784.0)    Migraines  . Herpes   . History of kidney stones   . Keratosis   . PONV (postoperative nausea and vomiting)    prolonged sedation  . Postural orthostatic tachycardia syndrome   . Tingling in extremities    occ    Past Surgical History:  Procedure Laterality Date  . BLADDER SUSPENSION N/A 05/01/2016   Procedure: TRANSVAGINAL TAPE (TVT) PROCEDURE;  Surgeon: Cheri Fowler, MD;  Location: [facility];  Service: Gynecology;  Laterality: N/A;  . BREAST ENHANCEMENT SURGERY    . CYSTOCELE REPAIR N/A 05/01/2016   Procedure: ANTERIOR REPAIR (CYSTOCELE);  Surgeon: Cheri Fowler, MD;  Location: [facility];  Service: Gynecology;  Laterality: N/A;    Family History  Problem Relation Age of Onset  . Alcohol abuse Father   . Diabetes Father   . Arthritis Maternal Aunt   . Arthritis Maternal Grandmother   . COPD Maternal Grandmother   . Thyroid disease Maternal Grandmother   . Alcohol abuse Maternal Grandfather   . Cancer Maternal Grandfather        liver, pancreatic  . Cancer Paternal Grandmother        brain  . Alcohol abuse Paternal Grandfather   . Stroke Paternal Grandfather   . Heart disease Neg Hx        early heart disease  . Sudden death Neg Hx        cardiac  .  Cardiomyopathy Neg Hx   . Rectal cancer Neg Hx   . Stomach cancer Neg Hx   . Esophageal cancer Neg Hx   . Colon cancer Neg Hx     Social History:  reports that she has never smoked. She has never used smokeless tobacco. She reports current alcohol use. She reports that she does not use drugs. 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 Prozac [Fluoxetine Hcl] Nausea Only  . Latex Rash    Meds: albuterol, zyrtec, clobetasol, hydrocortisone, Mirena, Zofran, Vyvanse, Valtrex    Review of Systems  Constitutional: Negative.   HENT: Negative.   Eyes: Negative.   Respiratory: Negative.   Cardiovascular: Negative.   Gastrointestinal: Negative.   Genitourinary: Negative.        Pelvic relaxation, rectocoele - splints to have BM  Musculoskeletal: Negative.   Skin: Negative.   Neurological: Negative.   Psychiatric/Behavioral: Negative.     There were no vitals taken for this visit. Physical Exam  Constitutional: She is oriented to person, place, and time. She appears well-developed and well-nourished.  HENT:  Head: Normocephalic and atraumatic.  Cardiovascular: Normal rate and regular rhythm.  Respiratory: Effort normal and breath sounds normal. No respiratory  distress. She has no wheezes.  GI: Soft. Bowel sounds are normal. She exhibits no distension. There is no abdominal tenderness.  Musculoskeletal:        General: Normal range of motion.  Neurological: She is alert and oriented to person, place, and time.  Skin: Skin is warm.  Psychiatric: She has a normal mood and affect. Her behavior is normal.     Assessment/Plan: 33yo [id number] with pelvic relaxation for LAVH/BS and posterior repait Ancef for prophylaxis D/w pt r/b/a of surgery (LAVH/BS/post repair) D/w pt expectations [name] 02/22/2020, 9:16 PM

## 2020-02-24 ENCOUNTER — Observation Stay (HOSPITAL_BASED_OUTPATIENT_CLINIC_OR_DEPARTMENT_OTHER): Payer: 59 | Admitting: Certified Registered"

## 2020-02-24 ENCOUNTER — Observation Stay (HOSPITAL_BASED_OUTPATIENT_CLINIC_OR_DEPARTMENT_OTHER)
Admission: RE | Admit: 2020-02-24 | Discharge: 2020-02-25 | Disposition: A | Discharge status: Home / Self Care | Payer: 59 | Attending: Obstetrics and Gynecology | Admitting: Obstetrics and Gynecology

## 2020-02-24 ENCOUNTER — Other Ambulatory Visit: Payer: Self-pay

## 2020-02-24 ENCOUNTER — Encounter (HOSPITAL_BASED_OUTPATIENT_CLINIC_OR_DEPARTMENT_OTHER): Payer: Self-pay | Admitting: Obstetrics and Gynecology

## 2020-02-24 ENCOUNTER — Encounter (HOSPITAL_BASED_OUTPATIENT_CLINIC_OR_DEPARTMENT_OTHER)
Admission: RE | Disposition: A | Discharge status: Home / Self Care | Payer: Self-pay | Source: Home / Self Care | Attending: Obstetrics and Gynecology

## 2020-02-24 DIAGNOSIS — N816 Rectocele: Secondary | ICD-10-CM | POA: Diagnosis not present

## 2020-02-24 DIAGNOSIS — Z975 Presence of (intrauterine) contraceptive device: Secondary | ICD-10-CM | POA: Diagnosis not present

## 2020-02-24 DIAGNOSIS — N8189 Other female genital prolapse: Secondary | ICD-10-CM | POA: Insufficient documentation

## 2020-02-24 DIAGNOSIS — Z90710 Acquired absence of both cervix and uterus: Secondary | ICD-10-CM | POA: Diagnosis present

## 2020-02-24 DIAGNOSIS — N800 Endometriosis of uterus: Secondary | ICD-10-CM | POA: Diagnosis not present

## 2020-02-24 DIAGNOSIS — Z79899 Other long term (current) drug therapy: Secondary | ICD-10-CM | POA: Insufficient documentation

## 2020-02-24 DIAGNOSIS — K219 Gastro-esophageal reflux disease without esophagitis: Secondary | ICD-10-CM | POA: Diagnosis not present

## 2020-02-24 DIAGNOSIS — Z888 Allergy status to other drugs, medicaments and biological substances status: Secondary | ICD-10-CM | POA: Insufficient documentation

## 2020-02-24 DIAGNOSIS — J45909 Unspecified asthma, uncomplicated: Secondary | ICD-10-CM | POA: Insufficient documentation

## 2020-02-24 DIAGNOSIS — N8110 Cystocele, unspecified: Principal | ICD-10-CM | POA: Insufficient documentation

## 2020-02-24 DIAGNOSIS — F411 Generalized anxiety disorder: Secondary | ICD-10-CM | POA: Diagnosis not present

## 2020-02-24 HISTORY — PX: LAPAROSCOPIC VAGINAL HYSTERECTOMY WITH SALPINGECTOMY: SHX6680

## 2020-02-24 HISTORY — PX: RECTOCELE REPAIR: SHX761

## 2020-02-24 HISTORY — PX: CYSTOSCOPY: SHX5120

## 2020-02-24 LAB — TYPE AND SCREEN
ABO/RH(D): O POS
Antibody Screen: NEGATIVE

## 2020-02-24 LAB — POCT PREGNANCY, URINE: Preg Test, Ur: NEGATIVE

## 2020-02-24 SURGERY — HYSTERECTOMY, VAGINAL, LAPAROSCOPY-ASSISTED, WITH SALPINGECTOMY
Anesthesia: General | Site: Vagina

## 2020-02-24 MED ORDER — DOCUSATE SODIUM 100 MG PO CAPS
ORAL_CAPSULE | ORAL | Status: AC
  Filled 2020-02-24: qty 1

## 2020-02-24 MED ORDER — LIDOCAINE HCL 2 % IJ SOLN
INTRAMUSCULAR | Status: AC
  Filled 2020-02-24: qty 20

## 2020-02-24 MED ORDER — LACTATED RINGERS IV SOLN: INTRAVENOUS | Status: DC

## 2020-02-24 MED ORDER — LORATADINE 10 MG PO TABS
10.0000 mg | ORAL_TABLET | Freq: Every day | ORAL | Status: DC
  Administered 2020-02-24: 10 mg via ORAL

## 2020-02-24 MED ORDER — SODIUM CHLORIDE 0.9 % IR SOLN
Status: DC | PRN
  Administered 2020-02-24: 1000 mL via INTRAVESICAL
  Administered 2020-02-24: 3000 mL

## 2020-02-24 MED ORDER — CEFAZOLIN SODIUM-DEXTROSE 2-4 GM/100ML-% IV SOLN
2.0000 g | INTRAVENOUS | Status: AC
  Administered 2020-02-24: 2 g via INTRAVENOUS

## 2020-02-24 MED ORDER — MENTHOL 3 MG MT LOZG: 1.0000 | LOZENGE | OROMUCOSAL | Status: DC | PRN

## 2020-02-24 MED ORDER — ONDANSETRON HCL 4 MG PO TABS: 4.0000 mg | ORAL_TABLET | Freq: Four times a day (QID) | ORAL | Status: DC | PRN

## 2020-02-24 MED ORDER — KETAMINE HCL 10 MG/ML IJ SOLN
INTRAMUSCULAR | Status: AC
  Filled 2020-02-24: qty 1

## 2020-02-24 MED ORDER — DIPHENHYDRAMINE HCL 50 MG/ML IJ SOLN
INTRAMUSCULAR | Status: AC
  Filled 2020-02-24: qty 1

## 2020-02-24 MED ORDER — OXYCODONE HCL 5 MG PO TABS: 5.0000 mg | ORAL_TABLET | Freq: Once | ORAL | Status: DC | PRN

## 2020-02-24 MED ORDER — FENTANYL CITRATE (PF) 100 MCG/2ML IJ SOLN
25.0000 ug | INTRAMUSCULAR | Status: DC | PRN
  Administered 2020-02-24 (×3): 50 ug via INTRAVENOUS

## 2020-02-24 MED ORDER — ONDANSETRON 4 MG PO TBDP: 8.0000 mg | ORAL_TABLET | Freq: Three times a day (TID) | ORAL | Status: DC | PRN

## 2020-02-24 MED ORDER — ONDANSETRON HCL 4 MG/2ML IJ SOLN
INTRAMUSCULAR | Status: DC | PRN
  Administered 2020-02-24: 4 mg via INTRAVENOUS

## 2020-02-24 MED ORDER — ONDANSETRON HCL 4 MG/2ML IJ SOLN
INTRAMUSCULAR | Status: AC
  Filled 2020-02-24: qty 2

## 2020-02-24 MED ORDER — ALUM & MAG HYDROXIDE-SIMETH 200-200-20 MG/5ML PO SUSP: 30.0000 mL | ORAL | Status: DC | PRN

## 2020-02-24 MED ORDER — CEFAZOLIN SODIUM-DEXTROSE 1-4 GM/50ML-% IV SOLN
INTRAVENOUS | Status: AC
  Filled 2020-02-24: qty 50

## 2020-02-24 MED ORDER — LISDEXAMFETAMINE DIMESYLATE 30 MG PO CAPS
30.0000 mg | ORAL_CAPSULE | Freq: Every day | ORAL | Status: DC
  Filled 2020-02-24: qty 1

## 2020-02-24 MED ORDER — FENTANYL CITRATE (PF) 100 MCG/2ML IJ SOLN
INTRAMUSCULAR | Status: DC | PRN
  Administered 2020-02-24 (×2): 50 ug via INTRAVENOUS
  Administered 2020-02-24: 100 ug via INTRAVENOUS
  Administered 2020-02-24: 50 ug via INTRAVENOUS

## 2020-02-24 MED ORDER — FENTANYL CITRATE (PF) 250 MCG/5ML IJ SOLN
INTRAMUSCULAR | Status: AC
  Filled 2020-02-24: qty 5

## 2020-02-24 MED ORDER — ACETAMINOPHEN 10 MG/ML IV SOLN
INTRAVENOUS | Status: AC
  Filled 2020-02-24: qty 100

## 2020-02-24 MED ORDER — HYDROCODONE-ACETAMINOPHEN 5-325 MG PO TABS
ORAL_TABLET | ORAL | Status: AC
  Filled 2020-02-24: qty 2

## 2020-02-24 MED ORDER — ROCURONIUM BROMIDE 10 MG/ML (PF) SYRINGE
PREFILLED_SYRINGE | INTRAVENOUS | Status: DC | PRN
  Administered 2020-02-24 (×2): 10 mg via INTRAVENOUS
  Administered 2020-02-24: 60 mg via INTRAVENOUS
  Administered 2020-02-24 (×2): 10 mg via INTRAVENOUS

## 2020-02-24 MED ORDER — FENTANYL CITRATE (PF) 100 MCG/2ML IJ SOLN
INTRAMUSCULAR | Status: AC
  Filled 2020-02-24: qty 2

## 2020-02-24 MED ORDER — KETAMINE HCL 10 MG/ML IJ SOLN
INTRAMUSCULAR | Status: DC | PRN
  Administered 2020-02-24: 25 mg via INTRAVENOUS

## 2020-02-24 MED ORDER — PROPOFOL 10 MG/ML IV BOLUS
INTRAVENOUS | Status: AC
  Filled 2020-02-24: qty 20

## 2020-02-24 MED ORDER — DOCUSATE SODIUM 100 MG PO CAPS
100.0000 mg | ORAL_CAPSULE | Freq: Two times a day (BID) | ORAL | Status: DC
  Administered 2020-02-24 (×2): 100 mg via ORAL

## 2020-02-24 MED ORDER — SODIUM CHLORIDE 0.9% FLUSH: 9.0000 mL | INTRAVENOUS | Status: DC | PRN

## 2020-02-24 MED ORDER — KETOROLAC TROMETHAMINE 30 MG/ML IJ SOLN
INTRAMUSCULAR | Status: AC
  Filled 2020-02-24: qty 1

## 2020-02-24 MED ORDER — ALBUTEROL SULFATE HFA 108 (90 BASE) MCG/ACT IN AERS: 2.0000 | INHALATION_SPRAY | RESPIRATORY_TRACT | Status: DC | PRN

## 2020-02-24 MED ORDER — ESTRADIOL 0.1 MG/GM VA CREA
TOPICAL_CREAM | VAGINAL | Status: DC | PRN
  Administered 2020-02-24: 1 via VAGINAL

## 2020-02-24 MED ORDER — NALOXONE HCL 0.4 MG/ML IJ SOLN: 0.4000 mg | INTRAMUSCULAR | Status: DC | PRN

## 2020-02-24 MED ORDER — KETOROLAC TROMETHAMINE 30 MG/ML IJ SOLN
30.0000 mg | Freq: Four times a day (QID) | INTRAMUSCULAR | Status: DC
  Administered 2020-02-24 – 2020-02-25 (×3): 30 mg via INTRAVENOUS

## 2020-02-24 MED ORDER — PROPOFOL 10 MG/ML IV BOLUS
INTRAVENOUS | Status: DC | PRN
  Administered 2020-02-24: 160 mg via INTRAVENOUS

## 2020-02-24 MED ORDER — SODIUM CHLORIDE (PF) 0.9 % IJ SOLN
INTRAMUSCULAR | Status: AC
  Filled 2020-02-24: qty 10

## 2020-02-24 MED ORDER — HYDROMORPHONE HCL 1 MG/ML IJ SOLN: 0.2000 mg | INTRAMUSCULAR | Status: DC | PRN

## 2020-02-24 MED ORDER — GUAIFENESIN 100 MG/5ML PO SOLN
15.0000 mL | ORAL | Status: DC | PRN
  Filled 2020-02-24: qty 15

## 2020-02-24 MED ORDER — CEFAZOLIN SODIUM-DEXTROSE 2-4 GM/100ML-% IV SOLN
INTRAVENOUS | Status: AC
  Filled 2020-02-24: qty 100

## 2020-02-24 MED ORDER — SCOPOLAMINE 1 MG/3DAYS TD PT72: 1.0000 | MEDICATED_PATCH | TRANSDERMAL | Status: DC

## 2020-02-24 MED ORDER — KETOROLAC TROMETHAMINE 30 MG/ML IJ SOLN: 30.0000 mg | Freq: Four times a day (QID) | INTRAMUSCULAR | Status: DC

## 2020-02-24 MED ORDER — ONDANSETRON HCL 4 MG/2ML IJ SOLN: 4.0000 mg | Freq: Four times a day (QID) | INTRAMUSCULAR | Status: DC | PRN

## 2020-02-24 MED ORDER — HYDROMORPHONE HCL 1 MG/ML IJ SOLN
INTRAMUSCULAR | Status: AC
  Filled 2020-02-24: qty 1

## 2020-02-24 MED ORDER — SIMETHICONE 80 MG PO CHEW
80.0000 mg | CHEWABLE_TABLET | Freq: Four times a day (QID) | ORAL | Status: DC | PRN
  Administered 2020-02-24: 80 mg via ORAL

## 2020-02-24 MED ORDER — IBUPROFEN 800 MG PO TABS: 800.0000 mg | ORAL_TABLET | Freq: Three times a day (TID) | ORAL | Status: DC | PRN

## 2020-02-24 MED ORDER — LIDOCAINE 2% (20 MG/ML) 5 ML SYRINGE
INTRAMUSCULAR | Status: AC
  Filled 2020-02-24: qty 5

## 2020-02-24 MED ORDER — ROCURONIUM BROMIDE 10 MG/ML (PF) SYRINGE
PREFILLED_SYRINGE | INTRAVENOUS | Status: AC
  Filled 2020-02-24: qty 10

## 2020-02-24 MED ORDER — HYDROCODONE-ACETAMINOPHEN 5-325 MG PO TABS
1.0000 | ORAL_TABLET | ORAL | Status: DC | PRN
  Administered 2020-02-24: 1 via ORAL
  Administered 2020-02-24 – 2020-02-25 (×4): 2 via ORAL

## 2020-02-24 MED ORDER — DIPHENHYDRAMINE HCL 12.5 MG/5ML PO ELIX: 12.5000 mg | ORAL_SOLUTION | Freq: Four times a day (QID) | ORAL | Status: DC | PRN

## 2020-02-24 MED ORDER — HYDROMORPHONE HCL 1 MG/ML IJ SOLN
0.2500 mg | INTRAMUSCULAR | Status: DC | PRN
  Administered 2020-02-24 (×2): 0.25 mg via INTRAVENOUS
  Administered 2020-02-24: 0.5 mg via INTRAVENOUS

## 2020-02-24 MED ORDER — HYDROCODONE-ACETAMINOPHEN 5-325 MG PO TABS
ORAL_TABLET | ORAL | Status: AC
  Filled 2020-02-24: qty 1

## 2020-02-24 MED ORDER — MIDAZOLAM HCL 2 MG/2ML IJ SOLN
INTRAMUSCULAR | Status: AC
  Filled 2020-02-24: qty 2

## 2020-02-24 MED ORDER — LIDOCAINE 20MG/ML (2%) 15 ML SYRINGE OPTIME
INTRAMUSCULAR | Status: DC | PRN
  Administered 2020-02-24: 1.5 mg/kg/h via INTRAVENOUS

## 2020-02-24 MED ORDER — MIDAZOLAM HCL 5 MG/5ML IJ SOLN
INTRAMUSCULAR | Status: DC | PRN
  Administered 2020-02-24: 2 mg via INTRAVENOUS

## 2020-02-24 MED ORDER — LIDOCAINE 2% (20 MG/ML) 5 ML SYRINGE
INTRAMUSCULAR | Status: DC | PRN
  Administered 2020-02-24: 60 mg via INTRAVENOUS

## 2020-02-24 MED ORDER — VASOPRESSIN 20 UNIT/ML IV SOLN
INTRAVENOUS | Status: DC | PRN
  Administered 2020-02-24: 2.5 [IU]

## 2020-02-24 MED ORDER — LORATADINE 10 MG PO TABS
ORAL_TABLET | ORAL | Status: AC
  Filled 2020-02-24: qty 1

## 2020-02-24 MED ORDER — SCOPOLAMINE 1 MG/3DAYS TD PT72
MEDICATED_PATCH | TRANSDERMAL | Status: AC
  Filled 2020-02-24: qty 1

## 2020-02-24 MED ORDER — FLUORESCEIN SODIUM 10 % IV SOLN
INTRAVENOUS | Status: DC | PRN
  Administered 2020-02-24: 100 mg via INTRAVENOUS

## 2020-02-24 MED ORDER — DIPHENHYDRAMINE HCL 50 MG/ML IJ SOLN: 12.5000 mg | Freq: Four times a day (QID) | INTRAMUSCULAR | Status: DC | PRN

## 2020-02-24 MED ORDER — ACETAMINOPHEN 10 MG/ML IV SOLN
INTRAVENOUS | Status: DC | PRN
  Administered 2020-02-24: 1000 mg via INTRAVENOUS

## 2020-02-24 MED ORDER — DIPHENHYDRAMINE HCL 50 MG/ML IJ SOLN
12.5000 mg | Freq: Once | INTRAMUSCULAR | Status: AC | PRN
  Administered 2020-02-24: 12.5 mg via INTRAVENOUS

## 2020-02-24 MED ORDER — KETOROLAC TROMETHAMINE 30 MG/ML IJ SOLN
30.0000 mg | Freq: Once | INTRAMUSCULAR | Status: AC | PRN
  Administered 2020-02-24: 30 mg via INTRAVENOUS

## 2020-02-24 MED ORDER — OXYCODONE HCL 5 MG/5ML PO SOLN: 5.0000 mg | Freq: Once | ORAL | Status: DC | PRN

## 2020-02-24 MED ORDER — HYDROMORPHONE 1 MG/ML IV SOLN
INTRAVENOUS | Status: DC
  Administered 2020-02-24: 2.4 mg via INTRAVENOUS
  Administered 2020-02-24: 0.6 mg via INTRAVENOUS
  Filled 2020-02-24: qty 30

## 2020-02-24 MED ORDER — SIMETHICONE 80 MG PO CHEW
CHEWABLE_TABLET | ORAL | Status: AC
  Filled 2020-02-24: qty 1

## 2020-02-24 MED ORDER — BUPIVACAINE HCL (PF) 0.25 % IJ SOLN
INTRAMUSCULAR | Status: DC | PRN
  Administered 2020-02-24: 10 mL

## 2020-02-24 MED ORDER — DEXAMETHASONE SODIUM PHOSPHATE 10 MG/ML IJ SOLN
INTRAMUSCULAR | Status: AC
  Filled 2020-02-24: qty 1

## 2020-02-24 MED ORDER — FLUORESCEIN SODIUM 10 % IV SOLN
INTRAVENOUS | Status: AC
  Filled 2020-02-24: qty 5

## 2020-02-24 MED ORDER — DEXAMETHASONE SODIUM PHOSPHATE 10 MG/ML IJ SOLN
INTRAMUSCULAR | Status: DC | PRN
  Administered 2020-02-24: 10 mg via INTRAVENOUS

## 2020-02-24 SURGICAL SUPPLY — 60 items
ADH SKN CLS APL DERMABOND .7 (GAUZE/BANDAGES/DRESSINGS) ×3
APL SRG 38 LTWT LNG FL B (MISCELLANEOUS)
APPLICATOR ARISTA FLEXITIP XL (MISCELLANEOUS) IMPLANT
BLADE SURG 15 STRL LF DISP TIS (BLADE) ×3 IMPLANT
BLADE SURG 15 STRL SS (BLADE) ×4
CABLE HIGH FREQUENCY MONO STRZ (ELECTRODE) IMPLANT
CNTNR URN SCR LID CUP LEK RST (MISCELLANEOUS) ×3 IMPLANT
CONT SPEC 4OZ STRL OR WHT (MISCELLANEOUS) ×4
COVER BACK TABLE 60X90IN (DRAPES) ×4 IMPLANT
COVER MAYO STAND STRL (DRAPES) ×8 IMPLANT
COVER WAND RF STERILE (DRAPES) ×4 IMPLANT
DECANTER SPIKE VIAL GLASS SM (MISCELLANEOUS) IMPLANT
DERMABOND ADVANCED (GAUZE/BANDAGES/DRESSINGS) ×1
DERMABOND ADVANCED .7 DNX12 (GAUZE/BANDAGES/DRESSINGS) ×3 IMPLANT
DRSG COVADERM PLUS 2X2 (GAUZE/BANDAGES/DRESSINGS) IMPLANT
DRSG OPSITE POSTOP 3X4 (GAUZE/BANDAGES/DRESSINGS) IMPLANT
DURAPREP 26ML APPLICATOR (WOUND CARE) ×4 IMPLANT
ELECT REM PT RETURN 9FT ADLT (ELECTROSURGICAL)
ELECTRODE REM PT RTRN 9FT ADLT (ELECTROSURGICAL) IMPLANT
GAUZE 4X4 16PLY RFD (DISPOSABLE) ×4 IMPLANT
GAUZE PACKING 1/2X5YD (GAUZE/BANDAGES/DRESSINGS) ×4 IMPLANT
GLOVE BIO SURGEON STRL SZ 6.5 (GLOVE) ×12 IMPLANT
GOWN STRL REUS W/TWL LRG LVL3 (GOWN DISPOSABLE) ×4 IMPLANT
HEMOSTAT ARISTA ABSORB 3G PWDR (HEMOSTASIS) IMPLANT
HIBICLENS CHG 4% 4OZ (MISCELLANEOUS) ×4 IMPLANT
KIT TURNOVER CYSTO (KITS) ×4 IMPLANT
NDL MAYO CATGUT SZ4 TPR NDL (NEEDLE) IMPLANT
NEEDLE HYPO 22GX1.5 SAFETY (NEEDLE) IMPLANT
NEEDLE INSUFFLATION 120MM (ENDOMECHANICALS) ×4 IMPLANT
NEEDLE MAYO CATGUT SZ4 (NEEDLE) IMPLANT
NS IRRIG 1000ML POUR BTL (IV SOLUTION) ×4 IMPLANT
PACK LAVH (CUSTOM PROCEDURE TRAY) ×4 IMPLANT
PACK TRENDGUARD 450 HYBRID PRO (MISCELLANEOUS) IMPLANT
PACK VAGINAL WOMENS (CUSTOM PROCEDURE TRAY) ×4 IMPLANT
PACKING VAGINAL (PACKING) ×1 IMPLANT
PAD OB MATERNITY 4.3X12.25 (PERSONAL CARE ITEMS) ×4 IMPLANT
PROTECTOR NERVE ULNAR (MISCELLANEOUS) ×8 IMPLANT
SET IRRIG Y TYPE TUR BLADDER L (SET/KITS/TRAYS/PACK) ×4 IMPLANT
SET SUCTION IRRIG HYDROSURG (IRRIGATION / IRRIGATOR) IMPLANT
SET TUBE SMOKE EVAC HIGH FLOW (TUBING) ×4 IMPLANT
SHEARS HARMONIC ACE PLUS 36CM (ENDOMECHANICALS) ×4 IMPLANT
SUT VIC AB 0 CT1 27 (SUTURE) ×4
SUT VIC AB 0 CT1 27XBRD ANBCTR (SUTURE) IMPLANT
SUT VIC AB 1 CT1 18XBRD ANBCTR (SUTURE) ×3 IMPLANT
SUT VIC AB 1 CT1 8-18 (SUTURE) ×8
SUT VIC AB 2-0 CT1 (SUTURE) IMPLANT
SUT VIC AB 2-0 CT2 27 (SUTURE) ×8 IMPLANT
SUT VIC AB 3-0 CT1 27 (SUTURE)
SUT VIC AB 3-0 CT1 TAPERPNT 27 (SUTURE) IMPLANT
SUT VIC AB 3-0 SH 27 (SUTURE) ×8
SUT VIC AB 3-0 SH 27X BRD (SUTURE) ×6 IMPLANT
SUT VIC AB 4-0 PS2 27 (SUTURE) ×4 IMPLANT
SUT VICRYL 0 TIES 12 18 (SUTURE) ×1 IMPLANT
SUT VICRYL 0 UR6 27IN ABS (SUTURE) IMPLANT
SYR 5ML LL (SYRINGE) ×1 IMPLANT
TOWEL OR 17X26 10 PK STRL BLUE (TOWEL DISPOSABLE) ×4 IMPLANT
TRAY FOLEY W/BAG SLVR 14FR (SET/KITS/TRAYS/PACK) ×4 IMPLANT
TRENDGUARD 450 HYBRID PRO PACK (MISCELLANEOUS)
TROCAR BLADELESS OPT 5 100 (ENDOMECHANICALS) ×12 IMPLANT
WARMER LAPAROSCOPE (MISCELLANEOUS) ×4 IMPLANT

## 2020-02-24 NOTE — Anesthesia Procedure Notes (Signed)
Procedure Name: Intubation Date/Time: 02/24/2020 7:36 AM Performed by: Yarexi Pawlicki D, CRNA Pre-anesthesia Checklist: Patient identified, Emergency Drugs available, Suction available and Patient being monitored Patient Re-evaluated:Patient Re-evaluated prior to induction Oxygen Delivery Method: Circle system utilized Preoxygenation: Pre-oxygenation with 100% oxygen Induction Type: IV induction Ventilation: Mask ventilation without difficulty Laryngoscope Size: Mac and 3 Grade View: Grade I Tube type: Oral Number of attempts: 1 Airway Equipment and Method: Stylet Placement Confirmation: ETT inserted through vocal cords under direct vision,  positive ETCO2 and breath sounds checked- equal and bilateral Secured at: 21 cm Tube secured with: Tape Dental Injury: Teeth and Oropharynx as per pre-operative assessment

## 2020-02-24 NOTE — Anesthesia Preprocedure Evaluation (Signed)
Anesthesia Evaluation  Patient identified by MRN, date of birth, ID band Patient awake    Reviewed: Allergy & Precautions, NPO status , Patient's Chart, lab work & pertinent test results  History of Anesthesia Complications (+) PONV  Airway Mallampati: II  TM Distance: >3 FB Neck ROM: Full    Dental no notable dental hx.    Pulmonary asthma ,    Pulmonary exam normal breath sounds clear to auscultation       Cardiovascular negative cardio ROS Normal cardiovascular exam Rhythm:Regular Rate:Normal     Neuro/Psych negative neurological ROS  negative psych ROS   GI/Hepatic negative GI ROS, Neg liver ROS,   Endo/Other  negative endocrine ROS  Renal/GU negative Renal ROS  negative genitourinary   Musculoskeletal negative musculoskeletal ROS (+)   Abdominal   Peds negative pediatric ROS (+)  Hematology negative hematology ROS (+)   Anesthesia Other Findings   Reproductive/Obstetrics negative OB ROS                             Anesthesia Physical Anesthesia Plan  ASA: II  Anesthesia Plan: General   Post-op Pain Management:    Induction: Intravenous  PONV Risk Score and Plan: 4 or greater and Ondansetron, Dexamethasone, Midazolam, Scopolamine patch - Pre-op and Treatment may vary due to age or medical condition  Airway Management Planned: Oral ETT  Additional Equipment:   Intra-op Plan:   Post-operative Plan: Extubation in OR  Informed Consent: I have reviewed the patients History and Physical, chart, labs and discussed the procedure including the risks, benefits and alternatives for the proposed anesthesia with the patient or authorized representative who has indicated his/her understanding and acceptance.     Dental advisory given  Plan Discussed with: CRNA and Surgeon  Anesthesia Plan Comments:         Anesthesia Quick Evaluation

## 2020-02-24 NOTE — Anesthesia Postprocedure Evaluation (Signed)
Anesthesia Post Note  Patient: [name]  Procedure(s) Performed: LAPAROSCOPIC ASSISTED VAGINAL HYSTERECTOMY WITH SALPINGECTOMY (Bilateral Abdomen) POSSIBLE CYSTOSCOPY (N/A Bladder) POSTERIOR REPAIR (RECTOCELE) AND POSSIBLE ANTERIOR REPAIR (N/A Vagina )     Patient location during evaluation: PACU Anesthesia Type: General Level of consciousness: awake and alert Pain management: pain level controlled Vital Signs Assessment: post-procedure vital signs reviewed and stable Respiratory status: spontaneous breathing, nonlabored ventilation, respiratory function stable and patient connected to nasal cannula oxygen Cardiovascular status: blood pressure returned to baseline and stable Postop Assessment: no apparent nausea or vomiting Anesthetic complications: no    Last Vitals:  Vitals:   02/24/20 1130 02/24/20 1145  BP: 127/77 120/68  Pulse: 86 71  Resp: 14 (!) 8  Temp:    SpO2: 100% 99%    Last Pain:  Vitals:   02/24/20 1118  TempSrc:   PainSc: 5                  [name] S

## 2020-02-24 NOTE — Brief Op Note (Signed)
02/24/2020  10:27 AM  PATIENT:  [name]  34 y.o. female  PRE-OPERATIVE DIAGNOSIS:  vaginal prolapse  POST-OPERATIVE DIAGNOSIS:  vaginal prolapse  PROCEDURE:  Procedure(s) with comments: LAPAROSCOPIC ASSISTED VAGINAL HYSTERECTOMY WITH SALPINGECTOMY (Bilateral) CYSTOSCOPY (N/A) - possible POSTERIOR REPAIR (RECTOCELE) (N/A)  SURGEON:  Surgeon(s) and Role:    * Bovard-Stuckert, Kyriaki Moder, MD - Primary  ASSISTANTS: Eula Flax MD   ANESTHESIA:   local and general  EBL:  200 mL IVF and uop per anesthesia  BLOOD ADMINISTERED:none  DRAINS: Urinary Catheter (Foley)   LOCAL MEDICATIONS USED:  MARCAINE    and OTHER Vasopressin  SPECIMEN:  Source of Specimen:  uterus, cervix, B tubes  DISPOSITION OF SPECIMEN:  PATHOLOGY  COUNTS:  YES  TOURNIQUET:  * No tourniquets in log *  DICTATION: .Other Dictation: Dictation Number [id number]  PLAN OF CARE: Admit for overnight observation  PATIENT DISPOSITION:  PACU - hemodynamically stable.   Delay start of Pharmacological VTE agent (>24hrs) due to surgical blood loss or risk of bleeding: not applicable

## 2020-02-24 NOTE — Interval H&P Note (Signed)
History and Physical Interval Note:  02/24/2020 7:12 AM  [name]  has presented today for surgery, with the diagnosis of vaginal prolapse.  The various methods of treatment have been discussed with the patient and family. After consideration of risks, benefits and other options for treatment, the patient has consented to  Procedure(s) with comments: LAPAROSCOPIC ASSISTED VAGINAL HYSTERECTOMY WITH SALPINGECTOMY (Bilateral) POSSIBLE CYSTOSCOPY (N/A) - possible POSTERIOR REPAIR (RECTOCELE) AND POSSIBLE ANTERIOR REPAIR (N/A) as a surgical intervention.  The patient's history has been reviewed, patient examined, no change in status, stable for surgery.  I have reviewed the patient's chart and labs.  Questions were answered to the patient's satisfaction.     Aaban Griep Bovard-Stuckert

## 2020-02-24 NOTE — Progress Notes (Signed)
Day of Surgery Procedure(s) (LRB): LAPAROSCOPIC ASSISTED VAGINAL HYSTERECTOMY WITH SALPINGECTOMY (Bilateral)  CYSTOSCOPY (N/A) POSTERIOR REPAIR (RECTOCELE)   Subjective: Patient reports incisional pain and tolerating PO.  Pain controlled  Pressure in bottom, feels like needs to have BM.  D/W pt packing in vagina.  C/o sharp, crampy pain - intermittently  Objective: I have reviewed patient's vital signs, intake and output and medications.  General: alert and no distress Resp: clear to auscultation bilaterally Cardio: regular rate and rhythm GI: soft, non-tender; bowel sounds normal; no masses,  no organomegaly and incision: clean, dry and intact Extremities: extremities normal, atraumatic, no cyanosis or edema  Assessment: s/p Procedure(s) with comments: LAPAROSCOPIC ASSISTED VAGINAL HYSTERECTOMY WITH SALPINGECTOMY (Bilateral) CYSTOSCOPY (N/A) - possible POSTERIOR REPAIR (RECTOCELE)  stable, progressing well and d/c vag pack and foley in AM  Plan: Advance diet Encourage ambulation  Routine Postop care RN to assess and call at 8:30 pm - may d/c vag pack/foley. Will try to transition to po meds   LOS: 0 days    Geniene List Bovard-Stuckert 02/24/2020, 5:06 PM

## 2020-02-24 NOTE — Interval H&P Note (Signed)
History and Physical Interval Note:  02/24/2020 7:12 AM  [name]  has presented today for surgery, with the diagnosis of vaginal prolapse.  The various methods of treatment have been discussed with the patient and family. After consideration of risks, benefits and other options for treatment, the patient has consented to  Procedure(s) with comments: LAPAROSCOPIC ASSISTED VAGINAL HYSTERECTOMY WITH SALPINGECTOMY (Bilateral) POSSIBLE CYSTOSCOPY (N/A) - possible POSTERIOR REPAIR (RECTOCELE) AND POSSIBLE ANTERIOR REPAIR (N/A) as a surgical intervention.  The patient's history has been reviewed, patient examined, no change in status, stable for surgery.  I have reviewed the patient's chart and labs.  Questions were answered to the patient's satisfaction.     [name]

## 2020-02-24 NOTE — Transfer of Care (Signed)
Immediate Anesthesia Transfer of Care Note  Patient: [name]  Procedure(s) Performed: LAPAROSCOPIC ASSISTED VAGINAL HYSTERECTOMY WITH SALPINGECTOMY (Bilateral Abdomen) POSSIBLE CYSTOSCOPY (N/A Bladder) POSTERIOR REPAIR (RECTOCELE) AND POSSIBLE ANTERIOR REPAIR (N/A Vagina )  Patient Location: PACU  Anesthesia Type:General  Level of Consciousness: awake, alert  and oriented  Airway & Oxygen Therapy: Patient Spontanous Breathing and Patient connected to nasal cannula oxygen  Post-op Assessment: Report given to RN and Post -op Vital signs reviewed and stable  Post vital signs: Reviewed and stable  Last Vitals:  Vitals Value Taken Time  BP 126/77 02/24/20 1036  Temp    Pulse 76 02/24/20 1040  Resp 13 02/24/20 1040  SpO2 100 % 02/24/20 1040  Vitals shown include unvalidated device data.  Last Pain:  Vitals:   02/24/20 0620  TempSrc: Oral  PainSc: 0-No pain      Patients Stated Pain Goal: 5 ([id number])  Complications: No apparent anesthesia complications

## 2020-02-24 NOTE — Op Note (Signed)
[name], [name] MEDICAL RECORD [id number] ACCOUNT [id number] DATE OF BIRTH:1986-04-13 FACILITY: [facility] LOCATION: [facility] PHYSICIAN:Atharva Mirsky BOVARD-STUCKERT, MD  OPERATIVE REPORT  DATE OF PROCEDURE:  02/24/2020  PREOPERATIVE DIAGNOSIS:  Vaginal prolapse with difficulty having a bowel movement.  POSTOPERATIVE DIAGNOSIS:  Vaginal prolapse with difficulty having a bowel movement.   PROCEDURE:  Laparoscopic assisted vaginal hysterectomy with bilateral salpingectomy and cystoscopy as well as posterior vaginal repair.  SURGEON:  Janyth Contes, MD  ASSISTANT: Eula Flax, MD  ANESTHESIA:  Local and general.  ESTIMATED BLOOD LOSS:  200 mL.  INTRAVENOUS FLUIDS:  Per anesthesia.  URINE OUTPUT:  Per anesthesia.  COMPLICATIONS:  None.  PATHOLOGY:  Uterus, bilateral tubes and cervix.  DESCRIPTION OF PROCEDURE:  After informed consent was reviewed with the patient including risks, benefits and alternatives of the surgical procedure, she was transported to the operating room and placed on the table in supine position.  General  anesthesia was induced and found to be adequate.  She was then prepped and draped in the normal sterile fashion.  A Foley catheter was sterilely placed and using an open-sided speculum an acorn manipulator was placed on her uterus.  Gown and gloves were  changed.  Attention was turned to the abdominal portion of the case.  An approximately 1 cm infraumbilical incision was made using a Veress needle.  Pneumoperitoneum was obtained after passing the hanging drop test with an opening pressure of less than  5.  Accesory ports were placed in both the right and left under direct visualization.  The uterus was noted to be retroverted and very boggy, likely adenomyomatous.  Using a single-tooth tenaculum the manipulator that had been placed in the uterus and a  million dollar grasper, her uterus was manipulated.  The tubes were identified bilaterally.  They were  excised from the fimbriated end to the cornua.  The cardinal ligaments and mesosalpinx were taken down to the level of the cervix.  A bladder flap was  created and met in the midline.  Similar procedure was performed on both the right and left.  Dr Wilhelmenia Blase was scrubbed to help with the more difficult portions of this case since it was necessary for the case.  Attention was then turned to the vaginal portion  of the case.  The uterus was circumscribed with Bovy cautery after vasopressin was injected and the anterior peritoneum was entered sharply.  The posterior peritoneum was then entered.  Uterosacral ligaments were ligated bilaterally and held in a stepwise fashion.  The  pedicles were obtained to deliver the uterus from the vagina.  Several areas that were bleeding were controlled with 3-0 Vicryl Rapide.  The cuff was closed as the uterosacral ligaments were plicated.  A posterior repair was performed first injected with  vasopressin then the mucosa was separated in the midline.  The tissue was dissected from the mucosa.  The tissue was reapproximated in the midline with running suture.  The mucosa was trimmed and the mucosa was closed.  On completion, a better filling  support was noted for the rectovaginal area.  A cystoscopy was performed revealing bilateral jets and copious urine.  The patient tolerated the procedure.  The vagina was packed with Betadine-soaked gauze and gloves and gowns were changed.  Attention was  turned to the abdominal portion of the case.  The pedicles were noted to be hemostatic.  There was nothing else noted.  No bleeding.  The accessory ports were removed under direct visualization.  The gas was evacuated  from the peritoneum and each port  was closed with a single suture of 4-0 and Dermabond was applied.  The patient tolerated the procedure well.  Sponge, lap, and needle count was correct x 2 per operating staff.  CN/NUANCE  D:02/24/2020 T:02/24/2020 JOB:[id number]

## 2020-02-25 DIAGNOSIS — N816 Rectocele: Secondary | ICD-10-CM | POA: Diagnosis not present

## 2020-02-25 DIAGNOSIS — Z975 Presence of (intrauterine) contraceptive device: Secondary | ICD-10-CM | POA: Diagnosis not present

## 2020-02-25 DIAGNOSIS — N8110 Cystocele, unspecified: Secondary | ICD-10-CM | POA: Diagnosis not present

## 2020-02-25 DIAGNOSIS — Z888 Allergy status to other drugs, medicaments and biological substances status: Secondary | ICD-10-CM | POA: Diagnosis not present

## 2020-02-25 DIAGNOSIS — J45909 Unspecified asthma, uncomplicated: Secondary | ICD-10-CM | POA: Diagnosis not present

## 2020-02-25 DIAGNOSIS — N800 Endometriosis of uterus: Secondary | ICD-10-CM | POA: Diagnosis not present

## 2020-02-25 DIAGNOSIS — Z79899 Other long term (current) drug therapy: Secondary | ICD-10-CM | POA: Diagnosis not present

## 2020-02-25 DIAGNOSIS — N8189 Other female genital prolapse: Secondary | ICD-10-CM | POA: Diagnosis not present

## 2020-02-25 LAB — BASIC METABOLIC PANEL
Anion gap: 9 (ref 5–15)
BUN: 11 mg/dL (ref 6–20)
CO2: 25 mmol/L (ref 22–32)
Calcium: 8.3 mg/dL — ABNORMAL LOW (ref 8.9–10.3)
Chloride: 105 mmol/L (ref 98–111)
Creatinine, Ser: 0.86 mg/dL (ref 0.44–1.00)
GFR calc Af Amer: >60 mL/min (ref >60)
GFR calc non Af Amer: >60 mL/min (ref >60)
Glucose, Bld: 138 mg/dL — ABNORMAL HIGH (ref 70–99)
Potassium: 3.9 mmol/L (ref 3.5–5.1)
Sodium: 139 mmol/L (ref 135–145)

## 2020-02-25 LAB — CBC
HCT: 40.9 % (ref 36.0–46.0)
Hemoglobin: 13.7 g/dL (ref 12.0–15.0)
MCH: 30.3 pg (ref 26.0–34.0)
MCHC: 33.5 g/dL (ref 30.0–36.0)
MCV: 90.5 fL (ref 80.0–100.0)
Platelets: 303 10*3/uL (ref 150–400)
RBC: 4.52 MIL/uL (ref 3.87–5.11)
RDW: 12.5 % (ref 11.5–15.5)
WBC: 24.1 10*3/uL — ABNORMAL HIGH (ref 4.0–10.5)
nRBC: 0 % (ref 0.0–0.2)

## 2020-02-25 LAB — SURGICAL PATHOLOGY

## 2020-02-25 MED ORDER — HYDROCODONE-ACETAMINOPHEN 5-325 MG PO TABS
ORAL_TABLET | ORAL | Status: AC
  Filled 2020-02-25: qty 2

## 2020-02-25 MED ORDER — KETOROLAC TROMETHAMINE 30 MG/ML IJ SOLN
INTRAMUSCULAR | Status: AC
  Filled 2020-02-25: qty 1

## 2020-02-25 MED ORDER — IBUPROFEN 800 MG PO TABS: 800.0000 mg | ORAL_TABLET | Freq: Three times a day (TID) | ORAL | 1 refills | Status: DC | PRN

## 2020-02-25 MED ORDER — DOCUSATE SODIUM 100 MG PO CAPS: 100.0000 mg | ORAL_CAPSULE | Freq: Two times a day (BID) | ORAL | 1 refills | Status: DC

## 2020-02-25 MED ORDER — HYDROCODONE-ACETAMINOPHEN 5-325 MG PO TABS: 1.0000 | ORAL_TABLET | ORAL | 0 refills | Status: DC | PRN

## 2020-02-25 NOTE — Discharge Summary (Signed)
Physician Discharge Summary  Patient ID: [name] MRN: [id number] DOB/AGE: December 24, 1985 34 y.o.  Admit date: 02/24/2020 Discharge date: 02/25/2020  Admission Diagnoses: pelvic relaxation  Discharge Diagnoses:  Principal Problem:   Status post laparoscopic assisted vaginal hysterectomy (LAVH) posterior repair  Discharged Condition: good  Hospital Course: Admitted for surgery 5/27, underwent LAVH/BS/post repair/cystoscopy without difficulty or complication.  D/C to home POD#1 ambulating, voiding, tolerating po and pain controlled  Consults: None  Significant Diagnostic Studies: labs: CBC, BMP  Treatments: surgery: LAVH/BS/cystoscopy/posterior repair  Discharge Exam: Blood pressure 105/70, pulse 76, temperature 98 F (36.7 C), resp. rate 16, height 5\' 2"  (1.575 m), weight 79.6 kg, SpO2 99 %. General appearance: alert and no distress Resp: clear to auscultation bilaterally Cardio: regular rate and rhythm GI: soft, non-tender; bowel sounds normal; no masses,  no organomegaly Extremities: extremities normal, atraumatic, no cyanosis or edema  Disposition: Discharge disposition: 01-Home or Self Care       Discharge Instructions    Call MD for:  persistant nausea and vomiting   Complete by: As directed    Call MD for:  redness, tenderness, or signs of infection (pain, swelling, redness, odor or green/yellow discharge around incision site)   Complete by: As directed    Call MD for:  severe uncontrolled pain   Complete by: As directed    Diet - low sodium heart healthy   Complete by: As directed    Discharge instructions   Complete by: As directed    Call [phone] with questions or problems   Driving Restrictions   Complete by: As directed    While taking strong pain medicine   Increase activity slowly   Complete by: As directed    Lifting restrictions   Complete by: As directed    No greater than 10-15lbs for 6-8 weeks   May shower / Bathe   Complete by: As  directed    May walk up steps   Complete by: As directed    Sexual Activity Restrictions   Complete by: As directed    Pelvic rest - no douching, tampons or sex for 6 weeks     Allergies as of 02/25/2020      Reactions   Prozac [fluoxetine Hcl] Nausea Only   Latex Rash      Medication List    STOP taking these medications   Mirena (52 MG) 20 MCG/24HR IUD Generic drug: levonorgestrel     TAKE these medications   albuterol 108 (90 Base) MCG/ACT inhaler Commonly known as: VENTOLIN HFA USE 1 - 2 PUFFS EVERY 6 HOURS AS NEEDED (Needs to be seen before next refill)   cetirizine 10 MG tablet Commonly known as: ZYRTEC Take 10 mg by mouth 2 (two) times daily.   clobetasol ointment 0.05 % Commonly known as: TEMOVATE Apply 1 application topically 2 (two) times daily as needed (irritation). What changed: Another medication with the same name was changed. Make sure you understand how and when to take each.   clobetasol cream 0.05 % Commonly known as: TEMOVATE Apply 1 application topically 2 (two) times daily. What changed:   when to take this  reasons to take this   docusate sodium 100 MG capsule Commonly known as: COLACE Take 1 capsule (100 mg total) by mouth 2 (two) times daily.   HYDROcodone-acetaminophen 5-325 MG tablet Commonly known as: NORCO/VICODIN Take 1 tablet by mouth every 4 (four) hours as needed for moderate pain.   ibuprofen 800 MG tablet Commonly known as: ADVIL  Take 1 tablet (800 mg total) by mouth every 8 (eight) hours as needed for moderate pain (mild pain).   ondansetron 4 MG disintegrating tablet Commonly known as: ZOFRAN-ODT DISSOLVE 1 TABLET BY MOUTH EVERY 8 HOURS AS NEEDED FOR NAUSEA OR VOMITING   scopolamine 1 MG/3DAYS Commonly known as: Transderm-Scop (1.5 MG) Place 1 patch (1.5 mg total) onto the skin every 3 (three) days.   Vyvanse 10 MG capsule Generic drug: lisdexamfetamine Take 10 mg by mouth daily as needed (extra focus).   Vyvanse  30 MG capsule Generic drug: lisdexamfetamine Take 30 mg by mouth daily.      Follow-up Information    Bovard-Stuckert, Berdella Bacot, MD. Schedule an appointment as soon as possible for a visit in 2 week(s).   Specialty: Obstetrics and Gynecology Why: for incision check and 6 weeks for full post-op Contact information: [address] [place] [address] [phone]           Signed: Janyth Contes 02/25/2020, 7:46 AM

## 2020-02-25 NOTE — Progress Notes (Signed)
Vaginal packing removed with small amt of sanguineous drainage noted. Foley catheter removed. Pt tolerated both procedures well.

## 2020-02-25 NOTE — Discharge Instructions (Signed)
Laparoscopically Assisted Vaginal Hysterectomy, Care After This sheet gives you information about how to care for yourself after your procedure. Your health care provider may also give you more specific instructions. If you have problems or questions, contact your health care provider. What can I expect after the procedure? After the procedure, it is common to have:  Soreness and numbness in your incision areas.  Abdominal pain. You will be given pain medicine to control it.  Vaginal bleeding and discharge. You will need to use a sanitary napkin after this procedure.  Sore throat from the breathing tube that was inserted during surgery. Follow these instructions at home: Medicines  Take over-the-counter and prescription medicines only as told by your health care provider.  Do not take aspirin or ibuprofen. These medicines can cause bleeding.  Do not drive or use heavy machinery while taking prescription pain medicine.  Do not drive for 24 hours if you were given a medicine to help you relax (sedative) during the procedure. Incision care   Follow instructions from your health care provider about how to take care of your incisions. Make sure you: ? Wash your hands with soap and water before you change your bandage (dressing). If soap and water are not available, use hand sanitizer. ? Change your dressing as told by your health care provider. ? Leave stitches (sutures), skin glue, or adhesive strips in place. These skin closures may need to stay in place for 2 weeks or longer. If adhesive strip edges start to loosen and curl up, you may trim the loose edges. Do not remove adhesive strips completely unless your health care provider tells you to do that.  Check your incision area every day for signs of infection. Check for: ? Redness, swelling, or pain. ? Fluid or blood. ? Warmth. ? Pus or a bad smell. Activity  Get regular exercise as told by your health care provider. You may be  told to take short walks every day and go farther each time.  Return to your normal activities as told by your health care provider. Ask your health care provider what activities are safe for you.  Do not douche, use tampons, or have sexual intercourse for at least 6 weeks, or until your health care provider gives you permission.  Do not lift anything that is heavier than 10 lb (4.5 kg), or the limit that your health care provider tells you, until he or she says that it is safe. General instructions  Do not take baths, swim, or use a hot tub until your health care provider approves. Take showers instead of baths.  Do not drive for 24 hours if you received a sedative.  Do not drive or operate heavy machinery while taking prescription pain medicine.  To prevent or treat constipation while you are taking prescription pain medicine, your health care provider may recommend that you: ? Drink enough fluid to keep your urine clear or pale yellow. ? Take over-the-counter or prescription medicines. ? Eat foods that are high in fiber, such as fresh fruits and vegetables, whole grains, and beans. ? Limit foods that are high in fat and processed sugars, such as fried and sweet foods.  Keep all follow-up visits as told by your health care provider. This is important. Contact a health care provider if:  You have signs of infection, such as: ? Redness, swelling, or pain around your incision sites. ? Fluid or blood coming from an incision. ? An incision that feels warm to the   touch. ? Pus or a bad smell coming from an incision.  Your incision breaks open.  Your pain medicine is not helping.  You feel dizzy or light-headed.  You have pain or bleeding when you urinate.  You have persistent nausea and vomiting.  You have blood, pus, or a bad-smelling discharge from your vagina. Get help right away if:  You have a fever.  You have severe abdominal pain.  You have chest pain.  You have  shortness of breath.  You faint.  You have pain, swelling, or redness in your leg.  You have heavy bleeding from your vagina. Summary  After the procedure, it is common to have abdominal pain and vaginal bleeding.  You should not drive or lift heavy objects until your health care provider says that it is safe.  Contact your health care provider if you have any symptoms of infection, excessive vaginal bleeding, nausea, vomiting, or shortness of breath. This information is not intended to replace advice given to you by your health care provider. Make sure you discuss any questions you have with your health care provider. Document Revised: 08/29/2017 Document Reviewed: 11/12/2016 Elsevier Patient Education  2020 [employer]  

## 2020-02-25 NOTE — Progress Notes (Signed)
1 Day Post-Op Procedure(s) (LRB): LAPAROSCOPIC ASSISTED VAGINAL HYSTERECTOMY WITH SALPINGECTOMY (Bilateral) CYSTOSCOPY (N/A) POSTERIOR REPAIR (RECTOCELE)   Subjective: Patient reports incisional pain and tolerating PO.  Pain controlled.    WBC elevated, but AF, tol po, + BS/gas  Objective: I have reviewed patient's vital signs, intake and output, medications and labs.  General: alert and no distress Resp: clear to auscultation bilaterally Cardio: regular rate and rhythm GI: soft, non-tender; bowel sounds normal; no masses,  no organomegaly Extremities: extremities normal, atraumatic, no cyanosis or edema Vaginal Bleeding: minimal  Assessment: s/p Procedure(s) with comments: LAPAROSCOPIC ASSISTED VAGINAL HYSTERECTOMY WITH SALPINGECTOMY (Bilateral) POSSIBLE CYSTOSCOPY (N/A) - possible POSTERIOR REPAIR (RECTOCELE) AND POSSIBLE ANTERIOR REPAIR (N/A): stable and progressing well  Plan: Advance diet Encourage ambulation Discharge home with motrin and Vicodin, [place].  F/u 2 and 6 weeks  LOS: 0 days    [name] 02/25/2020, 7:28 AM

## 2020-04-05 DIAGNOSIS — Z87898 Personal history of other specified conditions: Secondary | ICD-10-CM

## 2020-04-05 MED ORDER — SCOPOLAMINE 1 MG/3DAYS TD PT72: 1.0000 | MEDICATED_PATCH | TRANSDERMAL | 1 refills | Status: AC

## 2020-04-05 NOTE — Telephone Encounter (Signed)
Covering pcp please advise. 

## 2020-04-06 MED FILL — SCOPOLAMINE 1 MG/3DAYS PT72: 1 | 30 days supply | Qty: 10 | Fill #0

## 2020-04-11 MED FILL — VYVANSE 30 MG CAPSULE: 30 | 30 days supply | Qty: 30 | Fill #0

## 2020-05-16 DIAGNOSIS — Z79899 Other long term (current) drug therapy: Secondary | ICD-10-CM | POA: Diagnosis not present

## 2020-05-16 DIAGNOSIS — F419 Anxiety disorder, unspecified: Secondary | ICD-10-CM | POA: Diagnosis not present

## 2020-05-16 DIAGNOSIS — F902 Attention-deficit hyperactivity disorder, combined type: Secondary | ICD-10-CM | POA: Diagnosis not present

## 2020-05-16 MED FILL — VYVANSE 30 MG CAPSULE: 30 | 30 days supply | Qty: 30 | Fill #0

## 2020-05-21 ENCOUNTER — Emergency Department
Admission: EM | Admit: 2020-05-21 | Discharge: 2020-05-21 | Disposition: A | Discharge status: Home / Self Care | Payer: 59 | Attending: Emergency Medicine | Admitting: Emergency Medicine

## 2020-05-21 ENCOUNTER — Other Ambulatory Visit: Payer: Self-pay

## 2020-05-21 ENCOUNTER — Emergency Department: Payer: 59

## 2020-05-21 DIAGNOSIS — Z9882 Breast implant status: Secondary | ICD-10-CM | POA: Diagnosis not present

## 2020-05-21 DIAGNOSIS — N281 Cyst of kidney, acquired: Secondary | ICD-10-CM | POA: Diagnosis not present

## 2020-05-21 DIAGNOSIS — M549 Dorsalgia, unspecified: Secondary | ICD-10-CM | POA: Diagnosis not present

## 2020-05-21 DIAGNOSIS — E278 Other specified disorders of adrenal gland: Secondary | ICD-10-CM | POA: Diagnosis not present

## 2020-05-21 DIAGNOSIS — N1330 Unspecified hydronephrosis: Secondary | ICD-10-CM | POA: Diagnosis not present

## 2020-05-21 DIAGNOSIS — J45909 Unspecified asthma, uncomplicated: Secondary | ICD-10-CM | POA: Insufficient documentation

## 2020-05-21 DIAGNOSIS — Z20822 Contact with and (suspected) exposure to covid-19: Secondary | ICD-10-CM | POA: Diagnosis not present

## 2020-05-21 DIAGNOSIS — R1012 Left upper quadrant pain: Secondary | ICD-10-CM | POA: Diagnosis not present

## 2020-05-21 DIAGNOSIS — Z90710 Acquired absence of both cervix and uterus: Secondary | ICD-10-CM | POA: Diagnosis not present

## 2020-05-21 LAB — COMPREHENSIVE METABOLIC PANEL
ALT: 22 U/L (ref 0–44)
AST: 19 U/L (ref 15–41)
Albumin: 4.6 g/dL (ref 3.5–5.0)
Alkaline Phosphatase: 60 U/L (ref 38–126)
Anion gap: 8 (ref 5–15)
BUN: 13 mg/dL (ref 6–20)
CO2: 28 mmol/L (ref 22–32)
Calcium: 9.2 mg/dL (ref 8.9–10.3)
Chloride: 103 mmol/L (ref 98–111)
Creatinine, Ser: 0.86 mg/dL (ref 0.44–1.00)
GFR calc Af Amer: >60 mL/min (ref >60)
GFR calc non Af Amer: >60 mL/min (ref >60)
Glucose, Bld: 95 mg/dL (ref 70–99)
Potassium: 4 mmol/L (ref 3.5–5.1)
Sodium: 139 mmol/L (ref 135–145)
Total Bilirubin: 0.8 mg/dL (ref 0.3–1.2)
Total Protein: 7.6 g/dL (ref 6.5–8.1)

## 2020-05-21 LAB — URINALYSIS, COMPLETE (UACMP) WITH MICROSCOPIC
Bacteria, UA: NONE SEEN
Bilirubin Urine: NEGATIVE
Glucose, UA: NEGATIVE mg/dL
Hgb urine dipstick: NEGATIVE
Ketones, ur: NEGATIVE mg/dL
Leukocytes,Ua: NEGATIVE
Nitrite: NEGATIVE
Protein, ur: NEGATIVE mg/dL
Specific Gravity, Urine: 1.001 — ABNORMAL LOW (ref 1.005–1.030)
pH: 8 (ref 5.0–8.0)

## 2020-05-21 LAB — CBC
HCT: 43.5 % (ref 36.0–46.0)
Hemoglobin: 15.4 g/dL — ABNORMAL HIGH (ref 12.0–15.0)
MCH: 30.3 pg (ref 26.0–34.0)
MCHC: 35.4 g/dL (ref 30.0–36.0)
MCV: 85.6 fL (ref 80.0–100.0)
Platelets: 295 10*3/uL (ref 150–400)
RBC: 5.08 MIL/uL (ref 3.87–5.11)
RDW: 12 % (ref 11.5–15.5)
WBC: 11.6 10*3/uL — ABNORMAL HIGH (ref 4.0–10.5)
nRBC: 0 % (ref 0.0–0.2)

## 2020-05-21 LAB — LIPASE, BLOOD: Lipase: 31 U/L (ref 11–51)

## 2020-05-21 LAB — SARS CORONAVIRUS 2 BY RT PCR (HOSPITAL ORDER, PERFORMED IN ~~LOC~~ HOSPITAL LAB): SARS Coronavirus 2: NEGATIVE

## 2020-05-21 MED ORDER — IOHEXOL 350 MG/ML SOLN
75.0000 mL | Freq: Once | INTRAVENOUS | Status: AC | PRN
  Administered 2020-05-21: 75 mL via INTRAVENOUS
  Filled 2020-05-21: qty 75

## 2020-05-21 MED ORDER — DICYCLOMINE HCL 10 MG PO CAPS: 10.0000 mg | ORAL_CAPSULE | Freq: Four times a day (QID) | ORAL | 0 refills | Status: DC

## 2020-05-21 NOTE — ED Provider Notes (Signed)
Emergency Department Provider Note  ____________________________________________  Time seen: Approximately 7:41 PM  I have reviewed the triage vital signs and the nursing notes.   HISTORY  Chief Complaint Abdominal Pain   Historian Patient     HPI [name] is a 34 y.o. female with a history of nephrolithiasis and GERD, presents to the emergency department with sharp left upper quadrant abdominal pain that started yesterday.  Patient describes that she rolled over in bed and felt pain start.  She states that the pain seems to radiate to the back.  She states that pain is worsened sometimes by changes in position and with palpation.  She denies fever and chills.  No dysuria, hematuria or increased urinary frequency.  No emesis or diarrhea.  She denies falls or mechanisms of trauma.  Patient is status post hysterectomy.  She states that she had a normal bowel movement yesterday and has been able to pass gas.  She denies chest pain or chest tightness but states that she feels left upper quadrant pain when she takes a deep breath.   Past Medical History:  Diagnosis Date  . Anxiety   . Asthma   . Concussion   . Depression   . Dysrhythmia   . Family history of adverse reaction to anesthesia   . GERD (gastroesophageal reflux disease)   . Headache(784.0)    Migraines  . Herpes   . History of kidney stones   . Keratosis   . PONV (postoperative nausea and vomiting)    prolonged sedation  . Postural orthostatic tachycardia syndrome   . Tingling in extremities    occ     Immunizations up to date:  Yes.     Past Medical History:  Diagnosis Date  . Anxiety   . Asthma   . Concussion   . Depression   . Dysrhythmia   . Family history of adverse reaction to anesthesia   . GERD (gastroesophageal reflux disease)   . Headache(784.0)    Migraines  . Herpes   . History of kidney stones   . Keratosis   . PONV (postoperative nausea and vomiting)    prolonged sedation  .  Postural orthostatic tachycardia syndrome   . Tingling in extremities    occ    Patient Active Problem List   Diagnosis Date Noted  . Status post laparoscopic assisted vaginal hysterectomy (LAVH) 02/24/2020  . Chronic venous insufficiency 12/23/2019  . Erythrocytosis 07/13/2019  . Swelling 06/12/2016  . Attention deficit hyperactivity disorder (ADHD), predominantly inattentive type 05/22/2016  . BMI 33.0-33.9,adult 05/22/2016  . Overweight (BMI 25.0-29.9) 05/22/2016  . Generalized anxiety disorder 07/19/2015  . Stress incontinence 07/19/2015  . Allergic rhinitis 02/14/2015  . GERD (gastroesophageal reflux disease) 02/14/2015  . Asthma, chronic 12/19/2014  . Simple ovarian cyst 01/19/2014    Past Surgical History:  Procedure Laterality Date  . BLADDER SUSPENSION N/A 05/01/2016   Procedure: TRANSVAGINAL TAPE (TVT) PROCEDURE;  Surgeon: Cheri Fowler, MD;  Location: [facility];  Service: Gynecology;  Laterality: N/A;  . BREAST ENHANCEMENT SURGERY    . CYSTOCELE REPAIR N/A 05/01/2016   Procedure: ANTERIOR REPAIR (CYSTOCELE);  Surgeon: Cheri Fowler, MD;  Location: [facility];  Service: Gynecology;  Laterality: N/A;  . CYSTOSCOPY N/A 02/24/2020   Procedure: POSSIBLE CYSTOSCOPY;  Surgeon: Janyth Contes, MD;  Location: [place] [facility];  Service: Gynecology;  Laterality: N/A;  possible  . LAPAROSCOPIC VAGINAL HYSTERECTOMY WITH SALPINGECTOMY Bilateral 02/24/2020   Procedure: LAPAROSCOPIC ASSISTED VAGINAL HYSTERECTOMY WITH SALPINGECTOMY;  Surgeon:  Bovard-Stuckert, Jeral Fruit, MD;  [facility];  Service: Gynecology;  Laterality: Bilateral;  . RECTOCELE REPAIR N/A 02/24/2020   Procedure: POSTERIOR REPAIR (RECTOCELE) AND POSSIBLE ANTERIOR REPAIR;  Surgeon: Janyth Contes, MD;  Location: [place] [place];  Service: Gynecology;  Laterality: N/A;    Prior to Admission medications   Medication Sig Start Date End Date Taking? Authorizing Provider   albuterol (VENTOLIN HFA) 108 (90 Base) MCG/ACT inhaler USE 1 - 2 PUFFS EVERY 6 HOURS AS NEEDED (Needs to be seen before next refill) 02/08/20   Janora Norlander, DO  cetirizine (ZYRTEC) 10 MG tablet Take 10 mg by mouth 2 (two) times daily.  10/29/19   [provider]  clobetasol cream (TEMOVATE) 5.09 % Apply 1 application topically 2 (two) times daily. Patient taking differently: Apply 1 application topically 2 (two) times daily as needed (irritation).  07/20/19   Sharion Balloon, FNP  clobetasol ointment (TEMOVATE) 3.26 % Apply 1 application topically 2 (two) times daily as needed (irritation).    [provider]  dicyclomine (BENTYL) 10 MG capsule Take 1 capsule (10 mg total) by mouth 4 (four) times daily for 14 days. 05/21/20 06/04/20  Lannie Fields, PA-C  docusate sodium (COLACE) 100 MG capsule Take 1 capsule (100 mg total) by mouth 2 (two) times daily. 02/25/20   Bovard-Stuckert, Jeral Fruit, MD  HYDROcodone-acetaminophen (NORCO/VICODIN) 5-325 MG tablet Take 1 tablet by mouth every 4 (four) hours as needed for moderate pain. 02/25/20   Bovard-Stuckert, Jeral Fruit, MD  ibuprofen (ADVIL) 800 MG tablet Take 1 tablet (800 mg total) by mouth every 8 (eight) hours as needed for moderate pain (mild pain). 02/25/20   Bovard-Stuckert, Jody, MD  ondansetron (ZOFRAN-ODT) 4 MG disintegrating tablet DISSOLVE 1 TABLET BY MOUTH EVERY 8 HOURS AS NEEDED FOR NAUSEA OR VOMITING 02/10/20   [name] A, FNP  scopolamine (TRANSDERM-SCOP, 1.5 MG,) 1 MG/3DAYS Place 1 patch (1.5 mg total) onto the skin every 3 (three) days. 04/05/20   Loman Brooklyn, FNP  VYVANSE 10 MG capsule Take 10 mg by mouth daily as needed (extra focus).  01/27/18   [provider]  VYVANSE 30 MG capsule Take 30 mg by mouth daily.  02/24/18   [provider]    Allergies Prozac [fluoxetine hcl] and Latex  Family History  Problem Relation Age of Onset  . Alcohol abuse Father   . Diabetes Father   . Arthritis Maternal  Aunt   . Arthritis Maternal Grandmother   . COPD Maternal Grandmother   . Thyroid disease Maternal Grandmother   . Alcohol abuse Maternal Grandfather   . Cancer Maternal Grandfather        liver, pancreatic  . Cancer Paternal Grandmother        brain  . Alcohol abuse Paternal Grandfather   . Stroke Paternal Grandfather   . Heart disease Neg Hx        early heart disease  . Sudden death Neg Hx        cardiac  . Cardiomyopathy Neg Hx   . Rectal cancer Neg Hx   . Stomach cancer Neg Hx   . Esophageal cancer Neg Hx   . Colon cancer Neg Hx     Social History Social History   Tobacco Use  . Smoking status: Never Smoker  . Smokeless tobacco: Never Used  Vaping Use  . Vaping Use: Never used  Substance Use Topics  . Alcohol use: Not Currently    Comment: occas  .  Drug use: No     Review of Systems  Constitutional: No fever/chills Eyes:  No discharge ENT: No upper respiratory complaints. Respiratory: no cough. No SOB/ use of accessory muscles to breath Gastrointestinal: Patient has abdominal pain.  Musculoskeletal: Negative for musculoskeletal pain. Skin: Negative for rash, abrasions, lacerations, ecchymosis.    ____________________________________________   PHYSICAL EXAM:  VITAL SIGNS: ED Triage Vitals  Enc Vitals Group     BP 05/21/20 1904 (!) 140/93     Pulse Rate 05/21/20 1904 81     Resp 05/21/20 1904 18     Temp 05/21/20 1904 98.2 F (36.8 C)     Temp Source 05/21/20 1904 Oral     SpO2 05/21/20 1904 100 %     Weight 05/21/20 1908 175 lb (79.4 kg)     Height --      Head Circumference --      Peak Flow --      Pain Score 05/21/20 1907 0     Pain Loc --      Pain Edu? --      Excl. in [place]? --      Constitutional: Alert and oriented. Well appearing and in no acute distress. Eyes: Conjunctivae are normal. PERRL. EOMI. Head: Atraumatic. Cardiovascular: Normal rate, regular rhythm. Normal S1 and S2.  Good peripheral circulation. Respiratory: Normal  respiratory effort without tachypnea or retractions. Lungs CTAB. Good air entry to the bases with no decreased or absent breath sounds Gastrointestinal: Bowel sounds x 4 quadrants. Patient has LUQ abdominal tenderness to palpation without guarding. No distention. Musculoskeletal: Full range of motion to all extremities. No obvious deformities noted Neurologic:  Normal for age. No gross focal neurologic deficits are appreciated.  Skin:  Skin is warm, dry and intact. No rash noted. Psychiatric: Mood and affect are normal for age. Speech and behavior are normal.   ____________________________________________   LABS (all labs ordered are listed, but only abnormal results are displayed)  Labs Reviewed  CBC - Abnormal; Notable for the following components:      Result Value   WBC 11.6 (*)    Hemoglobin 15.4 (*)    All other components within normal limits  URINALYSIS, COMPLETE (UACMP) WITH MICROSCOPIC - Abnormal; Notable for the following components:   Color, Urine COLORLESS (*)    APPearance CLEAR (*)    Specific Gravity, Urine 1.001 (*)    All other components within normal limits  SARS CORONAVIRUS 2 BY RT PCR (HOSPITAL ORDER, [facility])  LIPASE, BLOOD  COMPREHENSIVE METABOLIC PANEL   ____________________________________________  EKG   ____________________________________________  RADIOLOGY Unk Pinto, personally viewed and evaluated these images (plain radiographs) as part of my medical decision making, as well as reviewing the written report by the radiologist.    CT Angio Chest PE W and/or Wo Contrast  Result Date: 05/21/2020 CLINICAL DATA:  Sharp back pain. EXAM: CT ANGIOGRAPHY CHEST WITH CONTRAST TECHNIQUE: Multidetector CT imaging of the chest was performed using the standard protocol during bolus administration of intravenous contrast. Multiplanar CT image reconstructions and MIPs were obtained to evaluate the vascular anatomy. CONTRAST:   30mL OMNIPAQUE IOHEXOL 350 MG/ML SOLN COMPARISON:  None. FINDINGS: Cardiovascular: Satisfactory opacification of the pulmonary arteries to the segmental level. No evidence of pulmonary embolism. Normal heart size. No pericardial effusion. Mediastinum/Nodes: No enlarged mediastinal, hilar, or axillary lymph nodes. Thyroid gland, trachea, and esophagus demonstrate no significant findings. Lungs/Pleura: Lungs are clear. No pleural effusion or pneumothorax. Upper  Abdomen: A 1.9 cm x 1.3 cm low-attenuation right adrenal mass is noted. Musculoskeletal: Bilateral breast implants are seen. No acute or significant osseous findings. Review of the MIP images confirms the above findings. IMPRESSION: 1. No CT evidence of pulmonary embolism or active cardiopulmonary disease. 2. Bilateral breast implants. 3. Low-attenuation right adrenal mass which may represent an adrenal adenoma. Electronically Signed   By: Virgina Norfolk M.D.   On: 05/21/2020 20:33   CT ABDOMEN PELVIS W CONTRAST  Result Date: 05/21/2020 CLINICAL DATA:  Left upper abdominal pain. EXAM: CT ABDOMEN AND PELVIS WITH CONTRAST TECHNIQUE: Multidetector CT imaging of the abdomen and pelvis was performed using the standard protocol following bolus administration of intravenous contrast. CONTRAST:  26mL OMNIPAQUE IOHEXOL 350 MG/ML SOLN COMPARISON:  January 18, 2014 FINDINGS: Lower chest: Bilateral breast implants are noted. Hepatobiliary: No focal liver abnormality is seen. No gallstones, gallbladder wall thickening, or biliary dilatation. Pancreas: Unremarkable. No pancreatic ductal dilatation or surrounding inflammatory changes. Spleen: Normal in size without focal abnormality. Adrenals/Urinary Tract: There is a 1.5 cm x 1.1 cm low-attenuation right adrenal mass. The left adrenal gland is normal in appearance. Kidneys are normal in size, without renal calculi or hydronephrosis. Bilateral subcentimeter renal cysts are seen. There is mild bilateral hydronephrosis  without evidence of obstructing renal stones. Bladder is unremarkable. Stomach/Bowel: Stomach is within normal limits. The appendix is not clearly identified. No evidence of bowel wall thickening, distention, or inflammatory changes. Vascular/Lymphatic: No significant vascular findings are present. No enlarged abdominal or pelvic lymph nodes. Reproductive: Status post hysterectomy. No adnexal masses. Other: No abdominal wall hernia or abnormality. No abdominopelvic ascites. Musculoskeletal: No acute or significant osseous findings. IMPRESSION: 1. Mild bilateral hydronephrosis without evidence of obstructing renal stones. 2. Low-attenuation right adrenal mass which may represent an adrenal adenoma. Electronically Signed   By: Virgina Norfolk M.D.   On: 05/21/2020 20:29    ____________________________________________    PROCEDURES  Procedure(s) performed:     Procedures     Medications  iohexol (OMNIPAQUE) 350 MG/ML injection 75 mL (75 mLs Intravenous Contrast Given 05/21/20 2003)     ____________________________________________   INITIAL IMPRESSION / ASSESSMENT AND PLAN / ED COURSE  Pertinent labs & imaging results that were available during my care of the patient were reviewed by me and considered in my medical decision making (see chart for details).      Assessment and plan:  Abdominal pain 34 year old female presents to the emergency department with left upper quadrant abdominal pain.  Vital signs were reassuring at triage. On physical exam, patient had left upper quadrant abdominal tenderness with no guarding.  Patient had mild leukocytosis on CBC. CMP was reassuring. Urinalysis revealed no evidence of cystitis. COVID-19 testing was negative. Lipase was within reference range.  CT abdomen and pelvis revealed a small right-sided adrenal mass but no other acute abnormality other than mild hydronephrosis bilaterally. CTA revealed no evidence of PE.  Patient has tried GI  cocktail today which has mildly improved her symptoms. I also prescribed patient Bentyl. Tylenol was recommended for discomfort. Return precautions were given to return with new or worsening symptoms. Recommended follow-up with primary care in 6 months to readdress adrenal incidentaloma identified on CT.     ____________________________________________  FINAL CLINICAL IMPRESSION(S) / ED DIAGNOSES  Final diagnoses:  Left upper quadrant abdominal pain      NEW MEDICATIONS STARTED DURING THIS VISIT:  ED Discharge Orders         Ordered    dicyclomine (  BENTYL) 10 MG capsule  4 times daily        05/21/20 2041              This chart was dictated using voice recognition software/Dragon. Despite best efforts to proofread, errors can occur which can change the meaning. Any change was purely unintentional.     Karren Cobble 05/21/20 2241    Carrie Mew, MD 05/25/20 [phone]

## 2020-05-21 NOTE — ED Triage Notes (Signed)
C/o left upper abd pain starting yesterday. Sharp pain radiating toward back increases with certain movements and palpation.

## 2020-05-24 MED FILL — DICYCLOMINE 10 MG CAPSULE: 10 | 14 days supply | Qty: 56 | Fill #0

## 2020-06-16 MED FILL — VYVANSE 30 MG CAPSULE: 30 | 30 days supply | Qty: 30 | Fill #0

## 2020-07-07 ENCOUNTER — Other Ambulatory Visit (HOSPITAL_COMMUNITY): Payer: Self-pay | Admitting: Obstetrics and Gynecology

## 2020-07-07 DIAGNOSIS — N930 Postcoital and contact bleeding: Secondary | ICD-10-CM | POA: Diagnosis not present

## 2020-07-07 DIAGNOSIS — R3915 Urgency of urination: Secondary | ICD-10-CM | POA: Diagnosis not present

## 2020-07-07 DIAGNOSIS — N898 Other specified noninflammatory disorders of vagina: Secondary | ICD-10-CM | POA: Diagnosis not present

## 2020-07-07 MED FILL — OXYBUTYNIN CL ER 5 MG TAB: 5 | 30 days supply | Qty: 30 | Fill #0

## 2020-07-26 ENCOUNTER — Other Ambulatory Visit (HOSPITAL_COMMUNITY): Payer: Self-pay | Admitting: Internal Medicine

## 2020-07-26 MED FILL — VYVANSE 30 MG CAPSULE: 30 | 30 days supply | Qty: 30 | Fill #0

## 2020-07-28 ENCOUNTER — Other Ambulatory Visit (HOSPITAL_COMMUNITY): Payer: Self-pay | Admitting: Obstetrics and Gynecology

## 2020-07-28 DIAGNOSIS — N898 Other specified noninflammatory disorders of vagina: Secondary | ICD-10-CM | POA: Diagnosis not present

## 2020-07-28 DIAGNOSIS — L239 Allergic contact dermatitis, unspecified cause: Secondary | ICD-10-CM | POA: Diagnosis not present

## 2020-07-28 MED FILL — PRAMOSONE 1-2.5 % OINT: 1-2.5 | 15 days supply | Qty: 795 | Fill #0

## 2020-07-28 MED FILL — HYDROXYZINE HCL 25 MG TABS: 25 | 20 days supply | Qty: 60 | Fill #0

## 2020-08-09 ENCOUNTER — Other Ambulatory Visit (HOSPITAL_COMMUNITY): Payer: Self-pay | Admitting: Obstetrics and Gynecology

## 2020-08-09 DIAGNOSIS — N898 Other specified noninflammatory disorders of vagina: Secondary | ICD-10-CM | POA: Diagnosis not present

## 2020-08-09 DIAGNOSIS — N909 Noninflammatory disorder of vulva and perineum, unspecified: Secondary | ICD-10-CM | POA: Diagnosis not present

## 2020-08-09 DIAGNOSIS — L292 Pruritus vulvae: Secondary | ICD-10-CM | POA: Diagnosis not present

## 2020-08-09 MED FILL — TRIAMCINOLONE 0.1% CREAM: 0.1 | 14 days supply | Qty: 45 | Fill #0

## 2020-08-15 DIAGNOSIS — F419 Anxiety disorder, unspecified: Secondary | ICD-10-CM | POA: Diagnosis not present

## 2020-08-15 DIAGNOSIS — Z79899 Other long term (current) drug therapy: Secondary | ICD-10-CM | POA: Diagnosis not present

## 2020-08-15 DIAGNOSIS — F902 Attention-deficit hyperactivity disorder, combined type: Secondary | ICD-10-CM | POA: Diagnosis not present

## 2020-08-22 DIAGNOSIS — R238 Other skin changes: Secondary | ICD-10-CM | POA: Diagnosis not present

## 2020-08-23 ENCOUNTER — Other Ambulatory Visit (HOSPITAL_COMMUNITY): Payer: Self-pay | Admitting: Internal Medicine

## 2020-08-23 MED FILL — VYVANSE 30 MG CAPSULE: 30 | 30 days supply | Qty: 30 | Fill #0

## 2020-09-14 DIAGNOSIS — N760 Acute vaginitis: Secondary | ICD-10-CM | POA: Diagnosis not present

## 2020-09-14 DIAGNOSIS — N898 Other specified noninflammatory disorders of vagina: Secondary | ICD-10-CM | POA: Diagnosis not present

## 2020-09-14 DIAGNOSIS — B373 Candidiasis of vulva and vagina: Secondary | ICD-10-CM | POA: Diagnosis not present

## 2020-09-28 ENCOUNTER — Other Ambulatory Visit (HOSPITAL_COMMUNITY): Payer: Self-pay | Admitting: Internal Medicine

## 2020-09-28 MED FILL — VYVANSE 30 MG CAPSULE: 30 | 30 days supply | Qty: 30 | Fill #0

## 2020-11-06 ENCOUNTER — Other Ambulatory Visit (HOSPITAL_COMMUNITY): Payer: Self-pay | Admitting: Internal Medicine

## 2020-11-06 MED FILL — VYVANSE 30 MG CAPSULE: 30 | 30 days supply | Qty: 30 | Fill #0

## 2020-11-14 DIAGNOSIS — F902 Attention-deficit hyperactivity disorder, combined type: Secondary | ICD-10-CM | POA: Diagnosis not present

## 2020-11-14 DIAGNOSIS — F419 Anxiety disorder, unspecified: Secondary | ICD-10-CM | POA: Diagnosis not present

## 2020-11-14 DIAGNOSIS — Z79899 Other long term (current) drug therapy: Secondary | ICD-10-CM | POA: Diagnosis not present

## 2020-11-14 DIAGNOSIS — I499 Cardiac arrhythmia, unspecified: Secondary | ICD-10-CM | POA: Diagnosis not present

## 2020-11-15 ENCOUNTER — Other Ambulatory Visit: Payer: Self-pay

## 2020-11-15 ENCOUNTER — Ambulatory Visit: Payer: 59 | Admitting: Family Medicine

## 2020-11-15 ENCOUNTER — Encounter: Payer: Self-pay | Admitting: Family Medicine

## 2020-11-15 VITALS — BP 137/90 | HR 114 | Temp 98.6°F | Ht 62.0 in | Wt 180.8 lb

## 2020-11-15 DIAGNOSIS — J358 Other chronic diseases of tonsils and adenoids: Secondary | ICD-10-CM | POA: Diagnosis not present

## 2020-11-15 DIAGNOSIS — R109 Unspecified abdominal pain: Secondary | ICD-10-CM

## 2020-11-15 DIAGNOSIS — G8929 Other chronic pain: Secondary | ICD-10-CM | POA: Diagnosis not present

## 2020-11-15 DIAGNOSIS — E278 Other specified disorders of adrenal gland: Secondary | ICD-10-CM | POA: Diagnosis not present

## 2020-11-15 LAB — URINALYSIS, COMPLETE
Bilirubin, UA: NEGATIVE
Glucose, UA: NEGATIVE
Ketones, UA: NEGATIVE
Leukocytes,UA: NEGATIVE
Nitrite, UA: NEGATIVE
Protein,UA: NEGATIVE
RBC, UA: NEGATIVE
Specific Gravity, UA: 1.02 (ref 1.005–1.030)
Urobilinogen, Ur: 0.2 mg/dL (ref 0.2–1.0)
pH, UA: 7.5 (ref 5.0–7.5)

## 2020-11-15 LAB — MICROSCOPIC EXAMINATION
RBC, Urine: NONE SEEN /hpf (ref 0–2)
WBC, UA: NONE SEEN /hpf (ref 0–5)

## 2020-11-15 NOTE — Progress Notes (Signed)
Assessment & Plan:  1. Flank pain - Urinalysis, Complete - Urine dipstick shows negative for all components.  Micro exam: few bacteria.  2. Chronic right flank pain Obtaining updated scan for endo to have when they see her. - CT Abdomen Pelvis W Contrast; Future  3. Right adrenal mass [facility]) - Ambulatory referral to Endocrinology - CT Abdomen Pelvis W Contrast; Future  4. Tonsil stone - Ambulatory referral to ENT   Follow up plan: Return if symptoms worsen or fail to improve.  Hendricks Limes, MSN, APRN, FNP-C [facility]  Subjective:   Patient ID: [name], female    DOB: 1986/06/23, 35 y.o.   MRN: [id number]  HPI: [name] is a 35 y.o. female presenting on 11/15/2020 for Flank Pain (Patient states that she has been having right sided flank pain that has been ongoing.  Follow up on Adrenal mass)  Patient reports right-sided flank pain that comes and goes.  She rates the pain 4 out of 10 when it occurs and describes it as a dull ache.  She feels the pain is getting worse over time.  It does not radiate.  Additionally she has been having headaches and palpitations later in the evening.  She sometimes feels winded and like her heart is racing.  States when she feels this way her heart rate is 110-130s. She is concerned due to a right adrenal mass that was noted on a CT scan in August 2021.  She does have a history of kidney stones as well.  Patient also reports a large tonsil stone on the left side.   ROS: Negative unless specifically indicated above in HPI.   Relevant past medical history reviewed and updated as indicated.   Allergies and medications reviewed and updated.   Current Outpatient Medications:  .  albuterol (VENTOLIN HFA) 108 (90 Base) MCG/ACT inhaler, USE 1 - 2 PUFFS EVERY 6 HOURS AS NEEDED (Needs to be seen before next refill), Disp: 8.5 g, Rfl: 0 .  cetirizine (ZYRTEC) 10 MG tablet, Take 10 mg by mouth 2 (two) times daily. , Disp:  , Rfl:  .  clobetasol cream (TEMOVATE) 4.33 %, Apply 1 application topically 2 (two) times daily. (Patient taking differently: Apply 1 application topically 2 (two) times daily as needed (irritation).), Disp: 60 g, Rfl: 3 .  clobetasol ointment (TEMOVATE) 2.95 %, Apply 1 application topically 2 (two) times daily as needed (irritation)., Disp: , Rfl:  .  docusate sodium (COLACE) 100 MG capsule, Take 1 capsule (100 mg total) by mouth 2 (two) times daily., Disp: 100 capsule, Rfl: 1 .  HYDROcodone-acetaminophen (NORCO/VICODIN) 5-325 MG tablet, Take 1 tablet by mouth every 4 (four) hours as needed for moderate pain., Disp: 30 tablet, Rfl: 0 .  ibuprofen (ADVIL) 800 MG tablet, Take 1 tablet (800 mg total) by mouth every 8 (eight) hours as needed for moderate pain (mild pain)., Disp: 45 tablet, Rfl: 1 .  ondansetron (ZOFRAN-ODT) 4 MG disintegrating tablet, DISSOLVE 1 TABLET BY MOUTH EVERY 8 HOURS AS NEEDED FOR NAUSEA OR VOMITING, Disp: 20 tablet, Rfl: 0 .  scopolamine (TRANSDERM-SCOP, 1.5 MG,) 1 MG/3DAYS, Place 1 patch (1.5 mg total) onto the skin every 3 (three) days., Disp: 10 patch, Rfl: 1 .  VYVANSE 10 MG capsule, Take 10 mg by mouth daily as needed (extra focus). , Disp: , Rfl:  .  VYVANSE 30 MG capsule, Take 30 mg by mouth daily. , Disp: , Rfl: 0 .  dicyclomine (BENTYL) 10 MG  capsule, Take 1 capsule (10 mg total) by mouth 4 (four) times daily for 14 days., Disp: 56 capsule, Rfl: 0  Allergies  Allergen Reactions  . Prozac [Fluoxetine Hcl] Nausea Only  . Sumatriptan   . Latex Rash    Objective:   BP 137/90   Pulse (!) 114   Temp 98.6 F (37 C) (Temporal)   Ht 5\' 2"  (1.575 m)   Wt 180 lb 12.8 oz (82 kg)   SpO2 98%   BMI 33.07 kg/m    Physical Exam Vitals reviewed.  Constitutional:      General: She is not in acute distress.    Appearance: Normal appearance. She is not ill-appearing, toxic-appearing or diaphoretic.  HENT:     Head: Normocephalic and atraumatic.     Mouth/Throat:      Comments: Encapsulated tonsil stone of left tonsil. Eyes:     General: No scleral icterus.       Right eye: No discharge.        Left eye: No discharge.     Conjunctiva/sclera: Conjunctivae normal.  Cardiovascular:     Rate and Rhythm: Normal rate.  Pulmonary:     Effort: Pulmonary effort is normal. No respiratory distress.  Abdominal:     Tenderness: There is no right CVA tenderness or left CVA tenderness.  Musculoskeletal:        General: Normal range of motion.     Cervical back: Normal range of motion.  Skin:    General: Skin is warm and dry.     Capillary Refill: Capillary refill takes less than 2 seconds.  Neurological:     General: No focal deficit present.     Mental Status: She is alert and oriented to person, place, and time. Mental status is at baseline.  Psychiatric:        Mood and Affect: Mood normal.        Behavior: Behavior normal.        Thought Content: Thought content normal.        Judgment: Judgment normal.

## 2020-11-15 NOTE — Patient Instructions (Signed)
I am referring to endocrinology and ENT. They will contact you.

## 2020-11-20 DIAGNOSIS — E278 Other specified disorders of adrenal gland: Secondary | ICD-10-CM | POA: Insufficient documentation

## 2020-11-24 ENCOUNTER — Other Ambulatory Visit: Payer: Self-pay

## 2020-11-24 ENCOUNTER — Encounter: Payer: Self-pay | Admitting: "Endocrinology

## 2020-11-24 ENCOUNTER — Ambulatory Visit: Payer: 59 | Admitting: "Endocrinology

## 2020-11-24 VITALS — BP 124/76 | HR 68 | Ht 62.0 in | Wt 177.4 lb

## 2020-11-24 DIAGNOSIS — D3501 Benign neoplasm of right adrenal gland: Secondary | ICD-10-CM | POA: Diagnosis not present

## 2020-11-24 NOTE — Progress Notes (Signed)
Endocrinology Consult Note                                            11/24/2020, 11:21 AM   Subjective:    Patient ID: [name], female    DOB: [birth year], PCP Sharion Balloon, FNP   Past Medical History:  Diagnosis Date  . Anxiety   . Asthma   . Concussion   . Depression   . Dysrhythmia   . Family history of adverse reaction to anesthesia   . GERD (gastroesophageal reflux disease)   . Headache(784.0)    Migraines  . Herpes   . History of kidney stones   . Keratosis   . PONV (postoperative nausea and vomiting)    prolonged sedation  . Postural orthostatic tachycardia syndrome   . Tingling in extremities    occ   Past Surgical History:  Procedure Laterality Date  . BLADDER SUSPENSION N/A 05/01/2016   Procedure: TRANSVAGINAL TAPE (TVT) PROCEDURE;  Surgeon: Cheri Fowler, MD;  Location: [facility];  Service: Gynecology;  Laterality: N/A;  . BREAST ENHANCEMENT SURGERY    . CYSTOCELE REPAIR N/A 05/01/2016   Procedure: ANTERIOR REPAIR (CYSTOCELE);  Surgeon: Cheri Fowler, MD;  Location: [facility];  Service: Gynecology;  Laterality: N/A;  . CYSTOSCOPY N/A 02/24/2020   Procedure: POSSIBLE CYSTOSCOPY;  Surgeon: Janyth Contes, MD;  Location: [facility];  Service: Gynecology;  Laterality: N/A;  possible  . LAPAROSCOPIC VAGINAL HYSTERECTOMY WITH SALPINGECTOMY Bilateral 02/24/2020   Procedure: LAPAROSCOPIC ASSISTED VAGINAL HYSTERECTOMY WITH SALPINGECTOMY;  Surgeon: Janyth Contes, MD;  Location: [facility];  Service: Gynecology;  Laterality: Bilateral;  . RECTOCELE REPAIR N/A 02/24/2020   Procedure: POSTERIOR REPAIR (RECTOCELE) AND POSSIBLE ANTERIOR REPAIR;  Surgeon: Janyth Contes, MD;  Location: [facility];  Service: Gynecology;  Laterality: N/A;   Social History   Socioeconomic History  . Marital status: Married    Spouse name: Jeneen Rinks  . Number of children: Not on file  . Years of education: 3  . Highest  education level: Not on file  Occupational History  . Not on file  Tobacco Use  . Smoking status: Never Smoker  . Smokeless tobacco: Never Used  Vaping Use  . Vaping Use: Never used  Substance and Sexual Activity  . Alcohol use: Yes    Comment: occas  . Drug use: No  . Sexual activity: Yes    Birth control/protection: I.U.D.  Other Topics Concern  . Not on file  Social History Narrative   Married, 2 children.    Social Determinants of Health   Financial Resource Strain: Not on file  Food Insecurity: Not on file  Transportation Needs: Not on file  Physical Activity: Not on file  Stress: Not on file  Social Connections: Not on file   Family History  Problem Relation Age of Onset  . Alcohol abuse Father   . Diabetes Father   . Arthritis Maternal Aunt   . Arthritis Maternal Grandmother   . COPD Maternal Grandmother   . Thyroid disease Maternal Grandmother   . Alcohol abuse Maternal Grandfather   . Cancer Maternal Grandfather        liver, pancreatic  . Cancer Paternal Grandmother        brain  . Alcohol abuse Paternal Grandfather   . Stroke Paternal Grandfather   .  Heart disease Neg Hx        early heart disease  . Sudden death Neg Hx        cardiac  . Cardiomyopathy Neg Hx   . Rectal cancer Neg Hx   . Stomach cancer Neg Hx   . Esophageal cancer Neg Hx   . Colon cancer Neg Hx    Outpatient Encounter Medications as of 11/24/2020  Medication Sig  . albuterol (VENTOLIN HFA) 108 (90 Base) MCG/ACT inhaler USE 1 - 2 PUFFS EVERY 6 HOURS AS NEEDED (Needs to be seen before next refill)  . cetirizine (ZYRTEC) 10 MG tablet Take 10 mg by mouth 2 (two) times daily.   . clobetasol cream (TEMOVATE) 8.11 % Apply 1 application topically 2 (two) times daily. (Patient taking differently: Apply 1 application topically 2 (two) times daily as needed (irritation).)  . clobetasol ointment (TEMOVATE) 9.14 % Apply 1 application topically 2 (two) times daily as needed (irritation).  .  ondansetron (ZOFRAN-ODT) 4 MG disintegrating tablet DISSOLVE 1 TABLET BY MOUTH EVERY 8 HOURS AS NEEDED FOR NAUSEA OR VOMITING  . scopolamine (TRANSDERM-SCOP, 1.5 MG,) 1 MG/3DAYS Place 1 patch (1.5 mg total) onto the skin every 3 (three) days. (Patient taking differently: Place 1 patch onto the skin every 3 (three) days. As needed)  . VYVANSE 10 MG capsule Take 10 mg by mouth daily as needed (extra focus).   Marland Kitchen VYVANSE 30 MG capsule Take 30 mg by mouth daily.   . [DISCONTINUED] dicyclomine (BENTYL) 10 MG capsule Take 1 capsule (10 mg total) by mouth 4 (four) times daily for 14 days.  . [DISCONTINUED] docusate sodium (COLACE) 100 MG capsule Take 1 capsule (100 mg total) by mouth 2 (two) times daily.  . [DISCONTINUED] HYDROcodone-acetaminophen (NORCO/VICODIN) 5-325 MG tablet Take 1 tablet by mouth every 4 (four) hours as needed for moderate pain.  . [DISCONTINUED] ibuprofen (ADVIL) 800 MG tablet Take 1 tablet (800 mg total) by mouth every 8 (eight) hours as needed for moderate pain (mild pain).   No facility-administered encounter medications on file as of 11/24/2020.   ALLERGIES: Allergies  Allergen Reactions  . Prozac [Fluoxetine Hcl] Nausea Only  . Latex Rash    VACCINATION STATUS: Immunization History  Administered Date(s) Administered  . DTaP 01/25/1988, 10/31/1988, 12/30/1991  . Hepatitis B 06/14/1998, 08/02/1998, 12/13/1998  . HiB (PRP-OMP) 01/01/1990  . IPV 10/31/1988, 12/30/1991  . Influenza,inj,Quad PF,6+ Mos 07/13/2015  . Influenza-Unspecified 07/19/2016  . MMR 03/02/2015, 05/16/2015  . PPD Test 02/14/2015, 02/28/2015, 03/01/2016  . Tdap 12/13/2011    HPI [name] is 35 y.o. female who presents today with a medical history as above. she is being seen in consultation for right adrenal incidentaloma requested by Sharion Balloon, FNP.  History is obtained directly from the patient as well as chart review.  She denies any prior history of adrenal,  Thyroid, pituitary  dysfunctions. In August 2021, while undergoing work-up for left abdominal pain, CT scan incidentally discovered 1.5 cm low-attenuation right adrenal mass described as adenoma.  Prior abdominal CT from  2015 did not show this mass lesion. -Reportedly, she is already scheduled for repeat surveillance CT scan of abdomen on December 04, 2020 . She did not undergo any endocrine work-up as of yet.  She denies history of hypertension, however reveals some paroxysmal palpitations with elevated blood pressure at times. She denies hirsutism.  She has minimally fluctuating body weight. -She denies rapid weight changes.  She is a mother of 3 children status  post bilateral ovary sparing hysterectomy in 2021. -She denies family history of endocrinopathies.  She is a non-smoker, uses alcohol only socially.  Review of Systems  Constitutional: +no recent major weight changes,  + intermittent subjective hyperthermia, no subjective hypothermia Eyes: no blurry vision, no xerophthalmia ENT: no sore throat, no nodules palpated in throat, no dysphagia/odynophagia, no hoarseness Cardiovascular: no Chest Pain, no Shortness of Breath, no palpitations, no leg swelling Respiratory: no cough, no shortness of breath Gastrointestinal: no Nausea/Vomiting/Diarhhea Musculoskeletal: no muscle/joint aches Skin: no rashes Neurological: no tremors, no numbness, no tingling, no dizziness Psychiatric: no depression, no anxiety  Objective:    Vitals with BMI 11/24/2020 11/15/2020 05/21/2020  Height 5' 2"  5' 2"  -  Weight 177 lbs 6 oz 180 lbs 13 oz -  BMI 29.47 65.46 -  Systolic 503 546 568  Diastolic 76 90 99  Pulse 68 114 84    BP 124/76   Pulse 68   Ht 5' 2"  (1.575 m)   Wt 177 lb 6.4 oz (80.5 kg)   BMI 32.45 kg/m   Wt Readings from Last 3 Encounters:  11/24/20 177 lb 6.4 oz (80.5 kg)  11/15/20 180 lb 12.8 oz (82 kg)  05/21/20 175 lb (79.4 kg)    Physical Exam  Constitutional:  Body mass index is 32.45 kg/m.,  not in  acute distress, normal state of mind Eyes: PERRLA, EOMI, no exophthalmos ENT: moist mucous membranes, no gross thyromegaly, no gross cervical lymphadenopathy Cardiovascular: normal precordial activity, Regular Rate and Rhythm, no Murmur/Rubs/Gallops Respiratory:  adequate breathing efforts, no gross chest deformity, Clear to auscultation bilaterally Gastrointestinal: No flank tenderness Musculoskeletal: no gross deformities, strength intact in all four extremities Skin: moist, warm, no rashes, no abnormal hair growth. Neurological: no tremor with outstretched hands, Deep tendon reflexes normal in bilateral lower extremities.  CMP ( most recent) CMP     Component Value Date/Time   NA 139 05/21/2020 1905   NA 138 11/22/2019 1529   K 4.0 05/21/2020 1905   CL 103 05/21/2020 1905   CO2 28 05/21/2020 1905   GLUCOSE 95 05/21/2020 1905   BUN 13 05/21/2020 1905   BUN 7 11/22/2019 1529   CREATININE 0.86 05/21/2020 1905   CALCIUM 9.2 05/21/2020 1905   PROT 7.6 05/21/2020 1905   PROT 6.5 11/22/2019 1529   ALBUMIN 4.6 05/21/2020 1905   ALBUMIN 4.2 11/22/2019 1529   AST 19 05/21/2020 1905   ALT 22 05/21/2020 1905   ALKPHOS 60 05/21/2020 1905   BILITOT 0.8 05/21/2020 1905   BILITOT 0.3 11/22/2019 1529   GFRNONAA >60 05/21/2020 1905   GFRAA >60 05/21/2020 1905    Lipid Panel ( most recent) Lipid Panel     Component Value Date/Time   CHOL 136 06/21/2019 1527   TRIG 72 06/21/2019 1527   HDL 51 06/21/2019 1527   CHOLHDL 2.7 06/21/2019 1527   LDLCALC 71 06/21/2019 1527   LABVLDL 14 06/21/2019 1527      Lab Results  Component Value Date   TSH 1.560 06/21/2019   TSH 1.600 07/19/2015      Assessment & Plan:   1. Adrenal adenoma, right - [name]  is being seen at a kind request of Hawks, Theador Hawthorne, FNP. - I have reviewed her available endocrine records and clinically evaluated the patient. - Based on these reviews, she has right adrenal incidentaloma measuring 1.5 cm, low  attenuating described as possible adenoma However,  there is not sufficient information to proceed with definitive  treatment plan. -She will benefit from functional assessment of this adrenal lesion.  Accordingly, she will be sent to lab for 24-hour urine collection for measurement of aldosterone, free cortisol, metanephrines, and catecholamines. -Her pretest probability for pheochromocytoma as well as for endogenous glucocorticoid excess is very low -She is also interested to know if it is active in androgen secretion.  She will have serum  DHEA-sulfate measured. -Since she is already scheduled for repeat CT scan in March, she will be seen after her imaging and labs to discuss the results.  Subsequent measures will depend on the findings on the studies.  -Her blood pressure is controlled at 124/76.  I did not initiate any new prescriptions today. - she is advised to maintain close follow up with Sharion Balloon, FNP for primary care needs.   - Time spent with the patient: 60 minutes, of which >50% was spent in  counseling her about her right adrenal incidentaloma and the rest in obtaining information about her symptoms, reviewing her previous labs/studies ( including abstractions from other facilities),  evaluations, and treatments,  and developing a plan to confirm diagnosis and long term treatment based on the latest standards of care/guidelines; and documenting her care.  [name] participated in the discussions, expressed understanding, and voiced agreement with the above plans.  All questions were answered to her satisfaction. she is encouraged to contact clinic should she have any questions or concerns prior to her return visit.  Follow up plan: Return in about 2 weeks (around 12/08/2020), or and other 24 hour urine studies, for [place], MD [facility] [address] [place], [place]  [address] Phone: [phone]  Fax: [phone]     11/24/2020, 11:21 AM  This note was partially dictated with voice recognition software. Similar sounding words can be transcribed inadequately or may not  be corrected upon review.

## 2020-11-29 DIAGNOSIS — D3501 Benign neoplasm of right adrenal gland: Secondary | ICD-10-CM | POA: Diagnosis not present

## 2020-11-30 DIAGNOSIS — D3501 Benign neoplasm of right adrenal gland: Secondary | ICD-10-CM | POA: Diagnosis not present

## 2020-11-30 LAB — DHEA-SULFATE: DHEA-SO4: 278 ug/dL (ref 84.8–378.0)

## 2020-11-30 LAB — CREATININE, URINE, 24 HOUR
Creatinine, 24H Ur: 1670 mg/24 hr (ref 800–1800)
Creatinine, Urine: 75.9 mg/dL

## 2020-12-04 ENCOUNTER — Other Ambulatory Visit: Payer: Self-pay

## 2020-12-04 ENCOUNTER — Ambulatory Visit
Admission: RE | Admit: 2020-12-04 | Discharge: 2020-12-04 | Disposition: A | Discharge status: Home / Self Care | Payer: 59 | Source: Ambulatory Visit | Attending: Family Medicine | Admitting: Family Medicine

## 2020-12-04 DIAGNOSIS — K760 Fatty (change of) liver, not elsewhere classified: Secondary | ICD-10-CM | POA: Diagnosis not present

## 2020-12-04 DIAGNOSIS — N132 Hydronephrosis with renal and ureteral calculous obstruction: Secondary | ICD-10-CM | POA: Diagnosis not present

## 2020-12-04 DIAGNOSIS — N281 Cyst of kidney, acquired: Secondary | ICD-10-CM | POA: Diagnosis not present

## 2020-12-04 DIAGNOSIS — G8929 Other chronic pain: Secondary | ICD-10-CM

## 2020-12-04 DIAGNOSIS — E278 Other specified disorders of adrenal gland: Secondary | ICD-10-CM

## 2020-12-04 MED ORDER — IOPAMIDOL (ISOVUE-300) INJECTION 61%
100.0000 mL | Freq: Once | INTRAVENOUS | Status: AC | PRN
  Administered 2020-12-04: 100 mL via INTRAVENOUS

## 2020-12-05 LAB — CATECHOLAMINES, FRACTIONATED, URINE, 24 HOUR
Dopamine , 24H Ur: 250 ug/24 hr (ref 0–510)
Dopamine, Rand Ur: 96 ug/L
Epinephrine, 24H Ur: 5 ug/24 hr (ref 0–20)
Epinephrine, Rand Ur: 2 ug/L
Norepinephrine, 24H Ur: 39 ug/24 hr (ref 0–135)
Norepinephrine, Rand Ur: 15 ug/L

## 2020-12-05 LAB — METANEPHRINES, URINE, 24 HOUR
Metaneph Total, Ur: 58 ug/L
Metanephrines, 24H Ur: 128 ug/24 hr (ref 36–209)
Normetanephrine, 24H Ur: 271 ug/24 hr (ref 131–612)
Normetanephrine, Ur: 123 ug/L

## 2020-12-06 ENCOUNTER — Other Ambulatory Visit: Payer: Self-pay | Admitting: "Endocrinology

## 2020-12-06 ENCOUNTER — Telehealth: Payer: Self-pay

## 2020-12-06 DIAGNOSIS — D3501 Benign neoplasm of right adrenal gland: Secondary | ICD-10-CM

## 2020-12-06 LAB — ALDOSTERONE, URINE
Aldosterone U,Random: <2.5 ug/L
Aldosterone, 24H Ur: <5.5 ug/24 hr (ref 0.00–19.00)

## 2020-12-06 NOTE — Telephone Encounter (Signed)
Can you call this pt? Dr Dorris Fetch said not all of her labs were collected and she needs to go back to the lab and have them done and move her appt. She is on sch for Friday.

## 2020-12-06 NOTE — Telephone Encounter (Signed)
Done

## 2020-12-07 ENCOUNTER — Ambulatory Visit: Payer: 59 | Admitting: "Endocrinology

## 2020-12-08 ENCOUNTER — Ambulatory Visit: Payer: 59 | Admitting: "Endocrinology

## 2020-12-11 DIAGNOSIS — D3501 Benign neoplasm of right adrenal gland: Secondary | ICD-10-CM | POA: Diagnosis not present

## 2020-12-11 NOTE — Telephone Encounter (Signed)
Can you cancel the Aldosterone and Creatine urine orders , she only needs the Cortisol.

## 2020-12-11 NOTE — Telephone Encounter (Signed)
Pt is calling in regards to her urine test, she states the lab was very confused about what urine needed to be ran and what did not, requesting to speak to nurse

## 2020-12-13 ENCOUNTER — Other Ambulatory Visit (HOSPITAL_COMMUNITY): Payer: Self-pay | Admitting: Internal Medicine

## 2020-12-13 NOTE — Telephone Encounter (Signed)
They are still pending, I am going to to keep checking on them

## 2020-12-13 NOTE — Telephone Encounter (Signed)
This pt has an appt tomorrow, appt notes still say no urine, can you follow up with the lab on this

## 2020-12-14 ENCOUNTER — Ambulatory Visit: Payer: 59 | Admitting: "Endocrinology

## 2020-12-14 DIAGNOSIS — J353 Hypertrophy of tonsils with hypertrophy of adenoids: Secondary | ICD-10-CM | POA: Diagnosis not present

## 2020-12-14 DIAGNOSIS — J3503 Chronic tonsillitis and adenoiditis: Secondary | ICD-10-CM | POA: Diagnosis not present

## 2020-12-20 ENCOUNTER — Other Ambulatory Visit (HOSPITAL_BASED_OUTPATIENT_CLINIC_OR_DEPARTMENT_OTHER): Payer: Self-pay

## 2020-12-22 NOTE — Telephone Encounter (Signed)
Pt is calling back in regards to this. She states it has been 10 days.

## 2020-12-22 NOTE — Telephone Encounter (Signed)
Left VM for pt letting her know that [facility] will be calling us back with more information on the Cortisol 24hr urine results. They received it on the 15th and test is still showing pending.

## 2020-12-24 LAB — CORTISOL, URINE, FREE
Cortisol (Ur), Free: 60 ug/24 hr — ABNORMAL HIGH (ref 6–42)
Cortisol,F,ug/L,U: 20 ug/L

## 2020-12-27 ENCOUNTER — Other Ambulatory Visit: Payer: Self-pay | Admitting: Family Medicine

## 2020-12-27 ENCOUNTER — Telehealth (INDEPENDENT_AMBULATORY_CARE_PROVIDER_SITE_OTHER): Payer: 59 | Admitting: Family Medicine

## 2020-12-27 ENCOUNTER — Encounter: Payer: Self-pay | Admitting: Family Medicine

## 2020-12-27 DIAGNOSIS — N1330 Unspecified hydronephrosis: Secondary | ICD-10-CM | POA: Diagnosis not present

## 2020-12-27 DIAGNOSIS — M791 Myalgia, unspecified site: Secondary | ICD-10-CM | POA: Diagnosis not present

## 2020-12-27 DIAGNOSIS — D3501 Benign neoplasm of right adrenal gland: Secondary | ICD-10-CM

## 2020-12-27 DIAGNOSIS — N2889 Other specified disorders of kidney and ureter: Secondary | ICD-10-CM

## 2020-12-27 DIAGNOSIS — R601 Generalized edema: Secondary | ICD-10-CM

## 2020-12-27 MED ORDER — METHOCARBAMOL 500 MG PO TABS: 500.0000 mg | ORAL_TABLET | Freq: Three times a day (TID) | ORAL | 2 refills | Status: DC | PRN

## 2020-12-27 MED ORDER — FUROSEMIDE 20 MG PO TABS: 20.0000 mg | ORAL_TABLET | Freq: Every day | ORAL | 0 refills | Status: DC

## 2020-12-27 MED FILL — FUROSEMIDE 20 MG TABS: 20 | 3 days supply | Qty: 3 | Fill #0

## 2020-12-27 MED FILL — METHOCARBAMOL 500 MG TABS: 500 | 10 days supply | Qty: 30 | Fill #0

## 2020-12-27 NOTE — Progress Notes (Signed)
Virtual Visit via Video note  I connected with [name] on 12/27/20 at 3:36 PM by video and verified that I am speaking with the correct person using two identifiers. [name] is currently located at in her vehicle and nobody is currently with her during visit. The provider, Caitlin Brooklyn, FNP is located in their home at time of visit.  I discussed the limitations, risks, security and privacy concerns of performing an evaluation and management service by video and the availability of in person appointments. I also discussed with the patient that there may be a patient responsible charge related to this service. The patient expressed understanding and agreed to proceed.  Subjective: PCP: Caitlin Balloon, FNP  Chief Complaint  Patient presents with  . Discuss CT   At our last visit patient was referred to endocrinology due to a right adrenal mass.  She has seen [name] for the initial consult at which time additional lab work and a 24-hour urine collection was ordered.  She has not yet had her follow-up to discuss the results.  She did have a repeat abdomen pelvis CT scan with contrast earlier this month.  Patient reports after the CT scan she developed 3+ pitting edema to both lower extremities, although she also had some edema in her arms as well.  She has been experiencing muscle pains since the CT as well.  Her swelling is improving, but has not yet resolved.  Patient is very concerned about the bilateral cortical scarring of her kidneys as well as bilateral hydronephrosis which has not resolved.  The right adrenal mass was stable in appearance, favoring a benign adenoma.   ROS: Per HPI  Current Outpatient Medications:  .  albuterol (VENTOLIN HFA) 108 (90 Base) MCG/ACT inhaler, USE 1 - 2 PUFFS EVERY 6 HOURS AS NEEDED (Needs to be seen before next refill), Disp: 8.5 g, Rfl: 0 .  cetirizine (ZYRTEC) 10 MG tablet, Take 10 mg by mouth 2 (two) times daily. , Disp: , Rfl:  .   clobetasol cream (TEMOVATE) 0.27 %, Apply 1 application topically 2 (two) times daily. (Patient taking differently: Apply 1 application topically 2 (two) times daily as needed (irritation).), Disp: 60 g, Rfl: 3 .  clobetasol ointment (TEMOVATE) 7.41 %, Apply 1 application topically 2 (two) times daily as needed (irritation)., Disp: , Rfl:  .  ondansetron (ZOFRAN-ODT) 4 MG disintegrating tablet, DISSOLVE 1 TABLET BY MOUTH EVERY 8 HOURS AS NEEDED FOR NAUSEA OR VOMITING, Disp: 20 tablet, Rfl: 0 .  scopolamine (TRANSDERM-SCOP, 1.5 MG,) 1 MG/3DAYS, Place 1 patch (1.5 mg total) onto the skin every 3 (three) days. (Patient taking differently: Place 1 patch onto the skin every 3 (three) days. As needed), Disp: 10 patch, Rfl: 1 .  VYVANSE 10 MG capsule, Take 10 mg by mouth daily as needed (extra focus). , Disp: , Rfl:  .  VYVANSE 30 MG capsule, Take 30 mg by mouth daily. , Disp: , Rfl: 0  Allergies  Allergen Reactions  . Prozac [Fluoxetine Hcl] Nausea Only  . Latex Rash   Past Medical History:  Diagnosis Date  . Anxiety   . Asthma   . Concussion   . Depression   . Dysrhythmia   . Family history of adverse reaction to anesthesia   . GERD (gastroesophageal reflux disease)   . Headache(784.0)    Migraines  . Herpes   . History of kidney stones   . Keratosis   . PONV (postoperative nausea and  vomiting)    prolonged sedation  . Postural orthostatic tachycardia syndrome   . Tingling in extremities    occ    Observations/Objective: Physical Exam Constitutional:      General: She is not in acute distress.    Appearance: Normal appearance. She is not ill-appearing or toxic-appearing.  Eyes:     General: No scleral icterus.       Right eye: No discharge.        Left eye: No discharge.     Conjunctiva/sclera: Conjunctivae normal.  Pulmonary:     Effort: Pulmonary effort is normal. No respiratory distress.  Neurological:     Mental Status: She is alert and oriented to person, place, and  time.  Psychiatric:        Mood and Affect: Mood normal.        Behavior: Behavior normal.        Thought Content: Thought content normal.        Judgment: Judgment normal.    Assessment and Plan: 1. Bilateral hydronephrosis - Ambulatory referral to Urology - CBC with Differential/Platelet; Future - Renal Function Panel; Future  2. Kidney scarring - Ambulatory referral to Urology - CBC with Differential/Platelet; Future - Renal Function Panel; Future  3. Generalized edema Rx'd furosemide 20 mg PO x3 days.  - CBC with Differential/Platelet; Future - Renal Function Panel; Future  4. Muscle pain Rx'd Robaxin 500 mg TID PRN.  5. Adrenal adenoma, right Follow-up with endocrinologist as scheduled.    Follow Up Instructions:  I discussed the assessment and treatment plan with the patient. The patient was provided an opportunity to ask questions and all were answered. The patient agreed with the plan and demonstrated an understanding of the instructions.   The patient was advised to call back or seek an in-person evaluation if the symptoms worsen or if the condition fails to improve as anticipated.  The above assessment and management plan was discussed with the patient. The patient verbalized understanding of and has agreed to the management plan. Patient is aware to call the clinic if symptoms persist or worsen. Patient is aware when to return to the clinic for a follow-up visit. Patient educated on when it is appropriate to go to the emergency department.   Time call ended: 4:20 PM  I provided 44 minutes of face-to-face time during this encounter.   Hendricks Limes, MSN, APRN, FNP-C [facility] 12/27/20

## 2020-12-28 LAB — CBC WITH DIFFERENTIAL/PLATELET
Basophils Absolute: 0.1 10*3/uL (ref 0.0–0.2)
Basos: 1 %
EOS (ABSOLUTE): 0.2 10*3/uL (ref 0.0–0.4)
Eos: 1 %
Hematocrit: 44.8 % (ref 34.0–46.6)
Hemoglobin: 15.4 g/dL (ref 11.1–15.9)
Immature Grans (Abs): 0 10*3/uL (ref 0.0–0.1)
Immature Granulocytes: 0 %
Lymphocytes Absolute: 2.5 10*3/uL (ref 0.7–3.1)
Lymphs: 18 %
MCH: 30 pg (ref 26.6–33.0)
MCHC: 34.4 g/dL (ref 31.5–35.7)
MCV: 87 fL (ref 79–97)
Monocytes Absolute: 0.9 10*3/uL (ref 0.1–0.9)
Monocytes: 6 %
Neutrophils Absolute: 9.9 10*3/uL — ABNORMAL HIGH (ref 1.4–7.0)
Neutrophils: 74 %
Platelets: 324 10*3/uL (ref 150–450)
RBC: 5.13 x10E6/uL (ref 3.77–5.28)
RDW: 12.4 % (ref 11.7–15.4)
WBC: 13.6 10*3/uL — ABNORMAL HIGH (ref 3.4–10.8)

## 2020-12-28 LAB — RENAL FUNCTION PANEL
Albumin: 4.6 g/dL (ref 3.8–4.8)
BUN/Creatinine Ratio: 11 (ref 9–23)
BUN: 10 mg/dL (ref 6–20)
CO2: 23 mmol/L (ref 20–29)
Calcium: 9 mg/dL (ref 8.7–10.2)
Chloride: 103 mmol/L (ref 96–106)
Creatinine, Ser: 0.87 mg/dL (ref 0.57–1.00)
Glucose: 126 mg/dL — ABNORMAL HIGH (ref 65–99)
Phosphorus: 2.9 mg/dL — ABNORMAL LOW (ref 3.0–4.3)
Potassium: 4 mmol/L (ref 3.5–5.2)
Sodium: 140 mmol/L (ref 134–144)
eGFR: 90 mL/min/{1.73_m2} (ref >59)

## 2020-12-29 ENCOUNTER — Ambulatory Visit: Payer: 59 | Admitting: "Endocrinology

## 2020-12-29 ENCOUNTER — Encounter: Payer: Self-pay | Admitting: "Endocrinology

## 2020-12-29 ENCOUNTER — Other Ambulatory Visit: Payer: Self-pay

## 2020-12-29 VITALS — BP 118/86 | HR 76 | Ht 62.0 in | Wt 179.0 lb

## 2020-12-29 DIAGNOSIS — R739 Hyperglycemia, unspecified: Secondary | ICD-10-CM | POA: Diagnosis not present

## 2020-12-29 DIAGNOSIS — D3501 Benign neoplasm of right adrenal gland: Secondary | ICD-10-CM | POA: Diagnosis not present

## 2020-12-29 NOTE — Progress Notes (Signed)
12/29/2020, 11:54 AM   Endocrinology follow-up note  Subjective:    Patient ID: [name], female    DOB: 1986-02-06, PCP Caitlin Balloon, FNP   Past Medical History:  Diagnosis Date  . Anxiety   . Asthma   . Concussion   . Depression   . Dysrhythmia   . Family history of adverse reaction to anesthesia   . GERD (gastroesophageal reflux disease)   . Headache(784.0)    Migraines  . Herpes   . History of kidney stones   . Keratosis   . PONV (postoperative nausea and vomiting)    prolonged sedation  . Postural orthostatic tachycardia syndrome   . Tingling in extremities    occ   Past Surgical History:  Procedure Laterality Date  . BLADDER SUSPENSION N/A 05/01/2016   Procedure: TRANSVAGINAL TAPE (TVT) PROCEDURE;  Surgeon: Caitlin Fowler, MD;  Location: [facility];  Service: Gynecology;  Laterality: N/A;  . BREAST ENHANCEMENT SURGERY    . CYSTOCELE REPAIR N/A 05/01/2016   Procedure: ANTERIOR REPAIR (CYSTOCELE);  Surgeon: Caitlin Fowler, MD;  Location: [facility];  Service: Gynecology;  Laterality: N/A;  . CYSTOSCOPY N/A 02/24/2020   Procedure: POSSIBLE CYSTOSCOPY;  Surgeon: Caitlin Contes, MD;  Location: [facility];  Service: Gynecology;  Laterality: N/A;  possible  . LAPAROSCOPIC VAGINAL HYSTERECTOMY WITH SALPINGECTOMY Bilateral 02/24/2020   Procedure: LAPAROSCOPIC ASSISTED VAGINAL HYSTERECTOMY WITH SALPINGECTOMY;  Surgeon: Caitlin Contes, MD;  Location: [facility];  Service: Gynecology;  Laterality: Bilateral;  . RECTOCELE REPAIR N/A 02/24/2020   Procedure: POSTERIOR REPAIR (RECTOCELE) AND POSSIBLE ANTERIOR REPAIR;  Surgeon: Caitlin Contes, MD;  Location: [facility];  Service: Gynecology;  Laterality: N/A;   Social History   Socioeconomic History  . Marital status: Married    Spouse name: [name]  . Number of children: Not on file  . Years of education: 3  .  Highest education level: Not on file  Occupational History  . Not on file  Tobacco Use  . Smoking status: Never Smoker  . Smokeless tobacco: Never Used  Vaping Use  . Vaping Use: Never used  Substance and Sexual Activity  . Alcohol use: Yes    Comment: occas  . Drug use: No  . Sexual activity: Yes    Birth control/protection: I.U.D.  Other Topics Concern  . Not on file  Social History Narrative   Married, 2 children.    Social Determinants of Health   Financial Resource Strain: Not on file  Food Insecurity: Not on file  Transportation Needs: Not on file  Physical Activity: Not on file  Stress: Not on file  Social Connections: Not on file   Family History  Problem Relation Age of Onset  . Alcohol abuse Father   . Diabetes Father   . Arthritis Maternal Aunt   . Arthritis Maternal Grandmother   . COPD Maternal Grandmother   . Thyroid disease Maternal Grandmother   . Alcohol abuse Maternal Grandfather   . Cancer Maternal Grandfather        liver, pancreatic  . Cancer Paternal Grandmother        brain  . Alcohol abuse Paternal Grandfather   . Stroke Paternal Grandfather   .  Heart disease Neg Hx        early heart disease  . Sudden death Neg Hx        cardiac  . Cardiomyopathy Neg Hx   . Rectal cancer Neg Hx   . Stomach cancer Neg Hx   . Esophageal cancer Neg Hx   . Colon cancer Neg Hx    Outpatient Encounter Medications as of 12/29/2020  Medication Sig  . albuterol (VENTOLIN HFA) 108 (90 Base) MCG/ACT inhaler USE 1 - 2 PUFFS EVERY 6 HOURS AS NEEDED (Needs to be seen before next refill)  . cetirizine (ZYRTEC) 10 MG tablet Take 10 mg by mouth 2 (two) times daily.   . clobetasol cream (TEMOVATE) 3.54 % Apply 1 application topically 2 (two) times daily. (Patient taking differently: Apply 1 application topically 2 (two) times daily as needed (irritation).)  . clobetasol ointment (TEMOVATE) 5.62 % Apply 1 application topically 2 (two) times daily as needed (irritation).   . furosemide (LASIX) 20 MG tablet Take 1 tablet (20 mg total) by mouth daily for 3 days.  . methocarbamol (ROBAXIN) 500 MG tablet Take 1 tablet (500 mg total) by mouth every 8 (eight) hours as needed for muscle spasms.  . ondansetron (ZOFRAN-ODT) 4 MG disintegrating tablet DISSOLVE 1 TABLET BY MOUTH EVERY 8 HOURS AS NEEDED FOR NAUSEA OR VOMITING  . scopolamine (TRANSDERM-SCOP, 1.5 MG,) 1 MG/3DAYS Place 1 patch (1.5 mg total) onto the skin every 3 (three) days. (Patient taking differently: Place 1 patch onto the skin every 3 (three) days. As needed)  . VYVANSE 10 MG capsule Take 10 mg by mouth daily as needed (extra focus).   Marland Kitchen VYVANSE 30 MG capsule Take 30 mg by mouth daily.    No facility-administered encounter medications on file as of 12/29/2020.   ALLERGIES: Allergies  Allergen Reactions  . Prozac [Fluoxetine Hcl] Nausea Only  . Latex Rash    VACCINATION STATUS: Immunization History  Administered Date(s) Administered  . DTaP 01/25/1988, 10/31/1988, 12/30/1991  . Hepatitis B 06/14/1998, 08/02/1998, 12/13/1998  . HiB (PRP-OMP) 01/01/1990  . IPV 10/31/1988, 12/30/1991  . Influenza,inj,Quad PF,6+ Mos 07/13/2015  . Influenza-Unspecified 07/19/2016  . MMR 03/02/2015, 05/16/2015  . PPD Test 02/14/2015, 02/28/2015, 03/01/2016  . Tdap 12/13/2011    HPI [name] is 35 y.o. female who presents today to discuss her lab work and imaging studies.     She was seen during her last visit in consultation for right adrenal incidentaloma requested by Caitlin Balloon, FNP.  History is obtained directly from the patient as well as chart review.  She denies any prior history of adrenal,  Thyroid, pituitary dysfunctions. In August 2021, while undergoing work-up for left abdominal pain, CT scan incidentally discovered 1.5 cm low-attenuation right adrenal mass described as adenoma.  Prior abdominal CT from  2015 did not show this mass lesion. -She underwent repeat CT abdomen/pelvis on December 04, 2020 which confirmed 1.5 cm intermediate liver attenuating right adrenal nodule with features of adenoma.  Her previsit endocrine work-up is largely normal except slight elevation in 24-hour free cortisol at 60 mcg. She has no new complaints.  See notes from previous visit.  She denies hirsutism.  She has minimally fluctuating body weight. -She denies rapid weight changes.  She is a mother of 3 children status post bilateral ovary sparing hysterectomy in 2021. -She denies family history of endocrinopathies.  She is a non-smoker, uses alcohol only socially.  Review of Systems  Constitutional: +no recent major weight  changes,  + intermittent subjective hyperthermia, no subjective hypothermia   Objective:    Vitals with BMI 12/29/2020 11/24/2020 11/15/2020  Height $Remov'5\' 2"'HtBUFY$  $Remove'5\' 2"'oJLsGIn$  $RemoveB'5\' 2"'PoONChtq$   Weight 179 lbs 177 lbs 6 oz 180 lbs 13 oz  BMI 32.73 10.17 51.02  Systolic 585 277 824  Diastolic 86 76 90  Pulse 76 68 114    BP 118/86   Pulse 76   Ht $R'5\' 2"'FU$  (1.575 m)   Wt 179 lb (81.2 kg)   BMI 32.74 kg/m   Wt Readings from Last 3 Encounters:  12/29/20 179 lb (81.2 kg)  11/24/20 177 lb 6.4 oz (80.5 kg)  11/15/20 180 lb 12.8 oz (82 kg)    Physical Exam  Constitutional:  Body mass index is 32.74 kg/m.,  not in acute distress, normal state of mind   CMP ( most recent) CMP     Component Value Date/Time   NA 140 12/27/2020 1648   K 4.0 12/27/2020 1648   CL 103 12/27/2020 1648   CO2 23 12/27/2020 1648   GLUCOSE 126 (H) 12/27/2020 1648   GLUCOSE 95 05/21/2020 1905   BUN 10 12/27/2020 1648   CREATININE 0.87 12/27/2020 1648   CALCIUM 9.0 12/27/2020 1648   PROT 7.6 05/21/2020 1905   PROT 6.5 11/22/2019 1529   ALBUMIN 4.6 12/27/2020 1648   AST 19 05/21/2020 1905   ALT 22 05/21/2020 1905   ALKPHOS 60 05/21/2020 1905   BILITOT 0.8 05/21/2020 1905   BILITOT 0.3 11/22/2019 1529   GFRNONAA >60 05/21/2020 1905   GFRAA >60 05/21/2020 1905    Lipid Panel ( most recent) Lipid Panel     Component  Value Date/Time   CHOL 136 06/21/2019 1527   TRIG 72 06/21/2019 1527   HDL 51 06/21/2019 1527   CHOLHDL 2.7 06/21/2019 1527   LDLCALC 71 06/21/2019 1527   LABVLDL 14 06/21/2019 1527      Lab Results  Component Value Date   TSH 1.560 06/21/2019   TSH 1.600 07/19/2015      Assessment & Plan:   1. Adrenal adenoma, right 2.  Glucose intolerance  -Her endocrine work-up rules out pheochromocytoma, primary hyperaldosteronism.  Her DHEA-sulfate is normal.  Her 24-hour urine free cortisol is slightly elevated at 60 inconclusive for Cushing syndrome. - Based on these reviews, she has right adrenal incidentaloma measuring 1.5 cm, intermediately attenuating right adrenal nodule with features of adenoma.   She will not need immediate surgical intervention, will be considered for repeat adrenal protocol CT scan in August 2022 for follow-up.  -Her pretest probability for pheochromocytoma as well as for endogenous glucocorticoid excess is very low.  She will not need immediate intervention for slight hypercortisolemia, could be pseudo-Cushing's. She will have repeat 24-hour urine free cortisol.  If that returns still abnormal, she will be considered for 1 mg overnight dexamethasone suppression test. -Her blood pressure is controlled at 118/86, during prior visit 124/76.  I did not initiate any new prescriptions today. Her point-of-care A1c is 5.3% indicating absence of prediabetes/diabetes.  - Suggestion is made for her to avoid simple carbohydrates  from her diet including Cakes, Sweet Desserts, Ice Cream, Soda (diet and regular), Sweet Tea, Candies, Chips, Cookies, Store Bought Juices, Alcohol in Excess of  1-2 drinks a day, Artificial Sweeteners,  Coffee Creamer, and "Sugar-free" Products, Lemonade. This will help patient to have more stable blood glucose profile and potentially avoid unintended weight gain.   - she is advised to maintain close follow up  with Caitlin Balloon, FNP for primary  care needs.      - Time spent on this patient care encounter:  30 minutes of which 50% was spent in  counseling and the rest reviewing  her current and  previous labs / studies and medications  doses and developing a plan for long term care, and documenting this care. [name]  participated in the discussions, expressed understanding, and voiced agreement with the above plans.  All questions were answered to her satisfaction. she is encouraged to contact clinic should she have any questions or concerns prior to her return visit.   Follow up plan: Return in about 5 months (around 05/31/2021), or CT Adrenals, for 24 Hr Urine Free Cortisol & [place], MD [facility] [address] [place], [place] [address] Phone: [phone]  Fax: [phone]     12/29/2020, 11:54 AM  This note was partially dictated with voice recognition software. Similar sounding words can be transcribed inadequately or may not  be corrected upon review.

## 2021-01-22 ENCOUNTER — Other Ambulatory Visit (HOSPITAL_COMMUNITY): Payer: Self-pay

## 2021-01-22 MED ORDER — ALBUTEROL SULFATE HFA 108 (90 BASE) MCG/ACT IN AERS
INHALATION_SPRAY | RESPIRATORY_TRACT | 0 refills | Status: DC
  Filled 2021-01-22: qty 18, 25d supply, fill #0

## 2021-01-25 ENCOUNTER — Other Ambulatory Visit (HOSPITAL_COMMUNITY): Payer: Self-pay

## 2021-01-31 ENCOUNTER — Other Ambulatory Visit (HOSPITAL_COMMUNITY): Payer: Self-pay

## 2021-01-31 MED ORDER — VYVANSE 30 MG PO CAPS
30.0000 mg | ORAL_CAPSULE | Freq: Every day | ORAL | 0 refills | Status: DC
  Filled 2021-01-31: qty 30, 30d supply, fill #0

## 2021-02-02 ENCOUNTER — Other Ambulatory Visit (HOSPITAL_COMMUNITY): Payer: Self-pay

## 2021-02-05 DIAGNOSIS — D3501 Benign neoplasm of right adrenal gland: Secondary | ICD-10-CM | POA: Diagnosis not present

## 2021-02-05 DIAGNOSIS — N130 Hydronephrosis with ureteropelvic junction obstruction: Secondary | ICD-10-CM | POA: Diagnosis not present

## 2021-02-06 ENCOUNTER — Other Ambulatory Visit (HOSPITAL_COMMUNITY): Payer: Self-pay

## 2021-02-07 ENCOUNTER — Other Ambulatory Visit (HOSPITAL_COMMUNITY): Payer: Self-pay

## 2021-02-07 DIAGNOSIS — F902 Attention-deficit hyperactivity disorder, combined type: Secondary | ICD-10-CM | POA: Diagnosis not present

## 2021-02-07 DIAGNOSIS — Z79899 Other long term (current) drug therapy: Secondary | ICD-10-CM | POA: Diagnosis not present

## 2021-02-07 MED ORDER — BUPROPION HCL ER (XL) 150 MG PO TB24
1.0000 | ORAL_TABLET | Freq: Every day | ORAL | 0 refills | Status: DC
  Filled 2021-02-07: qty 90, 90d supply, fill #0

## 2021-03-01 ENCOUNTER — Other Ambulatory Visit (HOSPITAL_COMMUNITY): Payer: Self-pay

## 2021-03-05 ENCOUNTER — Ambulatory Visit: Payer: 59 | Admitting: Family Medicine

## 2021-03-05 ENCOUNTER — Other Ambulatory Visit: Payer: Self-pay | Admitting: Family Medicine

## 2021-03-05 DIAGNOSIS — Z111 Encounter for screening for respiratory tuberculosis: Secondary | ICD-10-CM

## 2021-03-07 LAB — QUANTIFERON-TB GOLD PLUS
QuantiFERON Mitogen Value: >10 IU/mL
QuantiFERON Nil Value: 0.09 IU/mL
QuantiFERON TB1 Ag Value: 0.07 IU/mL
QuantiFERON TB2 Ag Value: 0.11 IU/mL
QuantiFERON-TB Gold Plus: NEGATIVE

## 2021-03-22 ENCOUNTER — Other Ambulatory Visit (HOSPITAL_COMMUNITY): Payer: Self-pay

## 2021-03-22 MED ORDER — VYVANSE 30 MG PO CAPS
ORAL_CAPSULE | ORAL | 0 refills | Status: DC
  Filled 2021-03-22: qty 30, 30d supply, fill #0

## 2021-03-23 ENCOUNTER — Ambulatory Visit (INDEPENDENT_AMBULATORY_CARE_PROVIDER_SITE_OTHER): Payer: 59 | Admitting: Family

## 2021-03-23 ENCOUNTER — Other Ambulatory Visit: Payer: Self-pay

## 2021-03-23 ENCOUNTER — Encounter: Payer: Self-pay | Admitting: Family

## 2021-03-23 VITALS — BP 116/79 | HR 93 | Temp 98.4°F | Resp 20 | Ht 62.0 in | Wt 174.0 lb

## 2021-03-23 DIAGNOSIS — Z0001 Encounter for general adult medical examination with abnormal findings: Secondary | ICD-10-CM

## 2021-03-23 DIAGNOSIS — R109 Unspecified abdominal pain: Secondary | ICD-10-CM | POA: Diagnosis not present

## 2021-03-23 DIAGNOSIS — F411 Generalized anxiety disorder: Secondary | ICD-10-CM | POA: Diagnosis not present

## 2021-03-23 DIAGNOSIS — Z1159 Encounter for screening for other viral diseases: Secondary | ICD-10-CM

## 2021-03-23 DIAGNOSIS — Z0000 Encounter for general adult medical examination without abnormal findings: Secondary | ICD-10-CM

## 2021-03-23 DIAGNOSIS — J4520 Mild intermittent asthma, uncomplicated: Secondary | ICD-10-CM | POA: Diagnosis not present

## 2021-03-23 DIAGNOSIS — K219 Gastro-esophageal reflux disease without esophagitis: Secondary | ICD-10-CM

## 2021-03-23 DIAGNOSIS — D3501 Benign neoplasm of right adrenal gland: Secondary | ICD-10-CM | POA: Diagnosis not present

## 2021-03-23 DIAGNOSIS — E669 Obesity, unspecified: Secondary | ICD-10-CM

## 2021-03-23 DIAGNOSIS — Z01411 Encounter for gynecological examination (general) (routine) with abnormal findings: Secondary | ICD-10-CM | POA: Diagnosis not present

## 2021-03-23 DIAGNOSIS — F900 Attention-deficit hyperactivity disorder, predominantly inattentive type: Secondary | ICD-10-CM

## 2021-03-23 DIAGNOSIS — Z90710 Acquired absence of both cervix and uterus: Secondary | ICD-10-CM

## 2021-03-23 DIAGNOSIS — Z01419 Encounter for gynecological examination (general) (routine) without abnormal findings: Secondary | ICD-10-CM

## 2021-03-23 DIAGNOSIS — E278 Other specified disorders of adrenal gland: Secondary | ICD-10-CM

## 2021-03-23 LAB — WET PREP FOR TRICH, YEAST, CLUE
Clue Cell Exam: NEGATIVE
Trichomonas Exam: NEGATIVE
Yeast Exam: NEGATIVE

## 2021-03-23 LAB — URINALYSIS
Bilirubin, UA: NEGATIVE
Glucose, UA: NEGATIVE
Ketones, UA: NEGATIVE
Leukocytes,UA: NEGATIVE
Nitrite, UA: NEGATIVE
Protein,UA: NEGATIVE
Specific Gravity, UA: 1.01 (ref 1.005–1.030)
Urobilinogen, Ur: 0.2 mg/dL (ref 0.2–1.0)
pH, UA: 6 (ref 5.0–7.5)

## 2021-03-23 NOTE — Progress Notes (Addendum)
Subjective:    Patient ID: [name], female    DOB: 02-Nov-1985, 35 y.o.   MRN: [id number]  Chief Complaint  Patient presents with   Annual Exam   Pt presents to the office today for CPE  with pelvic. She had a LAVH 2021. She is followed by GYN.  She is followed by Endocrinologists for adrenal mass. She is followed by Urologists for kidney stone and renal cysts. She is followed by [facility] every 3 months for ADHD and GAD.  Asthma She complains of cough, shortness of breath and wheezing. This is a chronic problem. The current episode started more than 1 year ago. The cough is non-productive. Associated symptoms include heartburn. Her past medical history is significant for asthma.  Gastroesophageal Reflux She complains of abdominal pain, belching, coughing, heartburn and wheezing. This is a chronic problem. The current episode started more than 1 year ago. The problem occurs constantly. The problem has been waxing and waning. The symptoms are aggravated by certain foods. She has tried a histamine-2 antagonist for the symptoms. The treatment provided no relief.  Anxiety Presents for follow-up visit. Symptoms include depressed mood, excessive worry, irritability, nervous/anxious behavior, restlessness and shortness of breath. Symptoms occur most days. The severity of symptoms is moderate. The quality of sleep is good.   Her past medical history is significant for asthma.  Gynecologic Exam The patient's primary symptoms include genital itching. The patient's pertinent negatives include no genital lesions or vaginal bleeding. The problem has been resolved. Associated symptoms include abdominal pain.  Abdominal Pain This is a new problem. The current episode started 1 to 4 weeks ago. The onset quality is gradual. The pain is located in the LLQ and RLQ. The pain is at a severity of 2/10 (when it hits 8). The pain is moderate. The quality of the pain is aching. Associated symptoms include  belching. Her past medical history is significant for GERD.     Review of Systems  Constitutional:  Positive for irritability.  Respiratory:  Positive for cough, shortness of breath and wheezing.   Gastrointestinal:  Positive for abdominal pain and heartburn.  Psychiatric/Behavioral:  The patient is nervous/anxious.   All other systems reviewed and are negative.  Family History  Problem Relation Age of Onset   Alcohol abuse Father    Diabetes Father    Arthritis Maternal Aunt    Arthritis Maternal Grandmother    COPD Maternal Grandmother    Thyroid disease Maternal Grandmother    Alcohol abuse Maternal Grandfather    Cancer Maternal Grandfather        liver, pancreatic   Cancer Paternal Grandmother        brain   Alcohol abuse Paternal Grandfather    Stroke Paternal Grandfather    Heart disease Neg Hx        early heart disease   Sudden death Neg Hx        cardiac   Cardiomyopathy Neg Hx    Rectal cancer Neg Hx    Stomach cancer Neg Hx    Esophageal cancer Neg Hx    Colon cancer Neg Hx    Family History  Problem Relation Age of Onset   Alcohol abuse Father    Diabetes Father    Arthritis Maternal Aunt    Arthritis Maternal Grandmother    COPD Maternal Grandmother    Thyroid disease Maternal Grandmother    Alcohol abuse Maternal Grandfather    Cancer Maternal Grandfather  liver, pancreatic   Cancer Paternal Grandmother        brain   Alcohol abuse Paternal Grandfather    Stroke Paternal Grandfather    Heart disease Neg Hx        early heart disease   Sudden death Neg Hx        cardiac   Cardiomyopathy Neg Hx    Rectal cancer Neg Hx    Stomach cancer Neg Hx    Esophageal cancer Neg Hx    Colon cancer Neg Hx        Objective:   Physical Exam Vitals reviewed.  Constitutional:      General: She is not in acute distress.    Appearance: She is well-developed.  HENT:     Head: Normocephalic and atraumatic.     Right Ear: Tympanic membrane normal.      Left Ear: Tympanic membrane normal.  Eyes:     Pupils: Pupils are equal, round, and reactive to light.  Neck:     Thyroid: No thyromegaly.  Cardiovascular:     Rate and Rhythm: Normal rate and regular rhythm.     Heart sounds: Normal heart sounds. No murmur heard. Pulmonary:     Effort: Pulmonary effort is normal. No respiratory distress.     Breath sounds: Normal breath sounds. No wheezing.  Chest:  Breasts:    Right: No swelling, bleeding, inverted nipple, mass, nipple discharge, skin change or tenderness.     Left: No swelling, bleeding, inverted nipple, mass, nipple discharge, skin change or tenderness.  Abdominal:     General: Bowel sounds are normal. There is no distension.     Palpations: Abdomen is soft.     Tenderness: There is abdominal tenderness (LLQ).  Genitourinary:    Comments: Bimanual exam- no adnexal masses or tenderness, ovaries nonpalpable   Cervix  not present- No discharge  Musculoskeletal:        General: No tenderness. Normal range of motion.     Cervical back: Normal range of motion and neck supple.  Skin:    General: Skin is warm and dry.  Neurological:     Mental Status: She is alert and oriented to person, place, and time.     Cranial Nerves: No cranial nerve deficit.     Deep Tendon Reflexes: Reflexes are normal and symmetric.  Psychiatric:        Behavior: Behavior normal.        Thought Content: Thought content normal.        Judgment: Judgment normal.     BP 116/79   Pulse 93   Temp 98.4 F (36.9 C) (Temporal)   Resp 20   Ht $R'5\' 2"'cT$  (1.575 m)   Wt 174 lb (78.9 kg)   SpO2 96%   BMI 31.83 kg/m       Assessment & Plan:  [name] comes in today with chief complaint of Annual Exam   Diagnosis and orders addressed:  1. Annual physical exam - Urinalysis - CMP14+EGFR - CBC with Differential/Platelet - Lipid panel - TSH - Hepatitis C antibody - Cytology - PAP([facility])  2. Mild intermittent chronic asthma without  complication - WUJ81+XBJY - CBC with Differential/Platelet  3. Gastroesophageal reflux disease without esophagitis - CMP14+EGFR - CBC with Differential/Platelet  4. Generalized anxiety disorder - CMP14+EGFR - CBC with Differential/Platelet  5. Gynecologic exam normal - CMP14+EGFR - CBC with Differential/Platelet - Cytology - PAP()  6. Need for hepatitis C screening test - CMP14+EGFR -  CBC with Differential/Platelet - Hepatitis C antibody  7. Adrenal adenoma, right - CMP14+EGFR - CBC with Differential/Platelet  8. Status post laparoscopic assisted vaginal hysterectomy (LAVH) - CMP14+EGFR - CBC with Differential/Platelet  9. Right adrenal mass (HCC) - CMP14+EGFR - CBC with Differential/Platelet  10. Attention deficit hyperactivity disorder (ADHD), predominantly inattentive type - CMP14+EGFR - CBC with Differential/Platelet  11. Obesity (BMI 30-39.9) - CMP14+EGFR - CBC with Differential/Platelet  12. Abdominal pain, unspecified abdominal location - CMP14+EGFR - CBC with Differential/Platelet - WET PREP FOR TRICH, YEAST, CLUE - US Pelvic Complete With Transvaginal; Future   Labs pending Health Maintenance reviewed Diet and exercise encouraged  Follow up plan: 6 months    [name], FNP

## 2021-03-23 NOTE — Addendum Note (Signed)
Addended by: [name] A on: 03/23/2021 04:03 PM   Modules accepted: Orders

## 2021-03-23 NOTE — Patient Instructions (Signed)

## 2021-03-24 LAB — CBC WITH DIFFERENTIAL/PLATELET
Basophils Absolute: 0.1 10*3/uL (ref 0.0–0.2)
Basos: 1 %
EOS (ABSOLUTE): 0.2 10*3/uL (ref 0.0–0.4)
Eos: 2 %
Hematocrit: 44.6 % (ref 34.0–46.6)
Hemoglobin: 15.3 g/dL (ref 11.1–15.9)
Immature Grans (Abs): 0 10*3/uL (ref 0.0–0.1)
Immature Granulocytes: 0 %
Lymphocytes Absolute: 1.7 10*3/uL (ref 0.7–3.1)
Lymphs: 18 %
MCH: 29.6 pg (ref 26.6–33.0)
MCHC: 34.3 g/dL (ref 31.5–35.7)
MCV: 86 fL (ref 79–97)
Monocytes Absolute: 0.7 10*3/uL (ref 0.1–0.9)
Monocytes: 7 %
Neutrophils Absolute: 6.8 10*3/uL (ref 1.4–7.0)
Neutrophils: 72 %
Platelets: 319 10*3/uL (ref 150–450)
RBC: 5.17 x10E6/uL (ref 3.77–5.28)
RDW: 12.3 % (ref 11.7–15.4)
WBC: 9.4 10*3/uL (ref 3.4–10.8)

## 2021-03-24 LAB — CMP14+EGFR
ALT: 27 IU/L (ref 0–32)
AST: 16 IU/L (ref 0–40)
Albumin/Globulin Ratio: 1.5 (ref 1.2–2.2)
Albumin: 4.3 g/dL (ref 3.8–4.8)
Alkaline Phosphatase: 77 IU/L (ref 44–121)
BUN/Creatinine Ratio: 12 (ref 9–23)
BUN: 11 mg/dL (ref 6–20)
Bilirubin Total: 0.5 mg/dL (ref 0.0–1.2)
CO2: 22 mmol/L (ref 20–29)
Calcium: 9.3 mg/dL (ref 8.7–10.2)
Chloride: 103 mmol/L (ref 96–106)
Creatinine, Ser: 0.95 mg/dL (ref 0.57–1.00)
Globulin, Total: 2.8 g/dL (ref 1.5–4.5)
Glucose: 96 mg/dL (ref 65–99)
Potassium: 4.2 mmol/L (ref 3.5–5.2)
Sodium: 141 mmol/L (ref 134–144)
Total Protein: 7.1 g/dL (ref 6.0–8.5)
eGFR: 81 mL/min/{1.73_m2} (ref >59)

## 2021-03-24 LAB — TSH: TSH: 0.831 u[IU]/mL (ref 0.450–4.500)

## 2021-03-24 LAB — LIPID PANEL
Chol/HDL Ratio: 3.2 ratio (ref 0.0–4.4)
Cholesterol, Total: 136 mg/dL (ref 100–199)
HDL: 42 mg/dL (ref >39)
LDL Chol Calc (NIH): 77 mg/dL (ref 0–99)
Triglycerides: 87 mg/dL (ref 0–149)
VLDL Cholesterol Cal: 17 mg/dL (ref 5–40)

## 2021-03-24 LAB — HEPATITIS C ANTIBODY: Hep C Virus Ab: <0.1 s/co ratio (ref 0.0–0.9)

## 2021-04-24 ENCOUNTER — Ambulatory Visit: Payer: 59 | Attending: Family

## 2021-05-08 ENCOUNTER — Other Ambulatory Visit (HOSPITAL_COMMUNITY): Payer: Self-pay

## 2021-05-08 MED ORDER — VYVANSE 30 MG PO CAPS
ORAL_CAPSULE | ORAL | 0 refills | Status: DC
  Filled 2021-05-08: qty 30, 30d supply, fill #0

## 2021-05-24 ENCOUNTER — Other Ambulatory Visit: Payer: Self-pay

## 2021-05-24 ENCOUNTER — Ambulatory Visit (HOSPITAL_COMMUNITY)
Admission: RE | Admit: 2021-05-24 | Discharge: 2021-05-24 | Disposition: A | Discharge status: Home / Self Care | Payer: 59 | Source: Ambulatory Visit | Attending: "Endocrinology | Admitting: "Endocrinology

## 2021-05-24 DIAGNOSIS — D3501 Benign neoplasm of right adrenal gland: Secondary | ICD-10-CM | POA: Insufficient documentation

## 2021-05-24 DIAGNOSIS — N281 Cyst of kidney, acquired: Secondary | ICD-10-CM | POA: Diagnosis not present

## 2021-05-24 DIAGNOSIS — Z90710 Acquired absence of both cervix and uterus: Secondary | ICD-10-CM | POA: Diagnosis not present

## 2021-05-24 DIAGNOSIS — N200 Calculus of kidney: Secondary | ICD-10-CM | POA: Diagnosis not present

## 2021-05-24 MED ORDER — IOHEXOL 350 MG/ML SOLN
100.0000 mL | Freq: Once | INTRAVENOUS | Status: AC | PRN
  Administered 2021-05-24: 100 mL via INTRAVENOUS

## 2021-05-24 MED ORDER — IOHEXOL 9 MG/ML PO SOLN
ORAL | Status: AC
  Filled 2021-05-24: qty 1000

## 2021-05-28 ENCOUNTER — Other Ambulatory Visit (HOSPITAL_COMMUNITY): Payer: Self-pay

## 2021-05-28 DIAGNOSIS — D3501 Benign neoplasm of right adrenal gland: Secondary | ICD-10-CM | POA: Diagnosis not present

## 2021-05-29 ENCOUNTER — Other Ambulatory Visit (HOSPITAL_COMMUNITY): Payer: Self-pay

## 2021-05-29 LAB — CREATININE, URINE, 24 HOUR
Creatinine, 24H Ur: 1706 mg/24 hr (ref 800–1800)
Creatinine, Urine: 63.2 mg/dL

## 2021-05-29 MED ORDER — BUPROPION HCL ER (XL) 150 MG PO TB24
150.0000 mg | ORAL_TABLET | Freq: Every day | ORAL | 6 refills | Status: DC
  Filled 2021-05-29: qty 90, 90d supply, fill #0
  Filled 2021-09-10: qty 90, 90d supply, fill #1
  Filled 2021-12-31: qty 90, 90d supply, fill #2
  Filled 2022-03-27: qty 90, 90d supply, fill #3

## 2021-05-31 ENCOUNTER — Encounter: Payer: Self-pay | Admitting: "Endocrinology

## 2021-05-31 ENCOUNTER — Other Ambulatory Visit: Payer: Self-pay

## 2021-05-31 ENCOUNTER — Ambulatory Visit: Payer: 59 | Admitting: "Endocrinology

## 2021-05-31 VITALS — BP 126/86 | HR 93 | Ht 62.0 in | Wt 175.2 lb

## 2021-05-31 DIAGNOSIS — D3501 Benign neoplasm of right adrenal gland: Secondary | ICD-10-CM

## 2021-05-31 LAB — CORTISOL, URINE, FREE
Cortisol (Ur), Free: 43 ug/24 hr — ABNORMAL HIGH (ref 6–42)
Cortisol,F,ug/L,U: 16 ug/L

## 2021-05-31 MED ORDER — DEXAMETHASONE 1 MG PO TABS: ORAL_TABLET | ORAL | 0 refills | Status: AC

## 2021-05-31 NOTE — Progress Notes (Signed)
05/31/2021, 12:25 PM   Endocrinology follow-up note  Subjective:    Patient ID: [name], female    DOB: [birth year], PCP Sharion Balloon, FNP   Past Medical History:  Diagnosis Date   Anxiety    Asthma    Concussion    Depression    Dysrhythmia    Family history of adverse reaction to anesthesia    GERD (gastroesophageal reflux disease)    Headache(784.0)    Migraines   Herpes    History of kidney stones    Keratosis    PONV (postoperative nausea and vomiting)    prolonged sedation   Postural orthostatic tachycardia syndrome    Tingling in extremities    occ   Past Surgical History:  Procedure Laterality Date   BLADDER SUSPENSION N/A 05/01/2016   Procedure: TRANSVAGINAL TAPE (TVT) PROCEDURE;  Surgeon: Cheri Fowler, MD;  Location: [facility];  Service: Gynecology;  Laterality: N/A;   BREAST ENHANCEMENT SURGERY     CYSTOCELE REPAIR N/A 05/01/2016   Procedure: ANTERIOR REPAIR (CYSTOCELE);  Surgeon: Cheri Fowler, MD;  Location: [facility];  Service: Gynecology;  Laterality: N/A;   CYSTOSCOPY N/A 02/24/2020   Procedure: POSSIBLE CYSTOSCOPY;  Surgeon: Janyth Contes, MD;  Location: [facility];  Service: Gynecology;  Laterality: N/A;  possible   LAPAROSCOPIC VAGINAL HYSTERECTOMY WITH SALPINGECTOMY Bilateral 02/24/2020   Procedure: LAPAROSCOPIC ASSISTED VAGINAL HYSTERECTOMY WITH SALPINGECTOMY;  Surgeon: Janyth Contes, MD;  Location: [facility];  Service: Gynecology;  Laterality: Bilateral;   RECTOCELE REPAIR N/A 02/24/2020   Procedure: POSTERIOR REPAIR (RECTOCELE) AND POSSIBLE ANTERIOR REPAIR;  Surgeon: Janyth Contes, MD;  Location: [facility];  Service: Gynecology;  Laterality: N/A;   Social History   Socioeconomic History   Marital status: Married    Spouse name: Jeneen Rinks   Number of children: Not on file   Years of education: 3   Highest education level:  Not on file  Occupational History   Not on file  Tobacco Use   Smoking status: Never   Smokeless tobacco: Never  Vaping Use   Vaping Use: Never used  Substance and Sexual Activity   Alcohol use: Yes    Comment: occas   Drug use: No   Sexual activity: Yes    Birth control/protection: I.U.D.  Other Topics Concern   Not on file  Social History Narrative   Married, 2 children.    Social Determinants of Health   Financial Resource Strain: Not on file  Food Insecurity: Not on file  Transportation Needs: Not on file  Physical Activity: Not on file  Stress: Not on file  Social Connections: Not on file   Family History  Problem Relation Age of Onset   Alcohol abuse Father    Diabetes Father    Arthritis Maternal Aunt    Arthritis Maternal Grandmother    COPD Maternal Grandmother    Thyroid disease Maternal Grandmother    Alcohol abuse Maternal Grandfather    Cancer Maternal Grandfather        liver, pancreatic   Cancer Paternal Grandmother        brain   Alcohol abuse Paternal Grandfather    Stroke Paternal Grandfather    Heart  disease Neg Hx        early heart disease   Sudden death Neg Hx        cardiac   Cardiomyopathy Neg Hx    Rectal cancer Neg Hx    Stomach cancer Neg Hx    Esophageal cancer Neg Hx    Colon cancer Neg Hx    Outpatient Encounter Medications as of 05/31/2021  Medication Sig   dexamethasone (DECADRON) 1 MG tablet Take 46m at 11PM the night before Cortisol draw the next morning before 8AM.   albuterol (VENTOLIN HFA) 108 (90 Base) MCG/ACT inhaler Inhale 1-2 puffs into the lungs every 6 hours as needed   buPROPion (WELLBUTRIN XL) 150 MG 24 hr tablet Take 1 tablet by mouth daily   cetirizine (ZYRTEC) 10 MG tablet Take 10 mg by mouth 2 (two) times daily.  (Patient not taking: Reported on 03/23/2021)   clobetasol ointment (TEMOVATE) 00.93% Apply 1 application topically 2 (two) times daily as needed (irritation).   hydrOXYzine (ATARAX/VISTARIL) 25 MG  tablet TAKE 1 TABLET BY MOUTH 3 TIMES DAILY AS NEEDED (Patient not taking: Reported on 05/31/2021)   lisdexamfetamine (VYVANSE) 30 MG capsule TAKE 1 CAPSULE BY MOUTH ONCE A DAY   lisdexamfetamine (VYVANSE) 30 MG capsule Take 1 capsule by mouth daily   methocarbamol (ROBAXIN) 500 MG tablet TAKE 1 TABLET BY MOUTH EVERY 8 HOURS AS NEEDED FOR MUSCLE SPASMS   ondansetron (ZOFRAN-ODT) 4 MG disintegrating tablet DISSOLVE 1 TABLET BY MOUTH EVERY 8 HOURS AS NEEDED FOR NAUSEA OR VOMITING   Pramoxine-HC 1-2.5 % OINT APPLY TWO TIMES DAILY AS NEEDED (Patient not taking: Reported on 03/23/2021)   scopolamine (TRANSDERM-SCOP, 1.5 MG,) 1 MG/3DAYS Place 1 patch (1.5 mg total) onto the skin every 3 (three) days. (Patient not taking: Reported on 03/23/2021)   triamcinolone (KENALOG) 0.1 % APPLY A THIN LAYER TO THE AFFECTED AREA TWO TIMES DAILY (Patient not taking: Reported on 03/23/2021)   VYVANSE 10 MG capsule Take 10 mg by mouth daily as needed (extra focus).    [DISCONTINUED] lisdexamfetamine (VYVANSE) 10 MG capsule TAKE 1 CAPSULE BY MOUTH ONCE A DAY   [DISCONTINUED] lisdexamfetamine (VYVANSE) 30 MG capsule TAKE 1 CAPSULE BY MOUTH ONCE A DAY   [DISCONTINUED] lisdexamfetamine (VYVANSE) 30 MG capsule TAKE 1 CAPSULE BY MOUTH DAILY   [DISCONTINUED] lisdexamfetamine (VYVANSE) 30 MG capsule TAKE 1 CAPSULE BY MOUTH ONCE A DAY   No facility-administered encounter medications on file as of 05/31/2021.   ALLERGIES: Allergies  Allergen Reactions   Prozac [Fluoxetine Hcl] Nausea Only   Latex Rash    VACCINATION STATUS: Immunization History  Administered Date(s) Administered   DTaP 01/25/1988, 10/31/1988, 12/30/1991   Hepatitis B 06/14/1998, 08/02/1998, 12/13/1998   HiB (PRP-OMP) 01/01/1990   IPV 10/31/1988, 12/30/1991   Influenza,inj,Quad PF,6+ Mos 07/13/2015   Influenza-Unspecified 07/19/2016   MMR 03/02/2015, 05/16/2015   PFIZER(Purple Top)SARS-COV-2 Vaccination 09/21/2019, 10/12/2019   PPD Test 02/14/2015,  02/28/2015, 03/01/2016   Tdap 12/13/2011    HPI [name] 35y.o. female who presents today to discuss her lab work and imaging studies.     She was seen during her last visit in consultation for right adrenal incidentaloma requested by HSharion Balloon FNP.  History is obtained directly from the patient as well as chart review.  She denies any prior history of adrenal,  Thyroid, pituitary dysfunctions. In August 2021, while undergoing work-up for left abdominal pain, CT scan incidentally discovered 1.5 cm low-attenuation right adrenal mass described as  adenoma.  Prior abdominal CT from  2015 did not show this mass lesion. -She underwent repeat CT abdomen/pelvis on December 04, 2020 which confirmed 1.5 cm intermediate liver attenuating right adrenal nodule with features of adenoma.   Her previsit CT scan of abdomen with and without contrast on May 24, 2021 redemonstrated 1.5 x 2 cm right adrenal mass which shows contrast washout consistent with a benign adrenal adenoma.  The left adrenal gland is normal in appearance.  Her repeat 24-hour urine free cortisol is 43 down from 60 mcg.  Normal range for that is 6-42 mcg.  She has no new complaints.  See notes from previous visit.  She denies hirsutism.  She has minimally fluctuating body weight. -She denies rapid weight changes.  She is a mother of 3 children status post bilateral ovary sparing hysterectomy in 2021. -She denies family history of endocrinopathies.  She is a non-smoker, uses alcohol only socially.  Review of Systems  Constitutional: +no recent major weight changes,  + intermittent subjective hyperthermia, no subjective hypothermia   Objective:    Vitals with BMI 05/31/2021 03/23/2021 12/29/2020  Height _0  _1  _2   Weight 175 lbs 3 oz 174 lbs 179 lbs  BMI 32.04 63.01 60.10  Systolic 932 355 732  Diastolic 86 79 86  Pulse 93 93 76    BP 126/86   Pulse 93   Ht _3  (1.575 m)   Wt 175 lb 3.2 oz (79.5 kg)   BMI  32.04 kg/m   Wt Readings from Last 3 Encounters:  05/31/21 175 lb 3.2 oz (79.5 kg)  03/23/21 174 lb (78.9 kg)  12/29/20 179 lb (81.2 kg)    Physical Exam  Constitutional:  Body mass index is 32.04 kg/m.,  not in acute distress, normal state of mind   CMP ( most recent) CMP     Component Value Date/Time   NA 141 03/23/2021 1321   K 4.2 03/23/2021 1321   CL 103 03/23/2021 1321   CO2 22 03/23/2021 1321   GLUCOSE 96 03/23/2021 1321   GLUCOSE 95 05/21/2020 1905   BUN 11 03/23/2021 1321   CREATININE 0.95 03/23/2021 1321   CALCIUM 9.3 03/23/2021 1321   PROT 7.1 03/23/2021 1321   ALBUMIN 4.3 03/23/2021 1321   AST 16 03/23/2021 1321   ALT 27 03/23/2021 1321   ALKPHOS 77 03/23/2021 1321   BILITOT 0.5 03/23/2021 1321   GFRNONAA >60 05/21/2020 1905   GFRAA >60 05/21/2020 1905    Lipid Panel ( most recent) Lipid Panel     Component Value Date/Time   CHOL 136 03/23/2021 1321   TRIG 87 03/23/2021 1321   HDL 42 03/23/2021 1321   CHOLHDL 3.2 03/23/2021 1321   LDLCALC 77 03/23/2021 1321   LABVLDL 17 03/23/2021 1321      Lab Results  Component Value Date   TSH 0.831 03/23/2021   TSH 1.560 06/21/2019   TSH 1.600 07/19/2015      Assessment & Plan:   1. Adrenal adenoma, right 2.  Glucose intolerance  -Her endocrine work-up rules out pheochromocytoma, primary hyperaldosteronism.  Her DHEA-sulfate is normal.  Her 24-hour urine free cortisol is slightly elevated at 60 inconclusive for Cushing syndrome. - Based on these reviews, she has right adrenal incidentaloma measuring 1.5 cm, intermediately attenuating right adrenal nodule with features of adenoma.  Repeat CT demonstrates similar size and washout consistent with benign adenoma.  She will not need immediate surgical intervention, will be considered for repeat  adrenal protocol CT scan in August 2022 for follow-up.   -Her pretest probability for pheochromocytoma as well as for endogenous glucocorticoid excess is very low.   She will not need immediate intervention for slight hypercortisolemia, could be pseudo-Cushing's. She had repeat 24-hour urine free cortisol which is 43 now (normal 6-42).  It is unlikely that she has Cushing's syndrome.  She will be considered for 1 mg overnight dexamethasone suppression test.     -Her blood pressure is controlled at 126/86, previously 118/86, and 124/76.  She is not on any antihypertensive medications.  During prior visit, point-of-care A1c was 5.3% indicating absence of prediabetes/diabetes.   - she is advised to maintain close follow up with Sharion Balloon, FNP for primary care needs.    I spent 21 minutes in the care of the patient today including review of labs from Thyroid Function, CMP, and other relevant labs ; imaging/biopsy records (current and previous including abstractions from other facilities); face-to-face time discussing  her lab results and symptoms, medications doses, her options of short and long term treatment based on the latest standards of care / guidelines;   and documenting the encounter.  [name]  participated in the discussions, expressed understanding, and voiced agreement with the above plans.  All questions were answered to her satisfaction. she is encouraged to contact clinic should she have any questions or concerns prior to her return visit.   Follow up plan: Return in about 10 days (around 06/10/2021) for F/U with Pre-visit Labs.   Glade Lloyd, MD [facility] [address] [place], [place] [address] Phone: [phone]  Fax: [phone]     05/31/2021, 12:25 PM  This note was partially dictated with voice recognition software. Similar sounding words can be transcribed inadequately or may not  be corrected upon review.

## 2021-06-05 DIAGNOSIS — D3501 Benign neoplasm of right adrenal gland: Secondary | ICD-10-CM | POA: Diagnosis not present

## 2021-06-07 LAB — CORTISOL-AM, BLOOD: Cortisol - AM: 0.7 ug/dL — ABNORMAL LOW (ref 6.2–19.4)

## 2021-06-13 ENCOUNTER — Other Ambulatory Visit: Payer: Self-pay

## 2021-06-13 ENCOUNTER — Telehealth (INDEPENDENT_AMBULATORY_CARE_PROVIDER_SITE_OTHER): Payer: 59 | Admitting: "Endocrinology

## 2021-06-13 ENCOUNTER — Encounter: Payer: Self-pay | Admitting: "Endocrinology

## 2021-06-13 DIAGNOSIS — D3501 Benign neoplasm of right adrenal gland: Secondary | ICD-10-CM | POA: Diagnosis not present

## 2021-06-13 NOTE — Progress Notes (Signed)
06/13/2021, 5:07 PM                                 Endocrinology Telehealth Visit Follow up Note -During COVID -19 Pandemic  This visit type was conducted  via telephone due to national recommendations for restrictions regarding the COVID-19 Pandemic  in an effort to limit this patient's exposure and mitigate transmission of the corona virus.   I connected with [name] on 06/13/2021   by telephone and verified that I am speaking with the correct person using two identifiers. [name], 09-30-1986. she has verbally consented to this visit.  I, Glade Lloyd , MD was in my office and patient , [name] , was in her residence. No other persons were with me during the encounter. All issues noted in this document were discussed and addressed. The format was not optimal for physical exam.   Subjective:    Patient ID: [name], female    DOB: 30-Jan-1986, PCP Sharion Balloon, FNP   Past Medical History:  Diagnosis Date   Anxiety    Asthma    Concussion    Depression    Dysrhythmia    Family history of adverse reaction to anesthesia    GERD (gastroesophageal reflux disease)    Headache(784.0)    Migraines   Herpes    History of kidney stones    Keratosis    PONV (postoperative nausea and vomiting)    prolonged sedation   Postural orthostatic tachycardia syndrome    Tingling in extremities    occ   Past Surgical History:  Procedure Laterality Date   BLADDER SUSPENSION N/A 05/01/2016   Procedure: TRANSVAGINAL TAPE (TVT) PROCEDURE;  Surgeon: Cheri Fowler, MD;  Location: [facility];  Service: Gynecology;  Laterality: N/A;   BREAST ENHANCEMENT SURGERY     CYSTOCELE REPAIR N/A 05/01/2016   Procedure: ANTERIOR REPAIR (CYSTOCELE);  Surgeon: Cheri Fowler, MD;  Location: [facility];  Service: Gynecology;  Laterality: N/A;   CYSTOSCOPY N/A 02/24/2020   Procedure: POSSIBLE CYSTOSCOPY;  Surgeon: Janyth Contes, MD;  Location:  [facility];  Service: Gynecology;  Laterality: N/A;  possible   LAPAROSCOPIC VAGINAL HYSTERECTOMY WITH SALPINGECTOMY Bilateral 02/24/2020   Procedure: LAPAROSCOPIC ASSISTED VAGINAL HYSTERECTOMY WITH SALPINGECTOMY;  Surgeon: Janyth Contes, MD;  Location: [facility];  Service: Gynecology;  Laterality: Bilateral;   RECTOCELE REPAIR N/A 02/24/2020   Procedure: POSTERIOR REPAIR (RECTOCELE) AND POSSIBLE ANTERIOR REPAIR;  Surgeon: Janyth Contes, MD;  Location: [facility];  Service: Gynecology;  Laterality: N/A;   Social History   Socioeconomic History   Marital status: Married    Spouse name: Jeneen Rinks   Number of children: Not on file   Years of education: 3   Highest education level: Not on file  Occupational History   Not on file  Tobacco Use   Smoking status: Never   Smokeless tobacco: Never  Vaping Use   Vaping Use: Never used  Substance and Sexual Activity   Alcohol use: Yes    Comment: occas   Drug use: No   Sexual activity: Yes    Birth control/protection: I.U.D.  Other  Topics Concern   Not on file  Social History Narrative   Married, 2 children.    Social Determinants of Health   Financial Resource Strain: Not on file  Food Insecurity: Not on file  Transportation Needs: Not on file  Physical Activity: Not on file  Stress: Not on file  Social Connections: Not on file   Family History  Problem Relation Age of Onset   Alcohol abuse Father    Diabetes Father    Arthritis Maternal Aunt    Arthritis Maternal Grandmother    COPD Maternal Grandmother    Thyroid disease Maternal Grandmother    Alcohol abuse Maternal Grandfather    Cancer Maternal Grandfather        liver, pancreatic   Cancer Paternal Grandmother        brain   Alcohol abuse Paternal Grandfather    Stroke Paternal Grandfather    Heart disease Neg Hx        early heart disease   Sudden death Neg Hx        cardiac   Cardiomyopathy Neg Hx     Rectal cancer Neg Hx    Stomach cancer Neg Hx    Esophageal cancer Neg Hx    Colon cancer Neg Hx    Outpatient Encounter Medications as of 06/13/2021  Medication Sig   albuterol (VENTOLIN HFA) 108 (90 Base) MCG/ACT inhaler Inhale 1-2 puffs into the lungs every 6 hours as needed   buPROPion (WELLBUTRIN XL) 150 MG 24 hr tablet Take 1 tablet by mouth daily   cetirizine (ZYRTEC) 10 MG tablet Take 10 mg by mouth 2 (two) times daily.  (Patient not taking: Reported on 03/23/2021)   clobetasol ointment (TEMOVATE) 1.61 % Apply 1 application topically 2 (two) times daily as needed (irritation).   dexamethasone (DECADRON) 1 MG tablet Take 23m at 11PM the night before Cortisol draw the next morning before 8AM.   hydrOXYzine (ATARAX/VISTARIL) 25 MG tablet TAKE 1 TABLET BY MOUTH 3 TIMES DAILY AS NEEDED (Patient not taking: Reported on 05/31/2021)   lisdexamfetamine (VYVANSE) 30 MG capsule TAKE 1 CAPSULE BY MOUTH ONCE A DAY   lisdexamfetamine (VYVANSE) 30 MG capsule Take 1 capsule by mouth daily   methocarbamol (ROBAXIN) 500 MG tablet TAKE 1 TABLET BY MOUTH EVERY 8 HOURS AS NEEDED FOR MUSCLE SPASMS   ondansetron (ZOFRAN-ODT) 4 MG disintegrating tablet DISSOLVE 1 TABLET BY MOUTH EVERY 8 HOURS AS NEEDED FOR NAUSEA OR VOMITING   Pramoxine-HC 1-2.5 % OINT APPLY TWO TIMES DAILY AS NEEDED (Patient not taking: Reported on 03/23/2021)   scopolamine (TRANSDERM-SCOP, 1.5 MG,) 1 MG/3DAYS Place 1 patch (1.5 mg total) onto the skin every 3 (three) days. (Patient not taking: Reported on 03/23/2021)   triamcinolone (KENALOG) 0.1 % APPLY A THIN LAYER TO THE AFFECTED AREA TWO TIMES DAILY (Patient not taking: Reported on 03/23/2021)   VYVANSE 10 MG capsule Take 10 mg by mouth daily as needed (extra focus).    No facility-administered encounter medications on file as of 06/13/2021.   ALLERGIES: Allergies  Allergen Reactions   Prozac [Fluoxetine Hcl] Nausea Only   Latex Rash    VACCINATION STATUS: Immunization History   Administered Date(s) Administered   DTaP 01/25/1988, 10/31/1988, 12/30/1991   Hepatitis B 06/14/1998, 08/02/1998, 12/13/1998   HiB (PRP-OMP) 01/01/1990   IPV 10/31/1988, 12/30/1991   Influenza,inj,Quad PF,6+ Mos 07/13/2015   Influenza-Unspecified 07/19/2016   MMR 03/02/2015, 05/16/2015   PFIZER(Purple Top)SARS-COV-2 Vaccination 09/21/2019, 10/12/2019   PPD Test 02/14/2015, 02/28/2015, 03/01/2016  Tdap 12/13/2011    HPI [name] is 35 y.o. female who presents today to discuss her lab work and imaging studies.     She is following in this clinic after she was seen in consultation for right-sided adrenal incidentaloma.  She was initially referred by  Sharion Balloon, FNP.  She is not on any intervention.  She was engaged in telehealth for follow-up of her recent low-dose overnight dexamethasone suppression test. She has no new complaints.  See notes from previous visit.  -She underwent repeat CT abdomen/pelvis on December 04, 2020 which confirmed 1.5 cm intermediate liver attenuating right adrenal nodule with features of adenoma.   Her previsit CT scan of abdomen with and without contrast on May 24, 2021 redemonstrated 1.5 x 2 cm right adrenal mass which shows contrast washout consistent with a benign adrenal adenoma.  The left adrenal gland is normal in appearance.  Her repeat 24-hour urine free cortisol is 43 down from 60 mcg.  Normal range for that is 6-42 mcg. After 1 mg dexamethasone overnight suppression, her next morning cortisol was 0.7 mcg per DL.   She denies hirsutism.  She has minimally fluctuating body weight. -She denies rapid weight changes.  She is a mother of 3 children status post bilateral ovary sparing hysterectomy in 2021. -She denies family history of endocrinopathies.  She is a non-smoker, uses alcohol only socially.  Review of Systems  Constitutional: +no recent major weight changes,  + intermittent subjective hyperthermia, no subjective  hypothermia   Objective:    Vitals with BMI 05/31/2021 03/23/2021 12/29/2020  Height $Remov'5\' 2"'WTBLqo$  $Remove'5\' 2"'CUomGIz$  $RemoveB'5\' 2"'LWPhVlVs$   Weight 175 lbs 3 oz 174 lbs 179 lbs  BMI 32.04 26.94 85.46  Systolic 270 350 093  Diastolic 86 79 86  Pulse 93 93 76    There were no vitals taken for this visit.  Wt Readings from Last 3 Encounters:  05/31/21 175 lb 3.2 oz (79.5 kg)  03/23/21 174 lb (78.9 kg)  12/29/20 179 lb (81.2 kg)    Physical Exam  Constitutional:  There is no height or weight on file to calculate BMI.,  not in acute distress, normal state of mind   CMP ( most recent) CMP     Component Value Date/Time   NA 141 03/23/2021 1321   K 4.2 03/23/2021 1321   CL 103 03/23/2021 1321   CO2 22 03/23/2021 1321   GLUCOSE 96 03/23/2021 1321   GLUCOSE 95 05/21/2020 1905   BUN 11 03/23/2021 1321   CREATININE 0.95 03/23/2021 1321   CALCIUM 9.3 03/23/2021 1321   PROT 7.1 03/23/2021 1321   ALBUMIN 4.3 03/23/2021 1321   AST 16 03/23/2021 1321   ALT 27 03/23/2021 1321   ALKPHOS 77 03/23/2021 1321   BILITOT 0.5 03/23/2021 1321   GFRNONAA >60 05/21/2020 1905   GFRAA >60 05/21/2020 1905    Lipid Panel ( most recent) Lipid Panel     Component Value Date/Time   CHOL 136 03/23/2021 1321   TRIG 87 03/23/2021 1321   HDL 42 03/23/2021 1321   CHOLHDL 3.2 03/23/2021 1321   LDLCALC 77 03/23/2021 1321   LABVLDL 17 03/23/2021 1321      Lab Results  Component Value Date   TSH 0.831 03/23/2021   TSH 1.560 06/21/2019   TSH 1.600 07/19/2015      Assessment & Plan:   1. Adrenal adenoma, right 2.  Glucose intolerance  -Her endocrine work-up rules out pheochromocytoma, primary hyperaldosteronism.  Her DHEA-sulfate  is normal.  Her 24-hour urine free cortisol is slightly elevated at 60 inconclusive for Cushing syndrome. - Based on these reviews, she has right adrenal incidentaloma measuring 1.5 cm, intermediately attenuating right adrenal nodule with features of adenoma.  Repeat CT demonstrates similar size and  washout consistent with benign adenoma. Urine so our 24-hour urine free cortisol was marginally elevated on 2 separate occasions her 1 mg overnight dexamethasone suppression test did not establish Cushing syndrome. She will not need immediate surgical intervention.  Her presentation may be consistent with pseudo-Cushing's syndrome.  -Her blood pressure is controlled at 126/86, previously 118/86, and 124/76.  She is not on any antihypertensive medications.  During prior visit, point-of-care A1c was 5.3% indicating absence of prediabetes/diabetes. She will return in 1 year with CMP, free plasma metanephrines as well as a.m. cortisol.  - she is advised to maintain close follow up with Sharion Balloon, FNP for primary care needs.    I spent 25 minutes in the care of the patient today including review of labs from Thyroid Function, CMP, and other relevant labs ; imaging/biopsy records (current and previous including abstractions from other facilities); face-to-face time discussing  her lab results and symptoms, medications doses, her options of short and long term treatment based on the latest standards of care / guidelines;   and documenting the encounter.  [name]  participated in the discussions, expressed understanding, and voiced agreement with the above plans.  All questions were answered to her satisfaction. she is encouraged to contact clinic should she have any questions or concerns prior to her return visit.   Follow up plan: Return in about 1 year (around 06/13/2022) for F/U with Pre-visit Labs.   Glade Lloyd, MD [facility] [address] [place], [place] [address] Phone: [phone]  Fax: [phone]     06/13/2021, 5:07 PM  This note was partially dictated with voice recognition software. Similar sounding words can be transcribed inadequately or may not  be corrected upon review.

## 2021-06-14 ENCOUNTER — Other Ambulatory Visit (HOSPITAL_COMMUNITY): Payer: Self-pay

## 2021-06-14 MED ORDER — VYVANSE 30 MG PO CAPS
ORAL_CAPSULE | ORAL | 0 refills | Status: DC
  Filled 2021-06-14: qty 30, 30d supply, fill #0

## 2021-07-02 DIAGNOSIS — F902 Attention-deficit hyperactivity disorder, combined type: Secondary | ICD-10-CM | POA: Diagnosis not present

## 2021-07-02 DIAGNOSIS — Z79899 Other long term (current) drug therapy: Secondary | ICD-10-CM | POA: Diagnosis not present

## 2021-07-04 DIAGNOSIS — F902 Attention-deficit hyperactivity disorder, combined type: Secondary | ICD-10-CM | POA: Diagnosis not present

## 2021-07-09 ENCOUNTER — Other Ambulatory Visit (HOSPITAL_COMMUNITY): Payer: Self-pay

## 2021-07-09 MED ORDER — VYVANSE 30 MG PO CAPS
ORAL_CAPSULE | ORAL | 0 refills | Status: DC
  Filled 2021-07-09 – 2021-07-11 (×2): qty 30, 30d supply, fill #0

## 2021-07-11 ENCOUNTER — Other Ambulatory Visit (HOSPITAL_COMMUNITY): Payer: Self-pay

## 2021-07-20 ENCOUNTER — Other Ambulatory Visit (HOSPITAL_COMMUNITY): Payer: Self-pay

## 2021-07-20 MED ORDER — VYVANSE 10 MG PO CAPS
ORAL_CAPSULE | ORAL | 0 refills | Status: AC
  Filled 2021-07-20: qty 30, 30d supply, fill #0

## 2021-07-23 ENCOUNTER — Encounter: Payer: Self-pay | Admitting: Nurse Practitioner

## 2021-07-23 ENCOUNTER — Other Ambulatory Visit (HOSPITAL_COMMUNITY): Payer: Self-pay

## 2021-07-23 ENCOUNTER — Ambulatory Visit: Payer: 59 | Admitting: Nurse Practitioner

## 2021-07-23 DIAGNOSIS — N898 Other specified noninflammatory disorders of vagina: Secondary | ICD-10-CM | POA: Diagnosis not present

## 2021-07-23 MED ORDER — FLUCONAZOLE 150 MG PO TABS
150.0000 mg | ORAL_TABLET | Freq: Once | ORAL | 0 refills | Status: AC
Start: 2021-07-23 — End: 2021-07-24
  Filled 2021-07-23: qty 1, 1d supply, fill #0

## 2021-07-23 MED ORDER — FLUCONAZOLE 150 MG PO TABS
150.0000 mg | ORAL_TABLET | Freq: Once | ORAL | 0 refills | Status: AC
  Filled 2021-07-23: qty 1, 1d supply, fill #0

## 2021-07-23 NOTE — Progress Notes (Signed)
   Virtual Visit  Note Due to COVID-19 pandemic this visit was conducted virtually. This visit type was conducted due to national recommendations for restrictions regarding the COVID-19 Pandemic (e.g. social distancing, sheltering in place) in an effort to limit this patient's exposure and mitigate transmission in our community. All issues noted in this document were discussed and addressed.  A physical exam was not performed with this format.  I connected with [name] on 07/23/21 at 10:10 AM by telephone and verified that I am speaking with the correct person using two identifiers. [name] is currently located at home during visit. The provider, Ivy Lynn, NP is located in their office at time of visit.  I discussed the limitations, risks, security and privacy concerns of performing an evaluation and management service by telephone and the availability of in person appointments. I also discussed with the patient that there may be a patient responsible charge related to this service. The patient expressed understanding and agreed to proceed.   History and Present Illness:  Vaginal Discharge The patient's primary symptoms include vaginal discharge. This is a new problem. Episode onset: the past 3 days. The problem has been unchanged. The patient is experiencing no pain. Pertinent negatives include no back pain, constipation, dysuria, fever, flank pain or headaches. The vaginal discharge was milky, thick and copious. There has been no bleeding. Nothing aggravates the symptoms. She has tried nothing for the symptoms. She is sexually active. She uses hysterectomy for contraception.     Review of Systems  Constitutional:  Negative for fever.  Gastrointestinal:  Negative for constipation.  Genitourinary:  Positive for vaginal discharge. Negative for dysuria and flank pain.  Musculoskeletal:  Negative for back pain.  Neurological:  Negative for headaches.     Observations/Objective: Televisit patient not in distress.  Assessment and Plan: Take medication as prescribed -Fluconazole 150 mg tablet by mouth once. -Repeat dose 3 days if symptoms not resolved.  Follow Up Instructions: Follow-up with worsening unresolved symptoms.    I discussed the assessment and treatment plan with the patient. The patient was provided an opportunity to ask questions and all were answered. The patient agreed with the plan and demonstrated an understanding of the instructions.   The patient was advised to call back or seek an in-person evaluation if the symptoms worsen or if the condition fails to improve as anticipated.  The above assessment and management plan was discussed with the patient. The patient verbalized understanding of and has agreed to the management plan. Patient is aware to call the clinic if symptoms persist or worsen. Patient is aware when to return to the clinic for a follow-up visit. Patient educated on when it is appropriate to go to the emergency department.   Time call ended: 10:20 AM  I provided 10 minutes of  non face-to-face time during this encounter.    Ivy Lynn, NP

## 2021-07-23 NOTE — Assessment & Plan Note (Addendum)
Take medication as prescribed -Fluconazole 150 mg tablet by mouth once. -Repeat dose 3 days if symptoms not resolved.  Rx sent to pharmacy.  Education provided to patient.

## 2021-08-13 ENCOUNTER — Other Ambulatory Visit (HOSPITAL_COMMUNITY): Payer: Self-pay

## 2021-08-13 MED ORDER — VYVANSE 30 MG PO CAPS
ORAL_CAPSULE | ORAL | 0 refills | Status: DC
  Filled 2021-08-13: qty 30, 30d supply, fill #0

## 2021-09-10 ENCOUNTER — Other Ambulatory Visit (HOSPITAL_COMMUNITY): Payer: Self-pay

## 2021-09-25 ENCOUNTER — Other Ambulatory Visit (HOSPITAL_COMMUNITY): Payer: Self-pay

## 2021-09-26 ENCOUNTER — Other Ambulatory Visit (HOSPITAL_COMMUNITY): Payer: Self-pay

## 2021-09-26 MED ORDER — VYVANSE 30 MG PO CAPS
ORAL_CAPSULE | ORAL | 0 refills | Status: DC
  Filled 2021-09-26: qty 30, 30d supply, fill #0

## 2021-10-03 ENCOUNTER — Other Ambulatory Visit (HOSPITAL_COMMUNITY): Payer: Self-pay

## 2021-10-03 DIAGNOSIS — F902 Attention-deficit hyperactivity disorder, combined type: Secondary | ICD-10-CM | POA: Diagnosis not present

## 2021-10-03 DIAGNOSIS — Z79899 Other long term (current) drug therapy: Secondary | ICD-10-CM | POA: Diagnosis not present

## 2021-10-03 MED ORDER — JORNAY PM 100 MG PO CP24
100.0000 mg | ORAL_CAPSULE | Freq: Every evening | ORAL | 0 refills | Status: AC
  Filled 2021-10-03: qty 30, 30d supply, fill #0

## 2021-10-30 ENCOUNTER — Other Ambulatory Visit (HOSPITAL_COMMUNITY): Payer: Self-pay

## 2021-10-30 MED ORDER — AZITHROMYCIN 250 MG PO TABS
ORAL_TABLET | ORAL | 1 refills | Status: DC
  Filled 2021-10-30: qty 12, 10d supply, fill #0

## 2021-11-02 ENCOUNTER — Other Ambulatory Visit (HOSPITAL_COMMUNITY): Payer: Self-pay

## 2021-11-02 ENCOUNTER — Encounter: Payer: Self-pay | Admitting: Family

## 2021-11-02 ENCOUNTER — Ambulatory Visit (INDEPENDENT_AMBULATORY_CARE_PROVIDER_SITE_OTHER): Payer: 59 | Admitting: Family

## 2021-11-02 DIAGNOSIS — B3731 Acute candidiasis of vulva and vagina: Secondary | ICD-10-CM | POA: Diagnosis not present

## 2021-11-02 MED ORDER — TERCONAZOLE 0.4 % VA CREA
1.0000 | TOPICAL_CREAM | Freq: Every day | VAGINAL | 0 refills | Status: AC
  Filled 2021-11-02: qty 45, 5d supply, fill #0

## 2021-11-02 MED ORDER — FLUCONAZOLE 150 MG PO TABS
150.0000 mg | ORAL_TABLET | ORAL | 0 refills | Status: AC | PRN
  Filled 2021-11-02: qty 3, 9d supply, fill #0

## 2021-11-02 NOTE — Progress Notes (Signed)
Virtual Visit Consent   [name], you are scheduled for a virtual visit with a [facility] provider today.     Just as with appointments in the office, your consent must be obtained to participate.  Your consent will be active for this visit and any virtual visit you may have with one of our providers in the next 365 days.     If you have a MyChart account, a copy of this consent can be sent to you electronically.  All virtual visits are billed to your insurance company just like a traditional visit in the office.    As this is a virtual visit, video technology does not allow for your provider to perform a traditional examination.  This may limit your provider's ability to fully assess your condition.  If your provider identifies any concerns that need to be evaluated in person or the need to arrange testing (such as labs, EKG, etc.), we will make arrangements to do so.     Although advances in technology are sophisticated, we cannot ensure that it will always work on either your end or our end.  If the connection with a video visit is poor, the visit may have to be switched to a telephone visit.  With either a video or telephone visit, we are not always able to ensure that we have a secure connection.     I need to obtain your verbal consent now.   Are you willing to proceed with your visit today?    [name] has provided verbal consent on 11/02/2021 for a virtual visit (video or telephone).   [name], FNP   Date: 11/02/2021 10:44 AM   Virtual Visit via Video Note   I, [name], connected with  [name]  ([id number], 1986-06-03) on 11/02/21 at 10:55 AM EST by a video-enabled telemedicine application and verified that I am speaking with the correct person using two identifiers.  Location: Patient: Virtual Visit Location Patient: Other: doctor's office Provider: Virtual Visit Location Provider: Office/Clinic   I discussed the limitations of evaluation and management  by telemedicine and the availability of in person appointments. The patient expressed understanding and agreed to proceed.    History of Present Illness: [name] is a 36 y.o. who identifies as a female who was assigned female at birth, and is being seen today for vaginal itching.  HPI: Vaginal Itching The patient's primary symptoms include genital itching and vaginal discharge. The patient's pertinent negatives include no genital odor. This is a new problem. The current episode started in the past 7 days. The problem occurs intermittently. The vaginal discharge was white. She has tried nothing for the symptoms. The treatment provided mild relief.   Problems:  Patient Active Problem List   Diagnosis Date Noted   Vaginal discharge 07/23/2021   Hyperglycemia 12/29/2020   Adrenal adenoma, right 11/24/2020   Right adrenal mass ([place]) 11/20/2020   Status post laparoscopic assisted vaginal hysterectomy (LAVH) 02/24/2020   Chronic venous insufficiency 12/23/2019   Erythrocytosis 07/13/2019   Swelling 06/12/2016   Attention deficit hyperactivity disorder (ADHD), predominantly inattentive type 05/22/2016   Obesity (BMI 30-39.9) 05/22/2016   Generalized anxiety disorder 07/19/2015   Stress incontinence 07/19/2015   Allergic rhinitis 02/14/2015   GERD (gastroesophageal reflux disease) 02/14/2015   Asthma, chronic 12/19/2014   Simple ovarian cyst 01/19/2014    Allergies:  Allergies  Allergen Reactions   Prozac [Fluoxetine Hcl] Nausea Only   Latex Rash   Medications:  Current  Outpatient Medications:    fluconazole (DIFLUCAN) 150 MG tablet, Take 1 tablet (150 mg total) by mouth every three (3) days as needed., Disp: 3 tablet, Rfl: 0   terconazole (TERAZOL 7) 0.4 % vaginal cream, Place 1 applicator vaginally at bedtime., Disp: 45 g, Rfl: 0   albuterol (VENTOLIN HFA) 108 (90 Base) MCG/ACT inhaler, Inhale 1-2 puffs into the lungs every 6 hours as needed, Disp: 18 g, Rfl: 0   azithromycin  (ZITHROMAX) 250 MG tablet, Take 2 tablets by mouth on day 1 then 1 tablet by mouth daily for days 2-5, Disp: 6 tablet, Rfl: 1   buPROPion (WELLBUTRIN XL) 150 MG 24 hr tablet, Take 1 tablet by mouth daily, Disp: 90 tablet, Rfl: 6   cetirizine (ZYRTEC) 10 MG tablet, Take 10 mg by mouth 2 (two) times daily.  (Patient not taking: Reported on 03/23/2021), Disp: , Rfl:    clobetasol ointment (TEMOVATE) 2.62 %, Apply 1 application topically 2 (two) times daily as needed (irritation)., Disp: , Rfl:    dexamethasone (DECADRON) 1 MG tablet, Take 1mg  at 11PM the night before Cortisol draw the next morning before 8AM., Disp: 1 tablet, Rfl: 0   lisdexamfetamine (VYVANSE) 10 MG capsule, Take 1 capsule by mouth daily in the afternoon as needed., Disp: 30 capsule, Rfl: 0   lisdexamfetamine (VYVANSE) 30 MG capsule, TAKE 1 CAPSULE BY MOUTH ONCE A DAY, Disp: 30 capsule, Rfl: 0   lisdexamfetamine (VYVANSE) 30 MG capsule, Take 1 capsule by mouth once daily, Disp: 30 capsule, Rfl: 0   methocarbamol (ROBAXIN) 500 MG tablet, TAKE 1 TABLET BY MOUTH EVERY 8 HOURS AS NEEDED FOR MUSCLE SPASMS, Disp: 30 tablet, Rfl: 2   Methylphenidate HCl ER, PM, (JORNAY PM) 100 MG CP24, Take 1 capsule by mouth every evening., Disp: 30 capsule, Rfl: 0   ondansetron (ZOFRAN-ODT) 4 MG disintegrating tablet, DISSOLVE 1 TABLET BY MOUTH EVERY 8 HOURS AS NEEDED FOR NAUSEA OR VOMITING, Disp: 20 tablet, Rfl: 0   scopolamine (TRANSDERM-SCOP, 1.5 MG,) 1 MG/3DAYS, Place 1 patch (1.5 mg total) onto the skin every 3 (three) days. (Patient not taking: Reported on 03/23/2021), Disp: 10 patch, Rfl: 1   VYVANSE 10 MG capsule, Take 10 mg by mouth daily as needed (extra focus). , Disp: , Rfl:   Observations/Objective: Patient is well-developed, well-nourished in no acute distress.  Resting comfortably  at home.  Head is normocephalic, atraumatic.  No labored breathing. Speech is clear and coherent with logical content.  Patient is alert and oriented at  baseline.    Assessment and Plan: 1. Vagina, candidiasis - fluconazole (DIFLUCAN) 150 MG tablet; Take 1 tablet (150 mg total) by mouth every three (3) days as needed.  Dispense: 3 tablet; Refill: 0 - terconazole (TERAZOL 7) 0.4 % vaginal cream; Place 1 applicator vaginally at bedtime.  Dispense: 45 g; Refill: 0  Keep clean and dry  Avoid scratching  Start diflucan  Follow up if symptoms worsen or do not improve   Follow Up Instructions: I discussed the assessment and treatment plan with the patient. The patient was provided an opportunity to ask questions and all were answered. The patient agreed with the plan and demonstrated an understanding of the instructions.  A copy of instructions were sent to the patient via MyChart unless otherwise noted below.     The patient was advised to call back or seek an in-person evaluation if the symptoms worsen or if the condition fails to improve as anticipated.  Time:  I spent  11 minutes with the patient via telehealth technology discussing the above problems/concerns.    [name], FNP

## 2021-11-05 ENCOUNTER — Other Ambulatory Visit (HOSPITAL_COMMUNITY): Payer: Self-pay

## 2021-11-19 ENCOUNTER — Other Ambulatory Visit (HOSPITAL_COMMUNITY): Payer: Self-pay

## 2021-11-19 MED ORDER — VYVANSE 30 MG PO CAPS
30.0000 mg | ORAL_CAPSULE | Freq: Every day | ORAL | 0 refills | Status: DC
  Filled 2021-11-19: qty 30, 30d supply, fill #0

## 2021-12-20 ENCOUNTER — Other Ambulatory Visit (HOSPITAL_COMMUNITY): Payer: Self-pay

## 2021-12-20 MED ORDER — VYVANSE 30 MG PO CAPS
ORAL_CAPSULE | ORAL | 0 refills | Status: DC
  Filled 2021-12-20: qty 30, 30d supply, fill #0

## 2021-12-31 ENCOUNTER — Other Ambulatory Visit (HOSPITAL_COMMUNITY): Payer: Self-pay

## 2021-12-31 DIAGNOSIS — Z79899 Other long term (current) drug therapy: Secondary | ICD-10-CM | POA: Diagnosis not present

## 2021-12-31 DIAGNOSIS — F902 Attention-deficit hyperactivity disorder, combined type: Secondary | ICD-10-CM | POA: Diagnosis not present

## 2022-01-29 ENCOUNTER — Other Ambulatory Visit (HOSPITAL_COMMUNITY): Payer: Self-pay

## 2022-01-29 MED ORDER — VYVANSE 30 MG PO CAPS
ORAL_CAPSULE | ORAL | 0 refills | Status: AC
  Filled 2022-01-29: qty 30, 30d supply, fill #0

## 2022-02-05 DIAGNOSIS — J0100 Acute maxillary sinusitis, unspecified: Secondary | ICD-10-CM | POA: Diagnosis not present

## 2022-02-05 DIAGNOSIS — J029 Acute pharyngitis, unspecified: Secondary | ICD-10-CM | POA: Diagnosis not present

## 2022-03-07 ENCOUNTER — Other Ambulatory Visit (HOSPITAL_COMMUNITY): Payer: Self-pay

## 2022-03-07 ENCOUNTER — Other Ambulatory Visit: Payer: Self-pay | Admitting: Family Medicine

## 2022-03-08 ENCOUNTER — Other Ambulatory Visit (HOSPITAL_COMMUNITY): Payer: Self-pay

## 2022-03-08 MED ORDER — ALBUTEROL SULFATE HFA 108 (90 BASE) MCG/ACT IN AERS
1.0000 | INHALATION_SPRAY | Freq: Four times a day (QID) | RESPIRATORY_TRACT | 0 refills | Status: DC | PRN
  Filled 2022-03-08: qty 18, 25d supply, fill #0

## 2022-03-18 ENCOUNTER — Other Ambulatory Visit (HOSPITAL_COMMUNITY): Payer: Self-pay

## 2022-03-18 MED ORDER — VYVANSE 60 MG PO CHEW
1.0000 | CHEWABLE_TABLET | Freq: Every day | ORAL | 0 refills | Status: AC
  Filled 2022-03-18: qty 30, 30d supply, fill #0

## 2022-03-18 MED ORDER — VYVANSE 10 MG PO CAPS
ORAL_CAPSULE | ORAL | 0 refills | Status: AC
  Filled 2022-03-18: qty 30, 30d supply, fill #0

## 2022-03-19 ENCOUNTER — Other Ambulatory Visit (HOSPITAL_COMMUNITY): Payer: Self-pay

## 2022-03-27 ENCOUNTER — Other Ambulatory Visit (HOSPITAL_COMMUNITY): Payer: Self-pay

## 2022-04-02 IMAGING — CT CT ANGIO CHEST
2 of 5 series · 18 of 46 positions shown · IV contrast (APPLIED)
Comparison: None.

CLINICAL DATA: Sharp back pain.

EXAM:
CT ANGIOGRAPHY CHEST WITH CONTRAST
TECHNIQUE: Multidetector CT imaging of the chest was performed using the
standard protocol during bolus administration of intravenous
contrast. Multiplanar CT image reconstructions and MIPs were
obtained to evaluate the vascular anatomy.
CONTRAST:  75mL OMNIPAQUE IOHEXOL 350 MG/ML SOLN

[Series 5: thins · axial · 0.71mm/px · z∈[-15,+215]mm · 15 of 252 slices shown]
[im 11/252  lung]
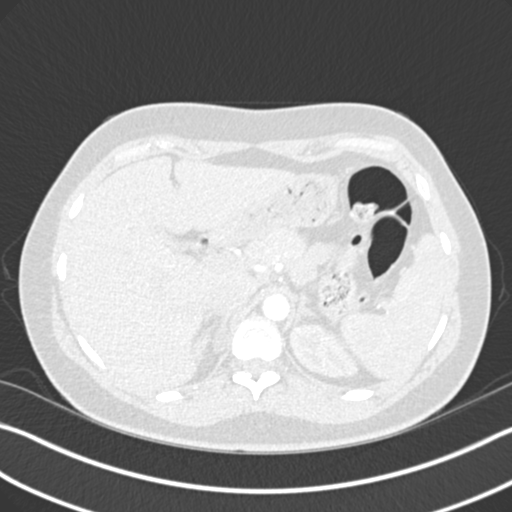
[im 33/252  soft-tissue]
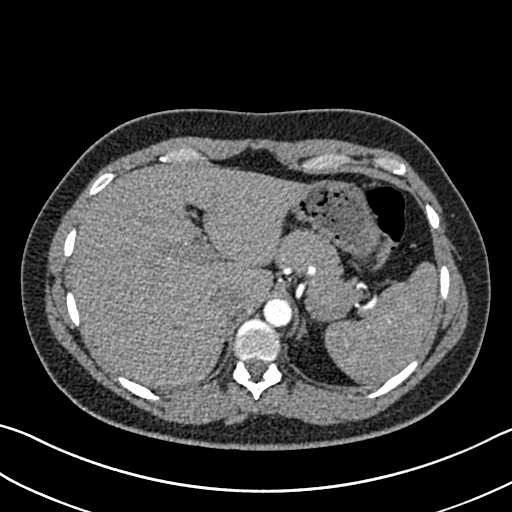
[im 44/252  lung]
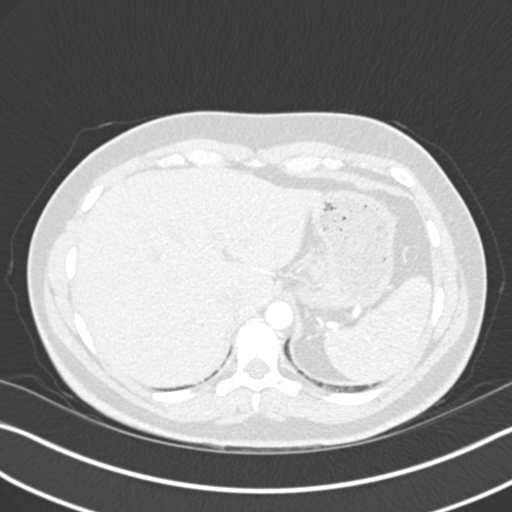
[im 66/252  soft-tissue]
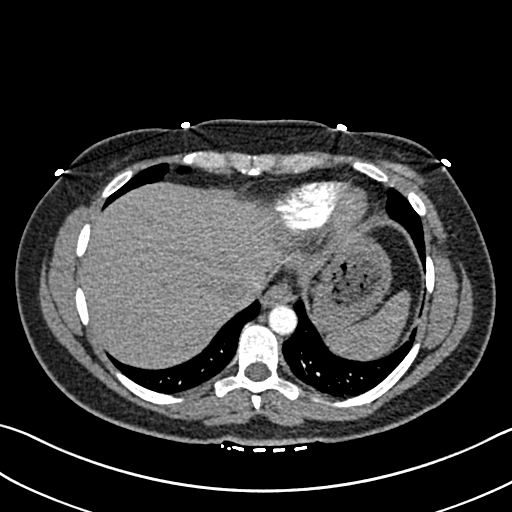
[im 77/252  lung]
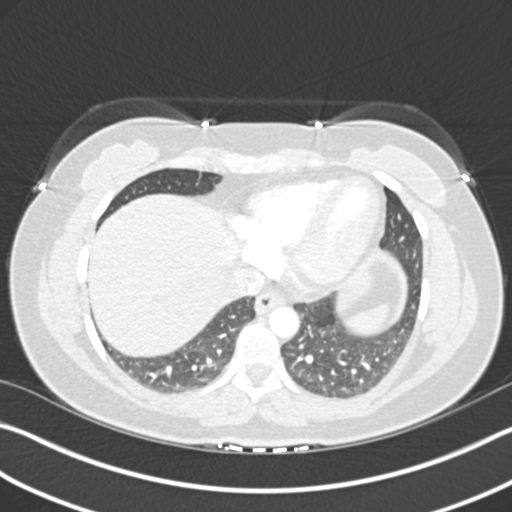
[im 99/252  soft-tissue]
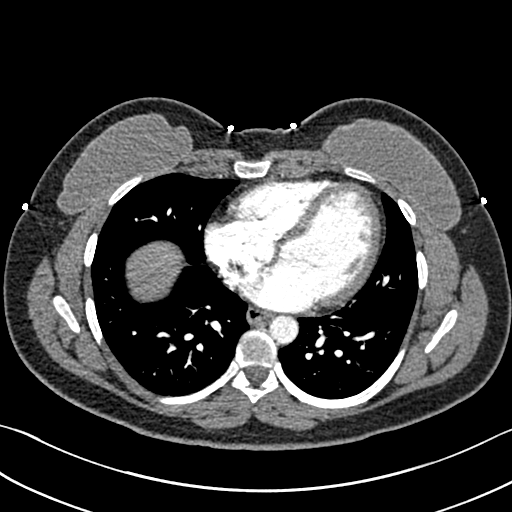
[im 110/252  lung]
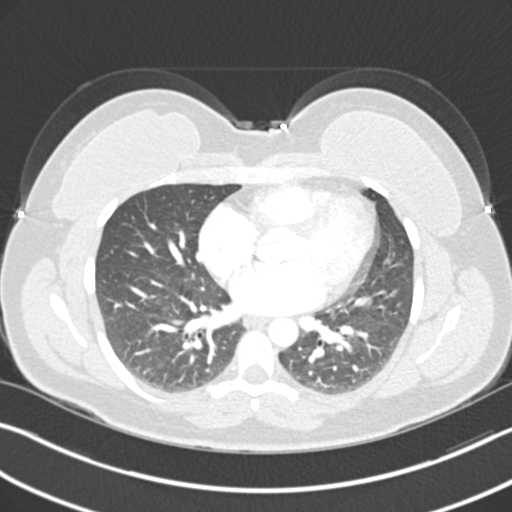
[im 131/252  soft-tissue]
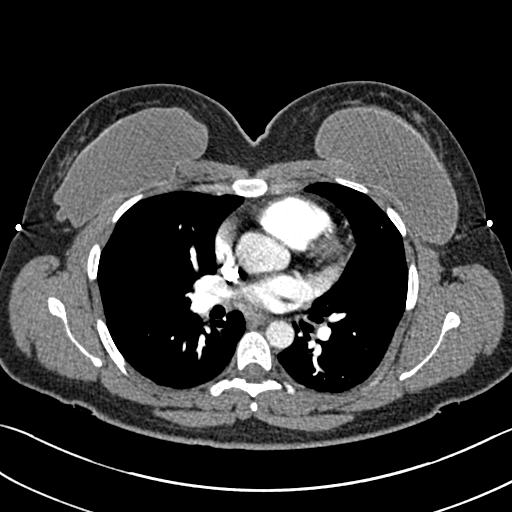
[im 142/252  lung]
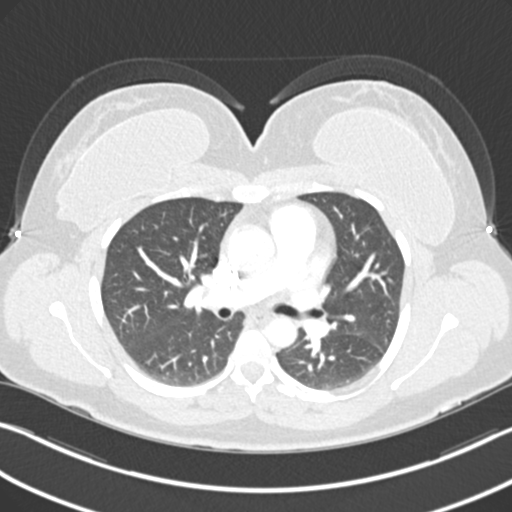
[im 153/252  soft-tissue]
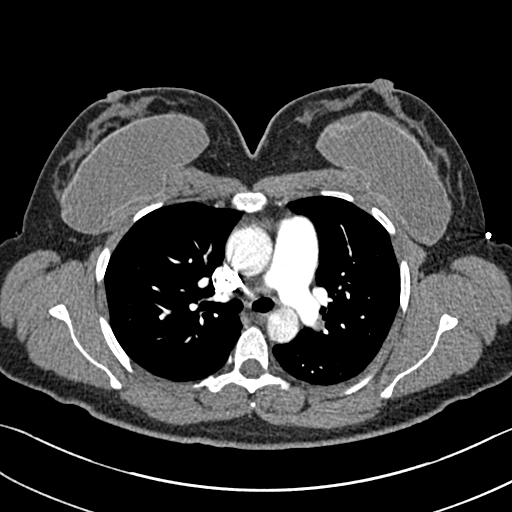
[im 175/252  lung]
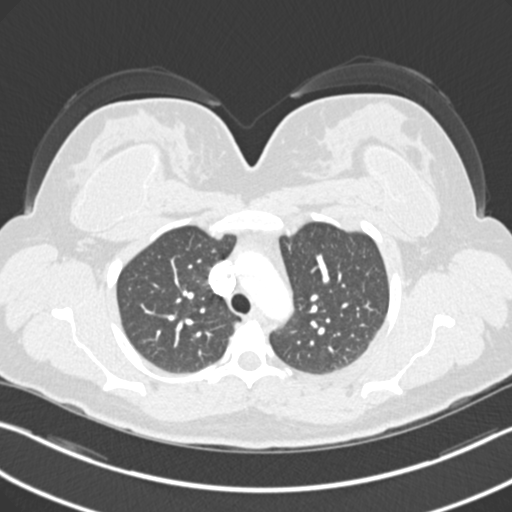
[im 186/252  soft-tissue]
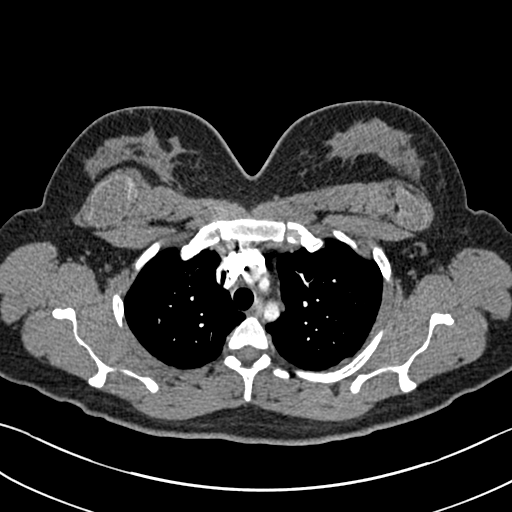
[im 208/252  lung]
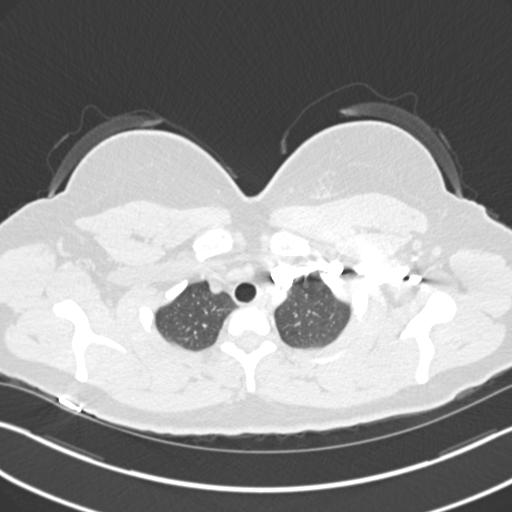
[im 219/252  soft-tissue]
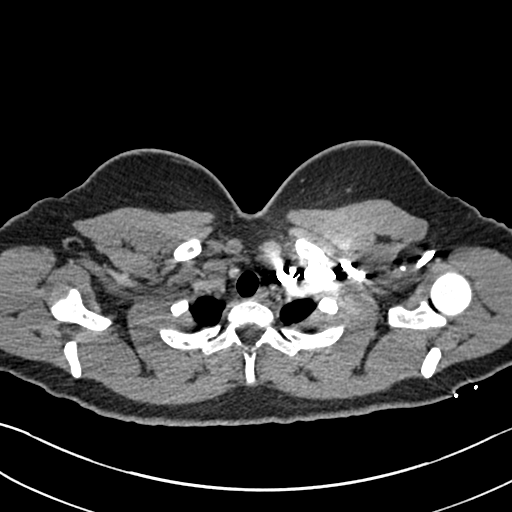
[im 241/252  lung]
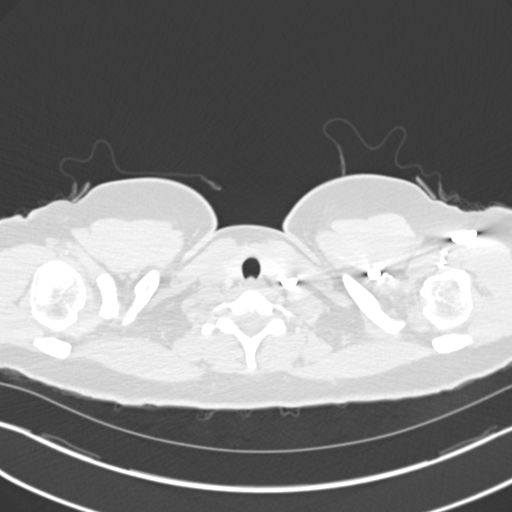

[Series 7: coronal mpr · coronal · 0.49mm/px · 3 of 80 slices shown]
[im 20/80  soft-tissue]
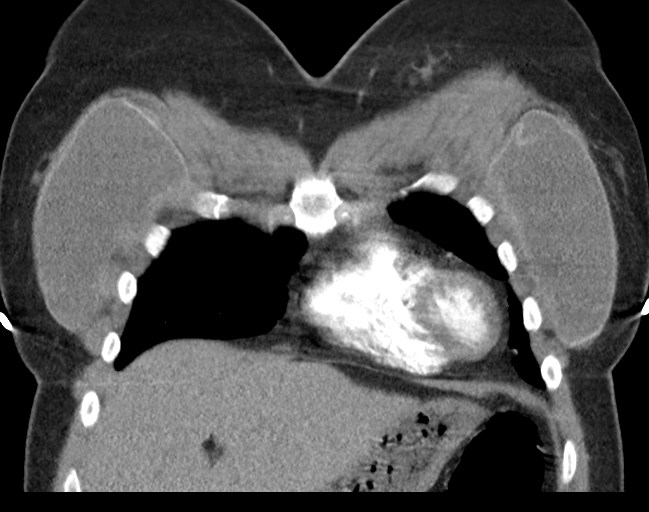
[im 40/80  soft-tissue]
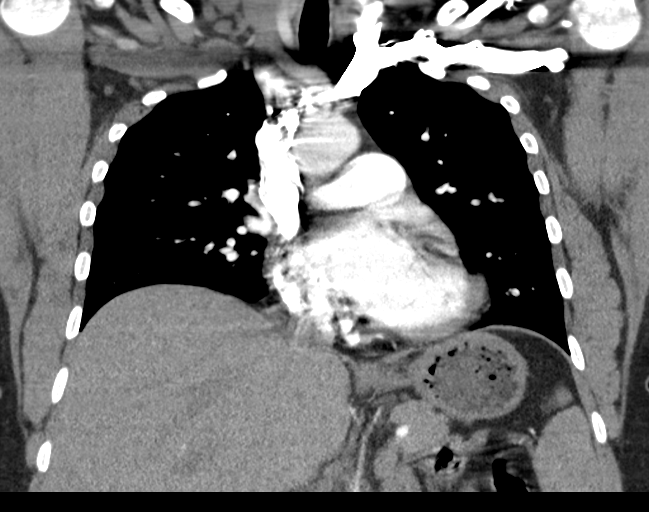
[im 60/80  soft-tissue]
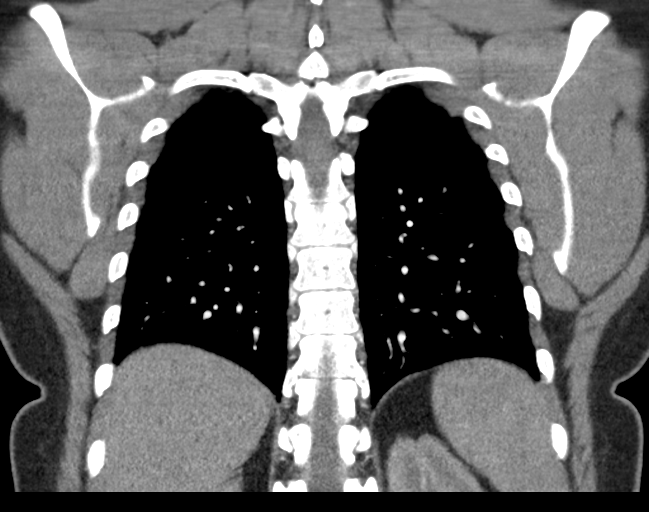

[18 of 46 positions shown; findings below may reference images not displayed]

FINDINGS: Cardiovascular: Satisfactory opacification of the pulmonary arteries
to the segmental level. No evidence of pulmonary embolism. Normal
heart size. No pericardial effusion.

Mediastinum/Nodes: No enlarged mediastinal, hilar, or axillary lymph
nodes. Thyroid gland, trachea, and esophagus demonstrate no
significant findings.

Lungs/Pleura: Lungs are clear. No pleural effusion or pneumothorax.

Upper Abdomen: A 1.9 cm x 1.3 cm low-attenuation right adrenal mass
is noted.

Musculoskeletal: Bilateral breast implants are seen. No acute or
significant osseous findings.

Review of the MIP images confirms the above findings.
IMPRESSION: 1. No CT evidence of pulmonary embolism or active cardiopulmonary
disease.
2. Bilateral breast implants.
3. Low-attenuation right adrenal mass which may represent an adrenal
adenoma.

## 2022-04-02 IMAGING — CT CT ABD-PELV W/ CM
2 of 4 series · 16 of 46 positions shown, 18 images · IV contrast (APPLIED)
Comparison: January 18, 2014

CLINICAL DATA: Left upper abdominal pain.

EXAM:
CT ABDOMEN AND PELVIS WITH CONTRAST
TECHNIQUE: Multidetector CT imaging of the abdomen and pelvis was performed
using the standard protocol following bolus administration of
intravenous contrast.
CONTRAST:  75mL OMNIPAQUE IOHEXOL 350 MG/ML SOLN

[Series 2: routine abd/pel with · axial · 0.83mm/px · z∈[-383,+72]mm · 13 of 101 slices shown, 15 images]
[im 5/101  soft-tissue]
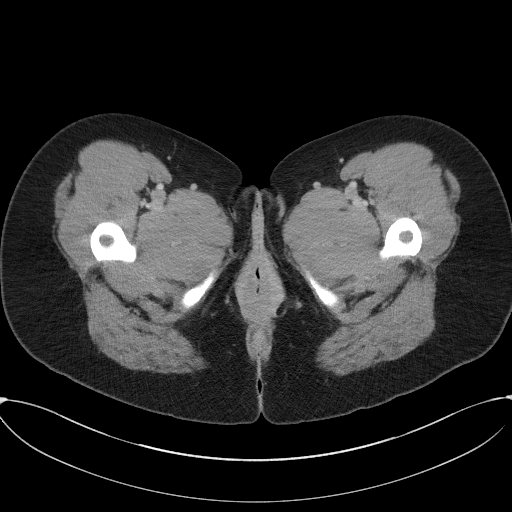
[im 5/101  bone]
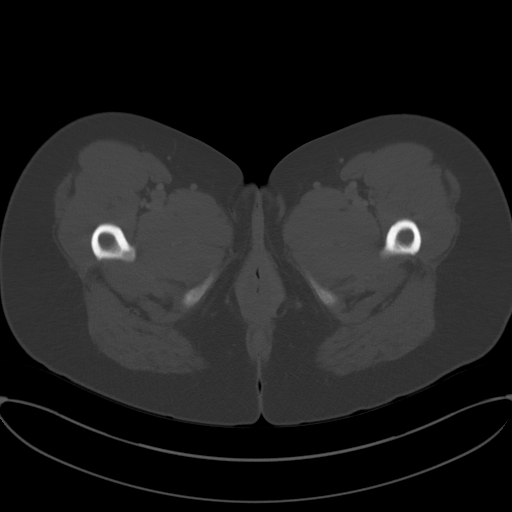
[im 14/101  soft-tissue]
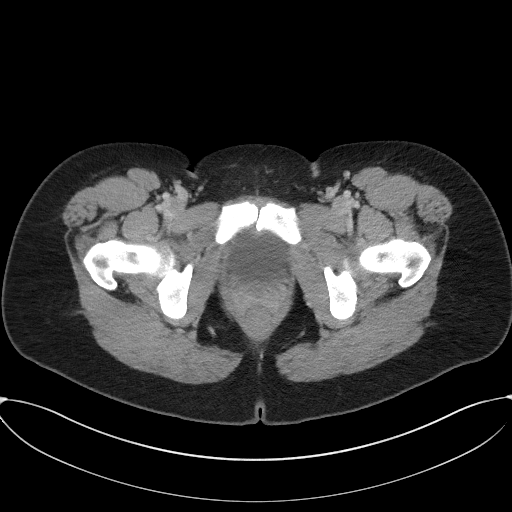
[im 23/101  soft-tissue]
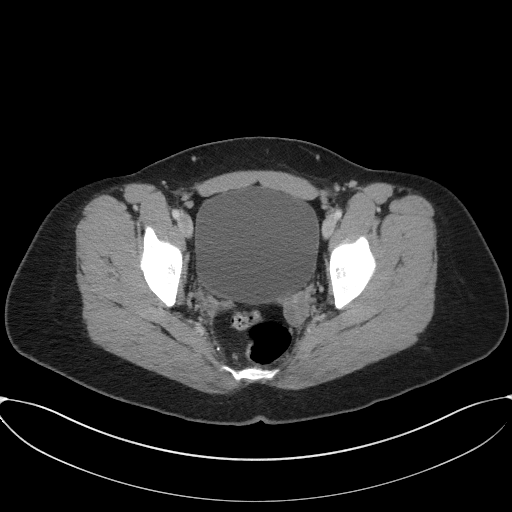
[im 28/101  soft-tissue]
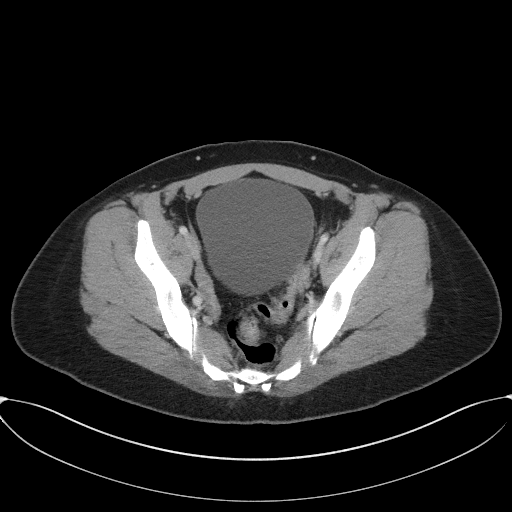
[im 37/101  soft-tissue]
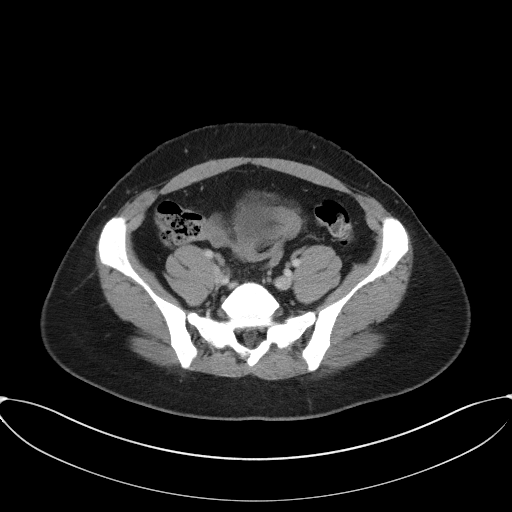
[im 41/101  soft-tissue]
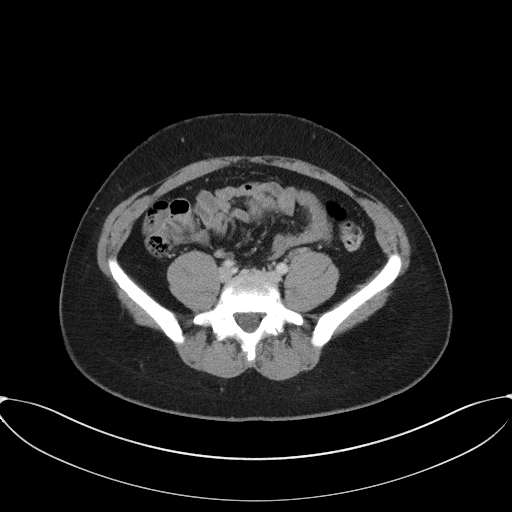
[im 51/101  soft-tissue]
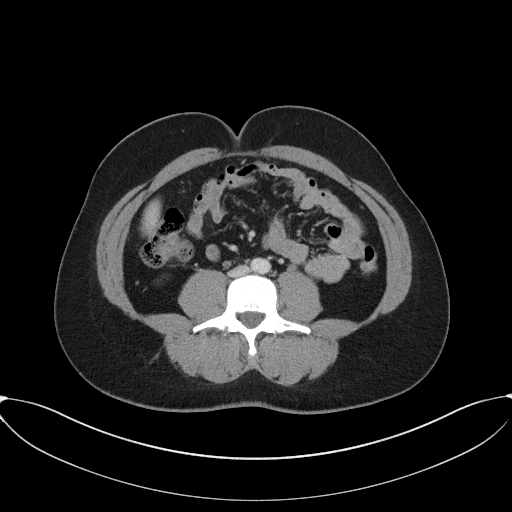
[im 60/101  soft-tissue]
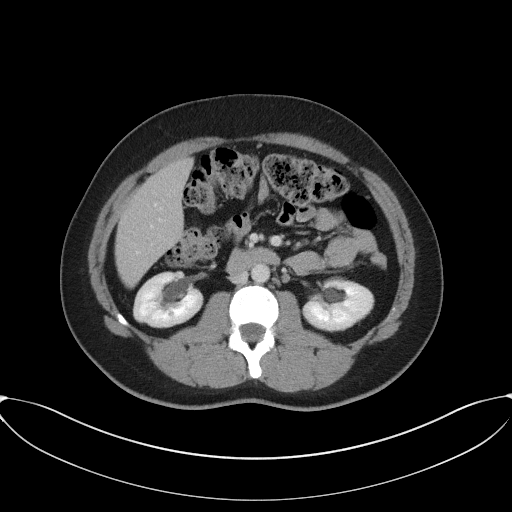
[im 64/101  soft-tissue]
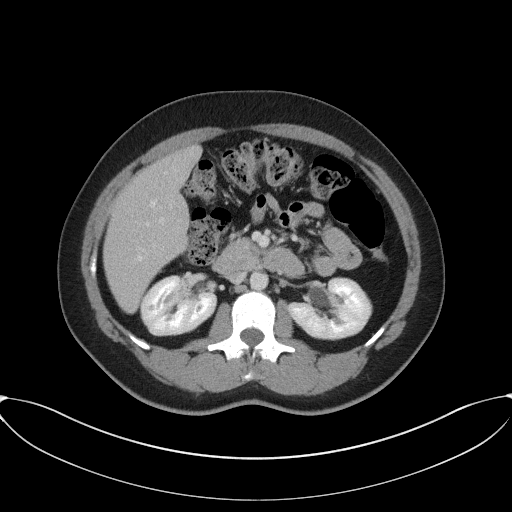
[im 64/101  bone]
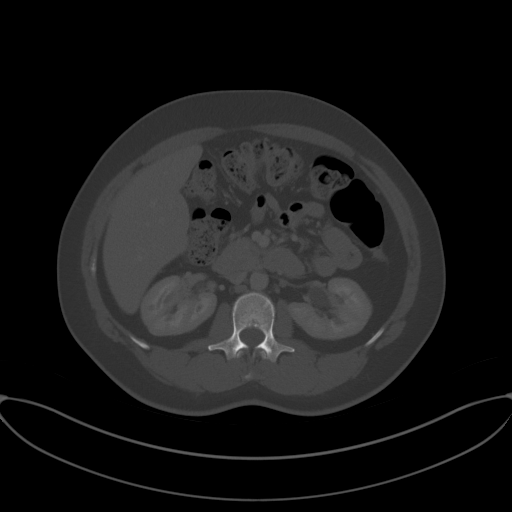
[im 73/101  soft-tissue]
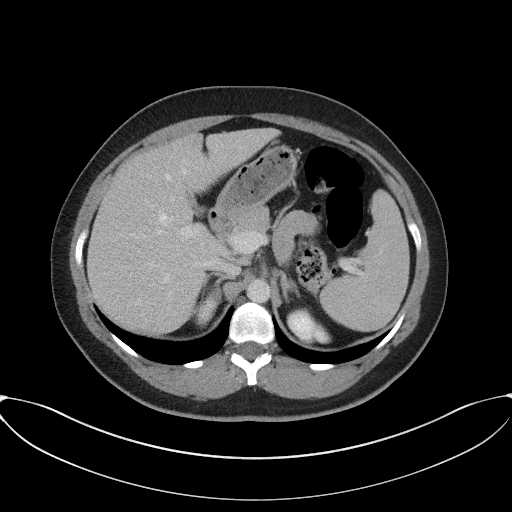
[im 78/101  soft-tissue]
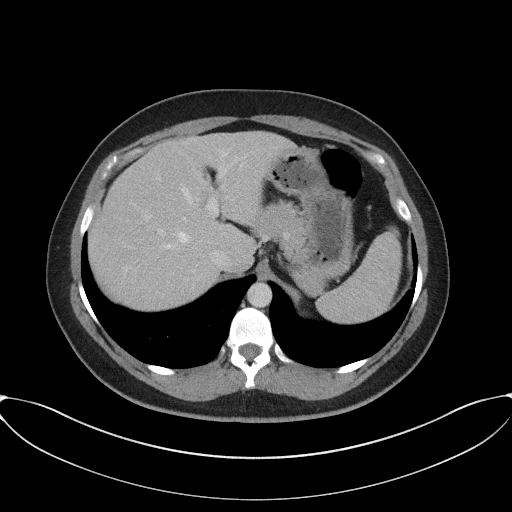
[im 87/101  soft-tissue]
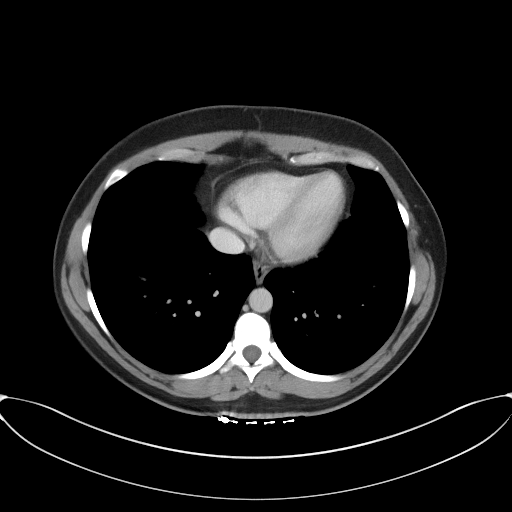
[im 96/101  soft-tissue]
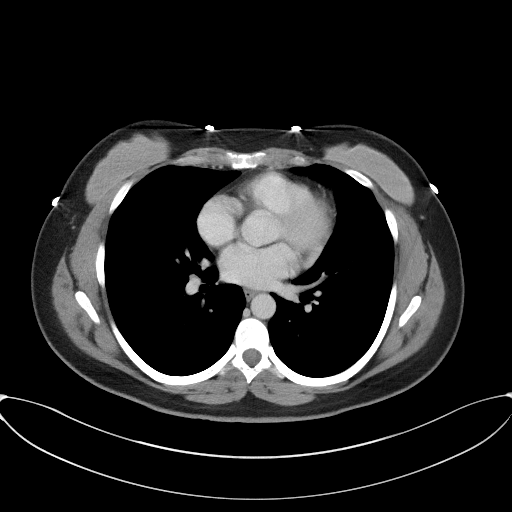

[Series 5: coronal st · coronal · 0.72mm/px · 3 of 93 slices shown]
[im 31/93  soft-tissue]
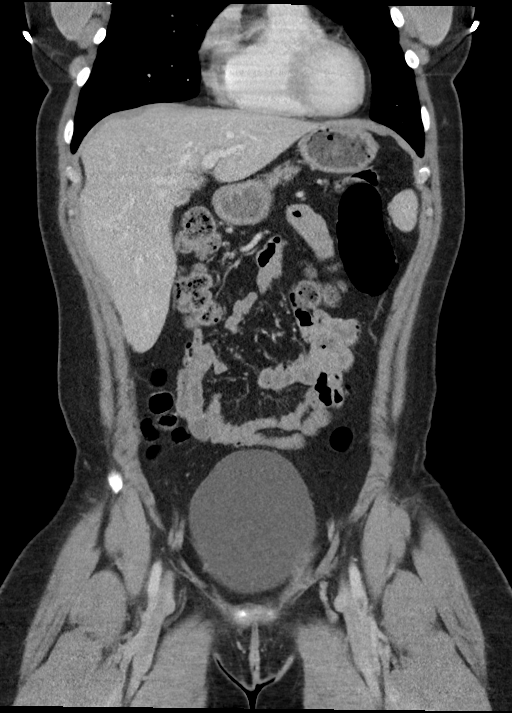
[im 41/93  soft-tissue]
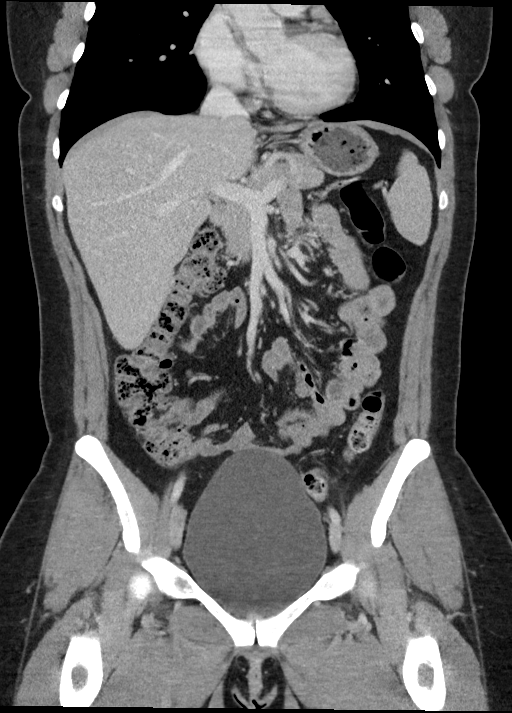
[im 52/93  soft-tissue]
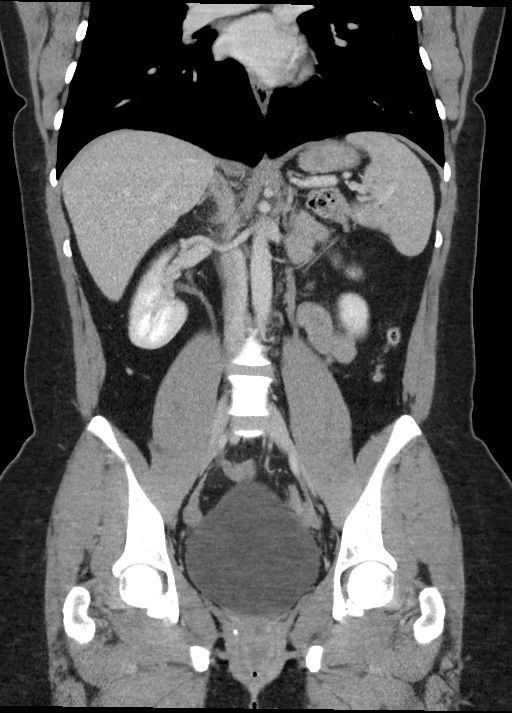

[16 of 46 positions shown; findings below may reference images not displayed]

FINDINGS: Lower chest: Bilateral breast implants are noted.

Hepatobiliary: No focal liver abnormality is seen. No gallstones,
gallbladder wall thickening, or biliary dilatation.

Pancreas: Unremarkable. No pancreatic ductal dilatation or
surrounding inflammatory changes.

Spleen: Normal in size without focal abnormality.

Adrenals/Urinary Tract: There is a 1.5 cm x 1.1 cm low-attenuation
right adrenal mass. The left adrenal gland is normal in appearance.
Kidneys are normal in size, without renal calculi or hydronephrosis.
Bilateral subcentimeter renal cysts are seen. There is mild
bilateral hydronephrosis without evidence of obstructing renal
stones. Bladder is unremarkable.

Stomach/Bowel: Stomach is within normal limits. The appendix is not
clearly identified. No evidence of bowel wall thickening,
distention, or inflammatory changes.

Vascular/Lymphatic: No significant vascular findings are present. No
enlarged abdominal or pelvic lymph nodes.

Reproductive: Status post hysterectomy. No adnexal masses.

Other: No abdominal wall hernia or abnormality. No abdominopelvic
ascites.

Musculoskeletal: No acute or significant osseous findings.
IMPRESSION: 1. Mild bilateral hydronephrosis without evidence of obstructing
renal stones.
2. Low-attenuation right adrenal mass which may represent an adrenal
adenoma.

## 2022-04-26 ENCOUNTER — Other Ambulatory Visit (HOSPITAL_COMMUNITY): Payer: Self-pay

## 2022-04-26 ENCOUNTER — Other Ambulatory Visit: Payer: Self-pay | Admitting: Family

## 2022-04-26 MED ORDER — ALBUTEROL SULFATE HFA 108 (90 BASE) MCG/ACT IN AERS
1.0000 | INHALATION_SPRAY | Freq: Four times a day (QID) | RESPIRATORY_TRACT | 0 refills | Status: AC | PRN
  Filled 2022-04-26 – 2022-05-31 (×2): qty 6.7, 25d supply, fill #0

## 2022-04-29 NOTE — Telephone Encounter (Signed)
LMVM about medication being refilled

## 2022-05-14 ENCOUNTER — Other Ambulatory Visit (HOSPITAL_COMMUNITY): Payer: Self-pay

## 2022-05-28 ENCOUNTER — Other Ambulatory Visit: Payer: Self-pay

## 2022-05-28 DIAGNOSIS — D3501 Benign neoplasm of right adrenal gland: Secondary | ICD-10-CM

## 2022-05-31 ENCOUNTER — Other Ambulatory Visit (HOSPITAL_COMMUNITY): Payer: Self-pay

## 2022-05-31 MED ORDER — BUPROPION HCL ER (XL) 150 MG PO TB24
150.0000 mg | ORAL_TABLET | Freq: Every day | ORAL | 5 refills | Status: AC
  Filled 2022-05-31: qty 90, 90d supply, fill #0

## 2022-06-13 ENCOUNTER — Ambulatory Visit: Payer: 59 | Admitting: "Endocrinology

## 2022-06-27 ENCOUNTER — Ambulatory Visit: Payer: 59 | Admitting: Family

## 2022-08-29 ENCOUNTER — Other Ambulatory Visit (HOSPITAL_BASED_OUTPATIENT_CLINIC_OR_DEPARTMENT_OTHER): Payer: Self-pay

## 2022-08-29 MED ORDER — LISDEXAMFETAMINE DIMESYLATE 30 MG PO CAPS
30.0000 mg | ORAL_CAPSULE | Freq: Every day | ORAL | 0 refills | Status: DC
  Filled 2022-08-29: qty 30, 30d supply, fill #0

## 2022-08-30 ENCOUNTER — Other Ambulatory Visit (HOSPITAL_BASED_OUTPATIENT_CLINIC_OR_DEPARTMENT_OTHER): Payer: Self-pay

## 2024-02-20 ENCOUNTER — Encounter: Payer: Self-pay | Admitting: Podiatry

## 2024-02-20 ENCOUNTER — Ambulatory Visit: Payer: PRIVATE HEALTH INSURANCE | Admitting: Podiatry

## 2024-02-20 DIAGNOSIS — L600 Ingrowing nail: Secondary | ICD-10-CM

## 2024-02-20 MED ORDER — CEPHALEXIN 500 MG PO CAPS: 500.0000 mg | ORAL_CAPSULE | Freq: Three times a day (TID) | ORAL | 0 refills | Status: DC

## 2024-02-20 NOTE — Progress Notes (Unsigned)
 Subjective:   Patient ID: [name], female   DOB: 38 y.o.   MRN: [id number]   HPI Chief Complaint  Patient presents with   Ingrown Toenail    RM#13 Left foot big toe nail infected X 3-4 days.Had been cleaning under toe nails with tweezers and then toe started to feel some discomfort next day.   38 year old female presents the office today for concerns of infected ingrown toenail left big toe worsening worse the last couple of days.  She states that she is a Engineer, civil (consulting) and she is try to clean it herself using antibiotic ointment however symptoms persist..  Tender with pressure.   Review of Systems  All other systems reviewed and are negative.   Past Medical History:  Diagnosis Date   Anxiety    Asthma    Concussion    Depression    Dysrhythmia    Family history of adverse reaction to anesthesia    GERD (gastroesophageal reflux disease)    Headache(784.0)    Migraines   Herpes    History of kidney stones    Keratosis    PONV (postoperative nausea and vomiting)    prolonged sedation   Postural orthostatic tachycardia syndrome    Tingling in extremities    occ    Past Surgical History:  Procedure Laterality Date   BLADDER SUSPENSION N/A 05/01/2016   Procedure: TRANSVAGINAL TAPE (TVT) PROCEDURE;  Surgeon: Cyd Dowse, MD;  Location: [facility];  Service: Gynecology;  Laterality: N/A;   BREAST ENHANCEMENT SURGERY     CYSTOCELE REPAIR N/A 05/01/2016   Procedure: ANTERIOR REPAIR (CYSTOCELE);  Surgeon: Cyd Dowse, MD;  Location: [facility];  Service: Gynecology;  Laterality: N/A;   CYSTOSCOPY N/A 02/24/2020   Procedure: POSSIBLE CYSTOSCOPY;  Surgeon: Margaretmary Shaver, MD;  Location: [facility];  Service: Gynecology;  Laterality: N/A;  possible   LAPAROSCOPIC VAGINAL HYSTERECTOMY WITH SALPINGECTOMY Bilateral 02/24/2020   Procedure: LAPAROSCOPIC ASSISTED VAGINAL HYSTERECTOMY WITH SALPINGECTOMY;  Surgeon: Margaretmary Shaver, MD;  Location: [facility];  Service: Gynecology;  Laterality: Bilateral;   RECTOCELE REPAIR N/A 02/24/2020   Procedure: POSTERIOR REPAIR (RECTOCELE) AND POSSIBLE ANTERIOR REPAIR;  Surgeon: Margaretmary Shaver, MD;  Location:  SURGERY CENTER;  Service: Gynecology;  Laterality: N/A;     Current Outpatient Medications:    albuterol  (VENTOLIN  HFA) 108 (90 Base) MCG/ACT inhaler, Inhale 1-2 puffs into the lungs every 6 (six) hours as needed for wheezing or shortness of breath. (NEEDS TO BE SEEN BEFORE NEXT REFILL), Disp: 6.7 g, Rfl: 0   azithromycin  (ZITHROMAX ) 250 MG tablet, Take 2 tablets by mouth on day 1 then 1 tablet by mouth daily for days 2-5, Disp: 6 tablet, Rfl: 1   buPROPion  (WELLBUTRIN  XL) 150 MG 24 hr tablet, Take 1 tablet by mouth daily, Disp: 90 tablet, Rfl: 5   cephALEXin (KEFLEX) 500 MG capsule, Take 1 capsule (500 mg total) by mouth 3 (three) times daily., Disp: 21 capsule, Rfl: 0   cetirizine (ZYRTEC) 10 MG tablet, Take 10 mg by mouth 2 (two) times daily., Disp: , Rfl:    clobetasol  ointment (TEMOVATE ) 0.05 %, Apply 1 application topically 2 (two) times daily as needed (irritation)., Disp: , Rfl:    dexamethasone  (DECADRON ) 1 MG tablet, Take 1mg  at 11PM the night before Cortisol draw the next morning before 8AM., Disp: 1 tablet, Rfl: 0   fluconazole  (DIFLUCAN ) 150 MG tablet, Take 1 tablet by mouth every three (3) days as needed., Disp: 3 tablet, Rfl:  0   lisdexamfetamine (VYVANSE ) 10 MG capsule, Take 1 capsule by mouth daily in the afternoon as needed., Disp: 30 capsule, Rfl: 0   lisdexamfetamine (VYVANSE ) 10 MG capsule, Take 1 capsule by mouth once daily in the afternoon as needed, Disp: 30 capsule, Rfl: 0   lisdexamfetamine (VYVANSE ) 30 MG capsule, Take 1 capsule by mouth daily, Disp: 30 capsule, Rfl: 0   Lisdexamfetamine Dimesylate  (VYVANSE ) 60 MG CHEW, Chew 1 tablet by mouth once daily, Disp: 30 tablet, Rfl: 0   Methylphenidate  HCl ER, PM, (JORNAY PM ) 100 MG CP24, Take 1 capsule by mouth  every evening., Disp: 30 capsule, Rfl: 0   ondansetron  (ZOFRAN -ODT) 4 MG disintegrating tablet, DISSOLVE 1 TABLET BY MOUTH EVERY 8 HOURS AS NEEDED FOR NAUSEA OR VOMITING, Disp: 20 tablet, Rfl: 0   scopolamine  (TRANSDERM-SCOP, 1.5 MG,) 1 MG/3DAYS, Place 1 patch (1.5 mg total) onto the skin every 3 (three) days., Disp: 10 patch, Rfl: 1   terconazole  (TERAZOL 7 ) 0.4 % vaginal cream, Insert 1 applicator vaginally at bedtime., Disp: 45 g, Rfl: 0   VYVANSE  10 MG capsule, Take 10 mg by mouth daily as needed (extra focus). , Disp: , Rfl:    lisdexamfetamine (VYVANSE ) 30 MG capsule, TAKE 1 CAPSULE BY MOUTH ONCE A DAY, Disp: 30 capsule, Rfl: 0  Allergies  Allergen Reactions   Prozac  [Fluoxetine  Hcl] Nausea Only   Latex Rash         Objective:  Physical Exam  General: AAO x3, NAD  Dermatological: Incurvation present to the left lateral hallux nail border and erythema more patient thinks that this toe infection.  There is no ascending cellulitis.  There is no fluctuation or crepitation.  There is no malodor.  No open lesions.  Vascular: Dorsalis Pedis artery and Posterior Tibial artery pedal pulses are 2/4 bilateral with immedate capillary fill time.  There is no pain with calf compression, swelling, warmth, erythema.   Neruologic: Grossly intact via light touch bilateral.   Musculoskeletal: Tenderness on the ingrown toenail.       Assessment:   Left lateral hallux ingrown toenail     Plan:  -Treatment options discussed including all alternatives, risks, and complications -Etiology of symptoms were discussed -At this time, the patient is requesting partial nail removal with chemical matricectomy to the symptomatic portion of the nail. Risks and complications were discussed with the patient for which they understand and written consent was obtained. Under sterile conditions a total of 3 mL of a mixture of 2% lidocaine  plain and 0.5% Marcaine  plain was infiltrated in a hallux block fashion.  Once anesthetized, the skin was prepped in sterile fashion. A tourniquet was then applied. Next the lateral aspect of hallux nail border was then sharply excised making sure to remove the entire offending nail border. Once the nails were ensured to be removed area was debrided and the underlying skin was intact. There is no purulence identified in the procedure. Next phenol was then applied under standard conditions and copiously irrigated.  Silvadene was applied. A dry sterile dressing was applied. After application of the dressing the tourniquet was removed and there is found to be an immediate capillary refill time to the digit. The patient tolerated the procedure well any complications. Post procedure instructions were discussed the patient for which he verbally understood. Discussed signs/symptoms of infection and directed to call the office immediately should any occur or go directly to the emergency room. In the meantime, encouraged to call the office with any questions,  concerns, changes symptoms. -Keflex  Charity Conch DPM

## 2024-02-20 NOTE — Patient Instructions (Signed)

## 2024-03-03 ENCOUNTER — Other Ambulatory Visit (HOSPITAL_BASED_OUTPATIENT_CLINIC_OR_DEPARTMENT_OTHER): Payer: Self-pay

## 2024-03-03 MED ORDER — ONDANSETRON 4 MG PO TBDP
4.0000 mg | ORAL_TABLET | Freq: Three times a day (TID) | ORAL | 3 refills | Status: AC | PRN
  Filled 2024-03-03: qty 30, 10d supply, fill #0

## 2024-03-15 ENCOUNTER — Other Ambulatory Visit (HOSPITAL_BASED_OUTPATIENT_CLINIC_OR_DEPARTMENT_OTHER): Payer: Self-pay

## 2024-03-16 ENCOUNTER — Ambulatory Visit: Payer: PRIVATE HEALTH INSURANCE | Admitting: Podiatry

## 2024-03-16 DIAGNOSIS — L600 Ingrowing nail: Secondary | ICD-10-CM | POA: Diagnosis not present

## 2024-03-16 NOTE — Patient Instructions (Signed)
 Continue soaking in epsom salts twice a day followed by antibiotic ointment and a band-aid. Can leave uncovered at night. Continue this until completely healed.  If the area has not healed in 2 weeks, call the office for follow-up appointment, or sooner if any problems arise.  Monitor for any signs/symptoms of infection. Call the office immediately if any occur or go directly to the emergency room. Call with any questions/concerns.

## 2024-03-21 NOTE — Progress Notes (Signed)
 Subjective:   Patient ID: [name], female   DOB: 38 y.o.   MRN: [id number]   HPI Chief Complaint  Patient presents with   Ingrown Toenail    RM#13 Left big toe ingrown follow up patient states is still sore.    38 year old female presents the office today for follow-up evaluation after Partial nail avulsion of left hallux.  She thinks that she is doing well but she is having some soreness.  She does try to soak in the foot.  She states that she did get her foot very muddy.   Review of Systems  All other systems reviewed and are negative.       Objective:  Physical Exam  General: AAO x3, NAD  Dermatological: Status post partial nail avulsion.  There is still some slight tenderness palpation along the procedure site but there is no drainage or pus noted.  There is no cellulitis.  Slight edema noted without any ascending cellulitis.  Vascular: Dorsalis Pedis artery and Posterior Tibial artery pedal pulses are 2/4 bilateral with immedate capillary fill time.  There is no pain with calf compression, swelling, warmth, erythema.   Neruologic: Grossly intact via light touch bilateral.   Musculoskeletal: Tenderness on the ingrown toenail.       Assessment:   Left lateral hallux ingrown toenail     Plan:  -Treatment options discussed including all alternatives, risks, and complications -Etiology of symptoms were discussed -Appears to be healing well.  Continue soaking and cover the antibiotic ointment and a bandage during the day but can leave the area open at nighttime.  Continue to keep area clean.  She still some residual tenderness and swelling but this is coming more from inflammation as opposed to infection.  If this is not resolved in next couple weeks to let me know or sooner if there is any changes or worsening.  Monitor for any signs or symptoms of infection and/or reoccurrence.  Return if symptoms worsen or fail to improve.  Donnice JONELLE Fees DPM
